# Patient Record
Sex: Female | Born: 1950 | Race: White | Hispanic: No | Marital: Married | Smoking: Never smoker
Health system: Southern US, Community
[De-identification: ages and names within clinical notes are randomized; demographics above are authoritative.]

## PROBLEM LIST (undated history)

## (undated) DIAGNOSIS — M549 Dorsalgia, unspecified: Secondary | ICD-10-CM

## (undated) DIAGNOSIS — Z8489 Family history of other specified conditions: Secondary | ICD-10-CM

## (undated) DIAGNOSIS — M4316 Spondylolisthesis, lumbar region: Secondary | ICD-10-CM

## (undated) DIAGNOSIS — K589 Irritable bowel syndrome without diarrhea: Secondary | ICD-10-CM

## (undated) DIAGNOSIS — I951 Orthostatic hypotension: Secondary | ICD-10-CM

## (undated) DIAGNOSIS — R5382 Chronic fatigue, unspecified: Secondary | ICD-10-CM

## (undated) DIAGNOSIS — F329 Major depressive disorder, single episode, unspecified: Secondary | ICD-10-CM

## (undated) DIAGNOSIS — J189 Pneumonia, unspecified organism: Secondary | ICD-10-CM

## (undated) DIAGNOSIS — F32A Depression, unspecified: Secondary | ICD-10-CM

## (undated) DIAGNOSIS — M797 Fibromyalgia: Secondary | ICD-10-CM

## (undated) DIAGNOSIS — G43909 Migraine, unspecified, not intractable, without status migrainosus: Secondary | ICD-10-CM

## (undated) DIAGNOSIS — M1990 Unspecified osteoarthritis, unspecified site: Secondary | ICD-10-CM

## (undated) DIAGNOSIS — C50919 Malignant neoplasm of unspecified site of unspecified female breast: Secondary | ICD-10-CM

## (undated) DIAGNOSIS — R112 Nausea with vomiting, unspecified: Secondary | ICD-10-CM

## (undated) DIAGNOSIS — M25569 Pain in unspecified knee: Secondary | ICD-10-CM

## (undated) DIAGNOSIS — J302 Other seasonal allergic rhinitis: Secondary | ICD-10-CM

## (undated) DIAGNOSIS — Z9889 Other specified postprocedural states: Secondary | ICD-10-CM

## (undated) DIAGNOSIS — I38 Endocarditis, valve unspecified: Secondary | ICD-10-CM

## (undated) DIAGNOSIS — R0600 Dyspnea, unspecified: Secondary | ICD-10-CM

## (undated) DIAGNOSIS — Z973 Presence of spectacles and contact lenses: Secondary | ICD-10-CM

## (undated) DIAGNOSIS — F419 Anxiety disorder, unspecified: Secondary | ICD-10-CM

## (undated) DIAGNOSIS — R609 Edema, unspecified: Secondary | ICD-10-CM

## (undated) DIAGNOSIS — G9332 Myalgic encephalomyelitis/chronic fatigue syndrome: Secondary | ICD-10-CM

## (undated) HISTORY — DX: Malignant neoplasm of unspecified site of unspecified female breast: C50.919

## (undated) HISTORY — PX: CHOLECYSTECTOMY: SHX55

## (undated) HISTORY — PX: OTHER SURGICAL HISTORY: SHX169

## (undated) HISTORY — PX: VARICOSE VEIN SURGERY: SHX832

## (undated) HISTORY — PX: ABDOMINAL HYSTERECTOMY: SHX81

## (undated) HISTORY — PX: HERNIA REPAIR: SHX51

## (undated) HISTORY — PX: OOPHORECTOMY: SHX86

---

## 2013-09-07 HISTORY — PX: BACK SURGERY: SHX140

## 2016-03-16 ENCOUNTER — Emergency Department (INDEPENDENT_AMBULATORY_CARE_PROVIDER_SITE_OTHER)
Admission: EM | Admit: 2016-03-16 | Discharge: 2016-03-16 | Disposition: A | Discharge status: Home / Self Care | Payer: PRIVATE HEALTH INSURANCE | Source: Home / Self Care | Attending: Family Medicine | Admitting: Family Medicine

## 2016-03-16 ENCOUNTER — Encounter: Payer: Self-pay | Admitting: *Deleted

## 2016-03-16 DIAGNOSIS — N390 Urinary tract infection, site not specified: Secondary | ICD-10-CM

## 2016-03-16 DIAGNOSIS — R300 Dysuria: Secondary | ICD-10-CM

## 2016-03-16 HISTORY — DX: Anxiety disorder, unspecified: F41.9

## 2016-03-16 HISTORY — DX: Myalgic encephalomyelitis/chronic fatigue syndrome: G93.32

## 2016-03-16 HISTORY — DX: Dorsalgia, unspecified: M54.9

## 2016-03-16 HISTORY — DX: Chronic fatigue, unspecified: R53.82

## 2016-03-16 HISTORY — DX: Migraine, unspecified, not intractable, without status migrainosus: G43.909

## 2016-03-16 HISTORY — DX: Major depressive disorder, single episode, unspecified: F32.9

## 2016-03-16 HISTORY — DX: Irritable bowel syndrome, unspecified: K58.9

## 2016-03-16 HISTORY — DX: Pain in unspecified knee: M25.569

## 2016-03-16 HISTORY — DX: Other seasonal allergic rhinitis: J30.2

## 2016-03-16 HISTORY — DX: Depression, unspecified: F32.A

## 2016-03-16 LAB — POCT URINALYSIS DIP (MANUAL ENTRY)
BILIRUBIN UA: NEGATIVE
GLUCOSE UA: NEGATIVE
Ketones, POC UA: NEGATIVE
NITRITE UA: NEGATIVE
Spec Grav, UA: 1.015 (ref 1.005–1.03)
Urobilinogen, UA: 0.2 (ref 0–1)
pH, UA: 6 (ref 5–8)

## 2016-03-16 MED ORDER — CEPHALEXIN 500 MG PO CAPS: 500.0000 mg | ORAL_CAPSULE | Freq: Two times a day (BID) | ORAL | Status: DC

## 2016-03-16 NOTE — ED Notes (Signed)
Pt c/o dysuria and urinary pressure x 2 wks with hematuria x today. Denies fever. No OTC meds.

## 2016-03-16 NOTE — ED Provider Notes (Signed)
CSN: [id number]     Arrival date & time 03/16/16  1349 History   First MD Initiated Contact with Patient 03/16/16 1420     Chief Complaint  Patient presents with  . Dysuria   (Consider location/radiation/quality/duration/timing/severity/associated sxs/prior Treatment) HPI Twanisha Mailey is a 65 y.o. female presenting to UC with c/o 2 weeks of gradually worsening dysuria, urinary frequency, lower back pain, and today started having hematuria. Hx of UTI only one other time about 3 months ago. Pt believed she was on azithromycin as the doses she took tapered down but she cannot recall for sure. She does have mild fatigue but also notes she just moved to area from [place], initially thought her fatigue was from the move.  Denies fever, chills, n/v/d.   Past Medical History  Diagnosis Date  . Migraine   . Back pain   . Knee pain   . Seasonal allergies   . Anxiety   . IBS (irritable bowel syndrome)   . Depression   . CFS (chronic fatigue syndrome)    Past Surgical History  Procedure Laterality Date  . Abdominal hysterectomy    . Oophorectomy    . Hernia repair    . Cholecystectomy     Family History  Problem Relation Age of Onset  . Mitral valve prolapse Mother   . Coronary aneurysm Father   . Diabetes Father    Social History  Substance Use Topics  . Smoking status: Never Smoker   . Smokeless tobacco: None  . Alcohol Use: No   OB History    No data available     Review of Systems  Constitutional: Positive for fatigue. Negative for fever and chills.  Gastrointestinal: Negative for nausea, vomiting, abdominal pain and diarrhea.  Genitourinary: Positive for dysuria, urgency, frequency, hematuria and pelvic pain ( "pressure"). Negative for flank pain, decreased urine volume, vaginal bleeding, vaginal discharge and vaginal pain.  Musculoskeletal: Positive for back pain. Negative for myalgias.    Allergies  Demerol and Augmentin  Home Medications   Prior to Admission  medications   Medication Sig Start Date End Date Taking? Authorizing Provider  ARIPiprazole (ABILIFY) 2 MG tablet Take 2 mg by mouth daily.   Yes Historical Provider, MD  BIOTIN 5000 PO Take by mouth.   Yes Historical Provider, MD  buPROPion (WELLBUTRIN SR) 150 MG 12 hr tablet Take 150 mg by mouth 2 (two) times daily.   Yes Historical Provider, MD  cetirizine (ZYRTEC) 10 MG tablet Take 10 mg by mouth daily.   Yes Historical Provider, MD  clonazePAM (KLONOPIN) 0.5 MG tablet Take 0.5 mg by mouth 2 (two) times daily as needed for anxiety.   Yes Historical Provider, MD  linaclotide (LINZESS) 145 MCG CAPS capsule Take 145 mcg by mouth daily before breakfast.   Yes Historical Provider, MD  metaxalone (SKELAXIN) 800 MG tablet Take 800 mg by mouth 3 (three) times daily.   Yes Historical Provider, MD  naproxen sodium (ANAPROX) 220 MG tablet Take 220 mg by mouth 2 (two) times daily with a meal.   Yes Historical Provider, MD  potassium chloride (MICRO-K) 10 MEQ CR capsule Take 10 mEq by mouth 2 (two) times daily.   Yes Historical Provider, MD  pregabalin (LYRICA) 150 MG capsule Take 150 mg by mouth 2 (two) times daily.   Yes Historical Provider, MD  topiramate (TOPAMAX) 50 MG tablet Take 50 mg by mouth 2 (two) times daily.   Yes Historical Provider, MD  traMADol Veatrice Bourbon) 50  MG tablet Take by mouth every 6 (six) hours as needed.   Yes Historical Provider, MD  triamterene-hydrochlorothiazide (DYAZIDE) 37.5-25 MG capsule Take 1 capsule by mouth daily.   Yes Historical Provider, MD  cephALEXin (KEFLEX) 500 MG capsule Take 1 capsule (500 mg total) by mouth 2 (two) times daily. For 7 days 03/16/16   Noland Fordyce, PA-C   Meds Ordered and Administered this Visit  Medications - No data to display  BP 112/73 mmHg  Pulse 79  Temp(Src) 97.7 F (36.5 C) (Oral)  Resp 18  Ht 5\' 6"  (1.676 m)  Wt 200 lb (90.719 kg)  BMI 32.30 kg/m2  SpO2 99% No data found.   Physical Exam  Constitutional: She is oriented to  person, place, and time. She appears well-developed and well-nourished. No distress.  HENT:  Head: Normocephalic and atraumatic.  Mouth/Throat: Oropharynx is clear and moist.  Eyes: EOM are normal.  Neck: Normal range of motion.  Cardiovascular: Normal rate, regular rhythm and normal heart sounds.   Pulmonary/Chest: Effort normal and breath sounds normal. No respiratory distress. She has no wheezes. She has no rales.  Abdominal: Soft. She exhibits no distension and no mass. There is no tenderness. There is no rebound, no guarding and no CVA tenderness.  Musculoskeletal: Normal range of motion.  Neurological: She is alert and oriented to person, place, and time.  Skin: Skin is warm and dry. She is not diaphoretic.  Psychiatric: She has a normal mood and affect. Her behavior is normal.  Nursing note and vitals reviewed.   ED Course  Procedures (including critical care time)  Labs Review Labs Reviewed  POCT URINALYSIS DIP (MANUAL ENTRY) - Abnormal; Notable for the following:    Clarity, UA cloudy (*)    Blood, UA moderate (*)    Protein Ur, POC trace (*)    Leukocytes, UA moderate (2+) (*)    All other components within normal limits  URINE CULTURE    Imaging Review No results found.    MDM   1. Dysuria   2. UTI (lower urinary tract infection)    Pt c/o 2 weeks of worsening UTI symptoms.  UA c/w UTI  Rx: keflex  Encouraged good hydration. May take OTC Azo. F/u in 4-5 days if not improving, sooner if worsening. Patient verbalized understanding and agreement with treatment plan.     Noland Fordyce, PA-C 03/16/16 1437

## 2016-03-16 NOTE — Discharge Instructions (Signed)
°

## 2016-03-19 ENCOUNTER — Telehealth: Payer: Self-pay | Admitting: *Deleted

## 2016-03-19 LAB — URINE CULTURE: Colony Count: >=100000

## 2016-03-19 NOTE — Telephone Encounter (Signed)
Callback: Pt reports she is much improved. Notified of UCX results, encouraged to complete antibiotic.

## 2016-03-31 DIAGNOSIS — R002 Palpitations: Secondary | ICD-10-CM | POA: Insufficient documentation

## 2016-03-31 DIAGNOSIS — R079 Chest pain, unspecified: Secondary | ICD-10-CM | POA: Insufficient documentation

## 2016-03-31 DIAGNOSIS — R0602 Shortness of breath: Secondary | ICD-10-CM | POA: Insufficient documentation

## 2016-04-01 DIAGNOSIS — M48061 Spinal stenosis, lumbar region without neurogenic claudication: Secondary | ICD-10-CM | POA: Insufficient documentation

## 2016-04-01 DIAGNOSIS — F339 Major depressive disorder, recurrent, unspecified: Secondary | ICD-10-CM | POA: Insufficient documentation

## 2016-04-01 DIAGNOSIS — M1712 Unilateral primary osteoarthritis, left knee: Secondary | ICD-10-CM | POA: Insufficient documentation

## 2016-04-01 DIAGNOSIS — F419 Anxiety disorder, unspecified: Secondary | ICD-10-CM | POA: Insufficient documentation

## 2016-04-01 DIAGNOSIS — R5382 Chronic fatigue, unspecified: Secondary | ICD-10-CM | POA: Insufficient documentation

## 2016-04-01 DIAGNOSIS — K581 Irritable bowel syndrome with constipation: Secondary | ICD-10-CM | POA: Insufficient documentation

## 2016-04-27 DIAGNOSIS — M797 Fibromyalgia: Secondary | ICD-10-CM | POA: Insufficient documentation

## 2016-07-14 ENCOUNTER — Ambulatory Visit (INDEPENDENT_AMBULATORY_CARE_PROVIDER_SITE_OTHER): Payer: PRIVATE HEALTH INSURANCE | Admitting: Podiatry

## 2016-07-14 ENCOUNTER — Encounter: Payer: Self-pay | Admitting: Podiatry

## 2016-07-14 DIAGNOSIS — M216X9 Other acquired deformities of unspecified foot: Secondary | ICD-10-CM

## 2016-07-14 DIAGNOSIS — Q828 Other specified congenital malformations of skin: Secondary | ICD-10-CM | POA: Diagnosis not present

## 2016-07-14 NOTE — Progress Notes (Signed)
   Subjective:    Patient ID: Lisa Morrison, female    DOB: [birth year], 65 y.o.   MRN: [id number]  HPI  65 year old female presents the office they for concerns of painful callus to both the left side worse than the right. She states that she just moved tear from [place] and she has been treated every 6 weeks for calluses. She states they've gotten very thick on the left side and is painful to walk. Denies any redness or drainage or swelling to her feet. No other complaints at this time.  Review of Systems  All other systems reviewed and are negative.      Objective:   Physical Exam General: AAO x3, NAD  Dermatological: On the left foot symmetrical 3 is a annular, deep hyperkeratotic lesion consistent with a porokeratosis. Upon debridement there is no underlying ulceration, drainage or any signs of infection. On the right foot there is diffuse thin hyperkeratotic tissue on the right foot submetatarsal 2/3. No underlying ulceration. Other open lesions or pre-ulcerative lesions.  Vascular: Dorsalis Pedis artery and Posterior Tibial artery pedal pulses are 2/4 bilateral with immedate capillary fill time.  There is no pain with calf compression, swelling, warmth, erythema.   Neruologic: Grossly intact via light touch bilateral. Vibratory intact via tuning fork bilateral. Protective threshold with Semmes Wienstein monofilament intact to all pedal sites bilateral.   Musculoskeletal: Prominent metatarsal heads plantarly with atrophy of the fat pad. No pain, crepitus, or limitation noted with foot and ankle range of motion bilateral. Muscular strength 5/5 in all groups tested bilateral.  Gait: Unassisted, Nonantalgic.      Assessment & Plan:  65 year old female symptomatically porokeratosis left foot due to prominent metatarsal heads. -Treatment options discussed including all alternatives, risks, and complications -Etiology of symptoms were discussed -Keratotic lesions were debrided 2  without, occasions or bleeding. On the left with area was cleaned and a pad was placed followed by salicylic acid and a bandage. Post procedure structures were discussed. Monitor for infection. Dispensed metatarsal offloading pads. She has urea cream at home and recommended to use this as well. -Follow-up as scheduled or sooner if any problems arise. In the meantime, encouraged to call the office with any questions, concerns, change in symptoms.   Celesta Gentile, DPM

## 2016-08-11 ENCOUNTER — Ambulatory Visit: Payer: PRIVATE HEALTH INSURANCE | Admitting: Podiatry

## 2016-09-08 ENCOUNTER — Encounter: Payer: Self-pay | Admitting: Podiatry

## 2016-09-08 ENCOUNTER — Ambulatory Visit (INDEPENDENT_AMBULATORY_CARE_PROVIDER_SITE_OTHER): Payer: PRIVATE HEALTH INSURANCE | Admitting: Podiatry

## 2016-09-08 DIAGNOSIS — Q828 Other specified congenital malformations of skin: Secondary | ICD-10-CM

## 2016-09-08 NOTE — Progress Notes (Signed)
Subjective: 66 year old female presents the office they for reoccurring calluses of the left foot. She had a cancer her last appointment and she had pneumonia. She recently moved. From [place] where she was given the callus trimmed every 4-5 weeks. She states that after calluses are trimmed she feels much better. She has tried changing shoes, padding without any significant relief. Denies any systemic complaints such as fevers, chills, nausea, vomiting. No acute changes since last appointment, and no other complaints at this time.   Objective: AAO x3, NAD DP/PT pulses palpable bilaterally, CRT less than 3 seconds Here is prominent metatarsal heads plantarly with atrophy of the fat pad. Hyperkeratotic tissue is present left foot submetatarsal 3. Upon debridement there is no underlying ulceration, drainage or any signs of infection. There is no other open lesions or pre-ulcerative lesions identified today. Tenderness over the hyperkeratotic lesion but no other areas of tenderness. No pain with calf compression, swelling, warmth, erythema  Assessment: Left foot porokeratosis  Plan: -All treatment options discussed with the patient including all alternatives, risks, complications.  -Lesion sharply debrided without complications or bleeding. Area was cleaned followed by pad and salinocaine. Post procedure instructions were discussed. Monitor for infection. -Follow-up in 4 weeks or sooner if needed. Call any questions or concerns.  Celesta Gentile, DPM

## 2016-09-30 DIAGNOSIS — M5136 Other intervertebral disc degeneration, lumbar region: Secondary | ICD-10-CM | POA: Insufficient documentation

## 2016-09-30 DIAGNOSIS — Z77090 Contact with and (suspected) exposure to asbestos: Secondary | ICD-10-CM | POA: Insufficient documentation

## 2016-09-30 DIAGNOSIS — E782 Mixed hyperlipidemia: Secondary | ICD-10-CM | POA: Insufficient documentation

## 2016-09-30 DIAGNOSIS — M51369 Other intervertebral disc degeneration, lumbar region without mention of lumbar back pain or lower extremity pain: Secondary | ICD-10-CM | POA: Insufficient documentation

## 2016-09-30 DIAGNOSIS — R739 Hyperglycemia, unspecified: Secondary | ICD-10-CM | POA: Insufficient documentation

## 2016-10-06 ENCOUNTER — Ambulatory Visit (INDEPENDENT_AMBULATORY_CARE_PROVIDER_SITE_OTHER): Payer: PRIVATE HEALTH INSURANCE | Admitting: Podiatry

## 2016-10-06 ENCOUNTER — Ambulatory Visit: Payer: PRIVATE HEALTH INSURANCE | Admitting: Podiatry

## 2016-10-06 ENCOUNTER — Encounter: Payer: Self-pay | Admitting: Podiatry

## 2016-10-06 DIAGNOSIS — Q828 Other specified congenital malformations of skin: Secondary | ICD-10-CM | POA: Diagnosis not present

## 2016-10-06 NOTE — Progress Notes (Signed)
Subjective: 66 year old female presents the office they for reoccurring calluses of the left foot.She denies any swelling or redness/drainage. Denies any systemic complaints such as fevers, chills, nausea, vomiting. No acute changes since last appointment, and no other complaints at this time.   Objective: AAO x3, NAD DP/PT pulses palpable bilaterally, CRT less than 3 seconds There is prominence of metatarsal heads plantarly with atrophy of the fat pad. Hyperkeratotic tissue is present left foot submetatarsal 3. Upon debridement there is no underlying ulceration, drainage or any signs of infection. There is no other open lesions or pre-ulcerative lesions identified today. Tenderness over the hyperkeratotic lesion but no other areas of tenderness. No pain with calf compression, swelling, warmth, erythema  Assessment: Left foot porokeratosis  Plan: -All treatment options discussed with the patient including all alternatives, risks, complications.  -Lesion sharply debrided without complications or bleeding. Area was cleaned followed by a pad and salinocaine was applied followed by a bandage. Post-procedure instructions disucssed.  -Daily foot inspection -Follow-up in 4 weeks or sooner if needed. Call any questions or concerns.  Celesta Gentile, DPM

## 2016-11-10 ENCOUNTER — Encounter: Payer: Self-pay | Admitting: Podiatry

## 2016-11-10 ENCOUNTER — Ambulatory Visit (INDEPENDENT_AMBULATORY_CARE_PROVIDER_SITE_OTHER): Payer: PRIVATE HEALTH INSURANCE | Admitting: Podiatry

## 2016-11-10 DIAGNOSIS — M79672 Pain in left foot: Secondary | ICD-10-CM | POA: Diagnosis not present

## 2016-11-10 DIAGNOSIS — Q828 Other specified congenital malformations of skin: Secondary | ICD-10-CM

## 2016-11-16 NOTE — Progress Notes (Signed)
Subjective: 66 year old female presents the office they for reoccurring calluses of the left foot.She denies any swelling or redness/drainage. Denies any systemic complaints such as fevers, chills, nausea, vomiting. No acute changes since last appointment, and no other complaints at this time.   Objective: AAO x3, NAD DP/PT pulses palpable bilaterally, CRT less than 3 seconds There is prominence of metatarsal heads plantarly with atrophy of the fat pad. Hyperkeratotic tissue is present left foot submetatarsal 3. Upon debridement there is no underlying ulceration, drainage or any signs of infection. There is no other open lesions or pre-ulcerative lesions identified today. Tenderness over the hyperkeratotic lesion but no other areas of tenderness. No pain with calf compression, swelling, warmth, erythema  Assessment: Left foot porokeratosis  Plan: -All treatment options discussed with the patient including all alternatives, risks, complications.  -Lesion sharply debrided without complications or bleeding. Area was cleaned followed by a pad and salinocaine was applied followed by a bandage. Post-procedure instructions disucssed.  -Daily foot inspection -Follow-up in 5 weeks or sooner if needed. Call any questions or concerns.  Celesta Gentile, DPM

## 2016-12-15 ENCOUNTER — Encounter: Payer: Self-pay | Admitting: Podiatry

## 2016-12-15 ENCOUNTER — Ambulatory Visit (INDEPENDENT_AMBULATORY_CARE_PROVIDER_SITE_OTHER): Payer: PRIVATE HEALTH INSURANCE | Admitting: Podiatry

## 2016-12-15 DIAGNOSIS — Q828 Other specified congenital malformations of skin: Secondary | ICD-10-CM | POA: Diagnosis not present

## 2016-12-15 NOTE — Progress Notes (Signed)
Subjective: 66 year old female presents the office they for reoccurring calluses of the left foot.she states that it is not hurting as bad but it starting to build up again and she does not want to wait until it gets to be more thick. She denies any swelling or redness/drainage. Denies any systemic complaints such as fevers, chills, nausea, vomiting. No acute changes since last appointment, and no other complaints at this time.   Objective: AAO x3, NAD DP/PT pulses palpable bilaterally, CRT less than 3 seconds There is prominence of metatarsal heads plantarly with atrophy of the fat pad. Hyperkeratotic tissue is present left foot submetatarsal 3. Upon debridement there is no underlying ulceration, drainage or any signs of infection. There is no other open lesions or pre-ulcerative lesions identified today. Tenderness over the hyperkeratotic lesion but no other areas of tenderness. No pain with calf compression, swelling, warmth, erythema  Assessment: Left foot porokeratosis  Plan: -All treatment options discussed with the patient including all alternatives, risks, complications.  -Lesion sharply debrided without complications or bleeding. Offloading pads.  -Daily foot inspection -Follow-up in 6 weeks or sooner if needed. Call any questions or concerns.  Celesta Gentile, DPM

## 2017-02-02 ENCOUNTER — Ambulatory Visit (INDEPENDENT_AMBULATORY_CARE_PROVIDER_SITE_OTHER): Payer: PRIVATE HEALTH INSURANCE | Admitting: Podiatry

## 2017-02-02 ENCOUNTER — Encounter: Payer: Self-pay | Admitting: Podiatry

## 2017-02-02 DIAGNOSIS — M79672 Pain in left foot: Secondary | ICD-10-CM

## 2017-02-02 DIAGNOSIS — Q828 Other specified congenital malformations of skin: Secondary | ICD-10-CM

## 2017-02-02 NOTE — Progress Notes (Signed)
Subjective: 66 year old female presents the office they for reoccurring calluses of the left foot. She said the calluses trimmed of thick again and started to get painful. Denies any redness or drainage or any swelling. She has no new concerns. Denies any systemic complaints such as fevers, chills, nausea, vomiting. No acute changes since last appointment, and no other complaints at this time.   Objective: AAO x3, NAD DP/PT pulses palpable bilaterally, CRT less than 3 seconds There is prominence of metatarsal heads plantarly with atrophy of the fat pad. Hyperkeratotic tissue is present left foot submetatarsal 3. Upon debridement there is no underlying ulceration, drainage or any signs of infection. There is no other open lesions or pre-ulcerative lesions identified today. Tenderness over the hyperkeratotic lesion but no other areas of tenderness. No pain with calf compression, swelling, warmth, erythema  Assessment: Left foot porokeratosis  Plan: -All treatment options discussed with the patient including all alternatives, risks, complications.  -Lesion sharply debrided without complications or bleeding. Offloading pads.  -Daily foot inspection -Follow-up in 5 weeks at her request or sooner if needed. Call any questions or concerns.  Celesta Gentile, DPM

## 2017-03-23 ENCOUNTER — Ambulatory Visit (INDEPENDENT_AMBULATORY_CARE_PROVIDER_SITE_OTHER): Payer: PRIVATE HEALTH INSURANCE | Admitting: Podiatry

## 2017-03-23 ENCOUNTER — Encounter: Payer: Self-pay | Admitting: Podiatry

## 2017-03-23 DIAGNOSIS — Q828 Other specified congenital malformations of skin: Secondary | ICD-10-CM

## 2017-03-23 DIAGNOSIS — M79672 Pain in left foot: Secondary | ICD-10-CM

## 2017-03-23 NOTE — Progress Notes (Signed)
Subjective: 66 year old female presents the office they for reoccurring calluses of the left foot that has again become painful. Denies any redness or drainage or any swelling. She has no new concerns. Denies any systemic complaints such as fevers, chills, nausea, vomiting. No acute changes since last appointment, and no other complaints at this time.   Objective: AAO x3, NAD DP/PT pulses palpable bilaterally, CRT less than 3 seconds There is prominence of metatarsal heads plantarly with atrophy of the fat pad. Hyperkeratotic tissue is present left foot submetatarsal 3. Upon debridement there is no underlying ulceration, drainage or any signs of infection. There is no other open lesions or pre-ulcerative lesions identified today. Tenderness over the hyperkeratotic lesion but no other areas of tenderness. No pain with calf compression, swelling, warmth, erythema  Assessment: Left foot porokeratosis  Plan: -All treatment options discussed with the patient including all alternatives, risks, complications.  -Lesion sharply debrided without complications or bleeding. Offloading pads.  -Daily foot inspection -Follow-up in 5 weeks at her request or sooner if needed. Call any questions or concerns.  Celesta Gentile, DPM

## 2017-05-04 ENCOUNTER — Encounter: Payer: Self-pay | Admitting: Podiatry

## 2017-05-04 ENCOUNTER — Ambulatory Visit (INDEPENDENT_AMBULATORY_CARE_PROVIDER_SITE_OTHER): Payer: PRIVATE HEALTH INSURANCE | Admitting: Podiatry

## 2017-05-04 DIAGNOSIS — Q828 Other specified congenital malformations of skin: Secondary | ICD-10-CM | POA: Diagnosis not present

## 2017-05-04 NOTE — Progress Notes (Signed)
Subjective: 66 year old female presents the office they for reoccurring calluses of the left foot that has again become painful. Denies any redness or drainage or any swelling. She has no new concerns. Denies any systemic complaints such as fevers, chills, nausea, vomiting. No acute changes since last appointment, and no other complaints at this time.   Objective: AAO x3, NAD DP/PT pulses palpable bilaterally, CRT less than 3 seconds There is prominence of metatarsal heads plantarly with atrophy of the fat pad. Hyperkeratotic tissue is present left foot submetatarsal 3. Upon debridement there is no underlying ulceration, drainage or any signs of infection. There is no other open lesions or pre-ulcerative lesions identified today. Tenderness over the hyperkeratotic lesion but no other areas of tenderness. No pain with calf compression, swelling, warmth, erythema  Assessment: Left foot porokeratosis  Plan: -All treatment options discussed with the patient including all alternatives, risks, complications.  -Lesion sharply debrided. Offloading pads.  -Daily foot inspection -Follow-up in 5 weeks at her request or sooner if needed. Call any questions or concerns.  Celesta Gentile, DPM

## 2017-06-15 ENCOUNTER — Ambulatory Visit: Payer: PRIVATE HEALTH INSURANCE | Admitting: Podiatry

## 2017-06-29 ENCOUNTER — Ambulatory Visit (INDEPENDENT_AMBULATORY_CARE_PROVIDER_SITE_OTHER): Payer: PRIVATE HEALTH INSURANCE | Admitting: Podiatry

## 2017-06-29 ENCOUNTER — Encounter: Payer: Self-pay | Admitting: Podiatry

## 2017-06-29 ENCOUNTER — Ambulatory Visit: Payer: PRIVATE HEALTH INSURANCE | Admitting: Podiatry

## 2017-06-29 DIAGNOSIS — Q828 Other specified congenital malformations of skin: Secondary | ICD-10-CM | POA: Diagnosis not present

## 2017-06-29 NOTE — Progress Notes (Signed)
Subjective: 66 year old female presents the office they for reoccurring calluses of the left foot that has again become painful. Denies any redness or drainage or any swelling. She has no new concerns. Denies any systemic complaints such as fevers, chills, nausea, vomiting. No acute changes since last appointment, and no other complaints at this time.   Objective: AAO x3, NAD DP/PT pulses palpable bilaterally, CRT less than 3 seconds There is prominence of metatarsal heads plantarly with atrophy of the fat pad. Hyperkeratotic tissue is present left foot submetatarsal 3. Upon debridement there is no underlying ulceration, drainage or any signs of infection. There is no other open lesions or pre-ulcerative lesions identified today. Tenderness over the hyperkeratotic lesion but no other areas of tenderness. No pain with calf compression, swelling, warmth, erythema  Assessment: Left foot porokeratosis  Plan: -All treatment options discussed with the patient including all alternatives, risks, complications.  -Lesion sharply debrided. X 1 without complications or bleeding. Offloading pads.  -Daily foot inspection -Follow-up in 5 weeks at her request or sooner if needed. Call any questions or concerns.  Celesta Gentile, DPM

## 2017-08-10 ENCOUNTER — Ambulatory Visit (INDEPENDENT_AMBULATORY_CARE_PROVIDER_SITE_OTHER): Payer: MEDICARE | Admitting: Podiatry

## 2017-08-10 DIAGNOSIS — Q828 Other specified congenital malformations of skin: Secondary | ICD-10-CM | POA: Diagnosis not present

## 2017-08-10 NOTE — Progress Notes (Signed)
Subjective: 66 year old female presents the office they for reoccurring calluses of the left foot that has again become painful. Denies any redness or drainage or any swelling. She has no new concerns. Denies any systemic complaints such as fevers, chills, nausea, vomiting. No acute changes since last appointment, and no other complaints at this time.   Objective: AAO x3, NAD DP/PT pulses palpable bilaterally, CRT less than 3 seconds There is prominence of metatarsal heads plantarly with atrophy of the fat pad. Hyperkeratotic tissue is present left foot submetatarsal 3. Upon debridement there is no underlying ulceration, drainage or any signs of infection. There is no other open lesions or pre-ulcerative lesions identified today. Tenderness over the hyperkeratotic lesion but no other areas of tenderness. No pain with calf compression, swelling, warmth, erythema No acute changes otherwsie  Assessment: Left foot porokeratosis  Plan: -All treatment options discussed with the patient including all alternatives, risks, complications.  -Lesion sharply debrided. X 1 without complications or bleeding. Offloading pads.  -Daily foot inspection -Follow-up in 5 weeks at her request or sooner if needed. Call any questions or concerns.  Celesta Gentile, DPM

## 2017-09-28 ENCOUNTER — Ambulatory Visit: Payer: PRIVATE HEALTH INSURANCE | Admitting: Podiatry

## 2017-10-05 ENCOUNTER — Ambulatory Visit: Payer: PRIVATE HEALTH INSURANCE | Admitting: Podiatry

## 2017-10-12 ENCOUNTER — Ambulatory Visit: Payer: PRIVATE HEALTH INSURANCE | Admitting: Podiatry

## 2017-11-02 ENCOUNTER — Encounter: Payer: Self-pay | Admitting: Podiatry

## 2017-11-02 ENCOUNTER — Ambulatory Visit: Payer: Medicare PPO | Admitting: Podiatry

## 2017-11-02 DIAGNOSIS — M79676 Pain in unspecified toe(s): Secondary | ICD-10-CM

## 2017-11-02 DIAGNOSIS — Q828 Other specified congenital malformations of skin: Secondary | ICD-10-CM

## 2017-11-03 NOTE — Progress Notes (Signed)
Subjective: 67 year old female presents the office they for reoccurring calluses of the left foot that has again become painful. Denies any redness or drainage or any swelling. She has no new concerns. Denies any systemic complaints such as fevers, chills, nausea, vomiting. No acute changes since last appointment, and no other complaints at this time.   Objective: AAO x3, NAD DP/PT pulses palpable bilaterally, CRT less than 3 seconds There is prominence of metatarsal heads plantarly with atrophy of the fat pad. Hyperkeratotic tissue is present left foot submetatarsal 3. Upon debridement there is no underlying ulceration, drainage or any signs of infection. There is no other open lesions or pre-ulcerative lesions identified today. Tenderness over the hyperkeratotic lesion but no other areas of tenderness. No pain with calf compression, swelling, warmth, erythema No acute changes otherwsie  Assessment: Left foot porokeratosis  Plan: -All treatment options discussed with the patient including all alternatives, risks, complications.  -Lesion sharply debrided. X 1 without complications or bleeding. Offloading pads.  -Daily foot inspection -Follow-up in 9 weeks at her request or sooner if needed. Call any questions or concerns.  Celesta Gentile, DPM

## 2017-11-16 ENCOUNTER — Telehealth: Payer: Self-pay | Admitting: *Deleted

## 2017-11-16 NOTE — Telephone Encounter (Signed)
Pt states she received a notification from her insurance stating they would not be covering her treatments unless she is diabetic or they were deemed medically necessary. Pt states Dr. Jacqualyn Posey had said there may be a way that he would be able to perform the service under the $40.00 co-pay. I told pt that I would message Dr. Jacqualyn Posey and Elmer Bales and get back with her. Pt states she has already spoken with Jocelyn Lamer, and was told she could not do anything else.

## 2017-11-16 NOTE — Telephone Encounter (Signed)
Pt states she cant leave a detailed message.

## 2017-11-16 NOTE — Telephone Encounter (Signed)
Left message stating I take care of all of the doctors' pt calls and if she would call and leave a message with her question or concern, often I can call back with the answer, to call again.

## 2017-11-16 NOTE — Telephone Encounter (Signed)
Entered in error

## 2017-11-16 NOTE — Telephone Encounter (Signed)
Pt called to speak with Dr. Leigh Aurora nurse.

## 2017-11-17 NOTE — Telephone Encounter (Signed)
Jocelyn Lamer-   Remind me to talk to you about this when I get back to the office.

## 2017-12-27 ENCOUNTER — Telehealth: Payer: Self-pay | Admitting: Podiatry

## 2017-12-27 NOTE — Telephone Encounter (Signed)
I had talked to her about billing just an office visit. Not sure if that is any cheper than just the callus trim. Can we check to see what the cost of the service is just to trim one callus??

## 2017-12-27 NOTE — Telephone Encounter (Signed)
She is being billed the $ 150 and owes 110 after copay.

## 2017-12-27 NOTE — Telephone Encounter (Signed)
Pt called and was asking about a letter of medical necessity for the trimming of the calluses (routine foot care). Per BB we do not do them for this non covered service.  Pt stated to let you know she has cxled her appt due to the cost of the service. She said at one time you told her you would only bill her for the amount of her copay which is $40.00 and she is being billed

## 2018-01-04 ENCOUNTER — Ambulatory Visit: Payer: Medicare PPO | Admitting: Podiatry

## 2018-01-04 ENCOUNTER — Telehealth: Payer: Self-pay | Admitting: *Deleted

## 2018-01-04 NOTE — Telephone Encounter (Signed)
Pt asked if we have letter of medical necessity or what can we do to get the callous trim to a manageable cost.

## 2018-01-05 ENCOUNTER — Encounter: Payer: Self-pay | Admitting: Podiatry

## 2018-01-05 ENCOUNTER — Telehealth: Payer: Self-pay | Admitting: Podiatry

## 2018-01-05 NOTE — Telephone Encounter (Signed)
This message is for [facility]. I'm sorry, I was tied up in a meeting and could not take your call. I'm available the rest of the day. If you could call me back at [phone]. I'm anxiously waiting to hear from you. Thank you Valery. Bye bye.

## 2018-01-05 NOTE — Telephone Encounter (Signed)
-----   Message from Karsten Ro sent at 01/05/2018  8:57 AM EDT ----- Bonne Dolores to call patient. Phone # in [place] just rings funny busy signal. If she calls back we will tell her we can bill out office visit only, not the procedure.

## 2018-01-05 NOTE — Telephone Encounter (Signed)
Left message on mobile to call me for information concerning coverage. Unable to leave a message on home phone line was busy.

## 2018-01-05 NOTE — Telephone Encounter (Signed)
Lisa Morrison, can you help me out with this? I think the best way is just an office visit charge. I did write a letter of medical necessity and it is in her chart. I think it is getting confusion because she has talked to so many people in our office.

## 2018-01-06 NOTE — Telephone Encounter (Signed)
Lisa Morrison states she spoke with pt and they determined it would be most cost effective for pt to pay the $40.00 co-pay at check in.

## 2018-01-21 ENCOUNTER — Ambulatory Visit: Payer: Medicare PPO | Admitting: Podiatry

## 2018-01-21 ENCOUNTER — Encounter: Payer: Self-pay | Admitting: Podiatry

## 2018-01-21 DIAGNOSIS — M79672 Pain in left foot: Secondary | ICD-10-CM | POA: Diagnosis not present

## 2018-01-21 DIAGNOSIS — Q828 Other specified congenital malformations of skin: Secondary | ICD-10-CM | POA: Diagnosis not present

## 2018-01-24 NOTE — Progress Notes (Signed)
Subjective: 67 year old female presents the office they for reoccurring calluses of the left foot that has again become painful again.  She states that the callus that they can suggest a walker causes a lot of pressure and pain and limits her activity.  Denies any redness or drainage or any swelling. She has no new concerns. Denies any systemic complaints such as fevers, chills, nausea, vomiting. No acute changes since last appointment, and no other complaints at this time.   Objective: AAO x3, NAD DP/PT pulses palpable bilaterally, CRT less than 3 seconds There is prominence of metatarsal heads plantarly with atrophy of the fat pad. Hyperkeratotic tissue is present left foot submetatarsal 3. Upon debridement there is no underlying ulceration, drainage or any signs of infection. There is no other open lesions or pre-ulcerative lesions identified today. Tenderness over the hyperkeratotic lesion but no other areas of tenderness. No pain with calf compression, swelling, warmth, erythema No acute changes otherwsie  Assessment: Left foot porokeratosis  Plan: -All treatment options discussed with the patient including all alternatives, risks, complications.  -Lesion sharply debrided. X 1 without complications or bleeding. Offloading pads.  -Daily foot inspection -Follow-up in 5 weeks at her request or sooner if needed.  Call any questions or concerns.  Celesta Gentile, DPM

## 2018-02-21 ENCOUNTER — Ambulatory Visit: Payer: Medicare PPO | Admitting: Podiatry

## 2018-03-29 ENCOUNTER — Ambulatory Visit: Payer: Medicare PPO | Admitting: Podiatry

## 2018-03-29 ENCOUNTER — Encounter: Payer: Self-pay | Admitting: Podiatry

## 2018-03-29 DIAGNOSIS — Q828 Other specified congenital malformations of skin: Secondary | ICD-10-CM

## 2018-03-29 DIAGNOSIS — M79672 Pain in left foot: Secondary | ICD-10-CM

## 2018-03-30 NOTE — Progress Notes (Signed)
Subjective: 67 year old female presents the office they for reoccurring calluses of the left foot that has again become painful again.  She is employed with possible orthotics given her pain.  She has changed her shoes in the house which is been helpful in the calluses but less painful but it still continues.  She denies any redness.  Respond to the callus site.  She denies any recent injury or trauma.  She has no other concerns.    Objective: AAO x3, NAD DP/PT pulses palpable bilaterally, CRT less than 3 seconds There is prominence of metatarsal heads plantarly with atrophy of the fat pad. Hyperkeratotic tissue is present left foot submetatarsal 3. Upon debridement there is no underlying ulceration, drainage or any signs of infection. There is no other open lesions or pre-ulcerative lesions identified today. Tenderness over the hyperkeratotic lesion but no other areas of tenderness. No pain with calf compression, swelling, warmth, erythema No acute changes otherwsie  Assessment: Left foot porokeratosis  Plan: -All treatment options discussed with the patient including all alternatives, risks, complications.  -Lesion sharply debrided. X 1 without complications or bleeding. Offloading pads.  We discussed custom orthotics today as well as shoe modifications percent continue orthotics and she wants to do this I gave her the information for [facility] she can call the office to make a follow-up with him for this.  -Daily foot inspection  Celesta Gentile, DPM

## 2018-05-31 ENCOUNTER — Ambulatory Visit: Payer: Medicare PPO | Admitting: Podiatry

## 2018-05-31 ENCOUNTER — Encounter: Payer: Self-pay | Admitting: Podiatry

## 2018-05-31 DIAGNOSIS — M79672 Pain in left foot: Secondary | ICD-10-CM

## 2018-05-31 DIAGNOSIS — Q828 Other specified congenital malformations of skin: Secondary | ICD-10-CM

## 2018-06-01 NOTE — Progress Notes (Signed)
Subjective: 67 year old female presents the office they for reoccurring calluses of the left foot that has again become painful again. She has changed shoes which helps some. She denies any recent injury or trauma.  She has no other concerns.    Objective: AAO x3, NAD DP/PT pulses palpable bilaterally, CRT less than 3 seconds There is prominence of metatarsal heads plantarly with atrophy of the fat pad. Hyperkeratotic tissue is present left foot submetatarsal 3. Upon debridement there is no underlying ulceration, drainage or any signs of infection. There is no other open lesions or pre-ulcerative lesions identified today. Tenderness over the hyperkeratotic lesion but no other areas of tenderness. No pain with calf compression, swelling, warmth, erythema No acute changes otherwsie  Assessment: Left foot porokeratosis  Plan: -All treatment options discussed with the patient including all alternatives, risks, complications.  -Lesion sharply debrided. X 1 without complications or bleeding. Offloading pads.  Continue shoe modifications -Daily foot inspection  Celesta Gentile, DPM

## 2018-08-02 ENCOUNTER — Ambulatory Visit: Payer: Medicare PPO | Admitting: Podiatry

## 2018-08-02 ENCOUNTER — Encounter: Payer: Self-pay | Admitting: Podiatry

## 2018-08-02 DIAGNOSIS — Q828 Other specified congenital malformations of skin: Secondary | ICD-10-CM

## 2018-08-02 DIAGNOSIS — M79672 Pain in left foot: Secondary | ICD-10-CM

## 2018-08-02 NOTE — Progress Notes (Signed)
Subjective: 67 year old female presents the office they for reoccurring calluses of the left foot that has again become painful again. She has changed shoes which helps some. She denies any recent injury or trauma.  She has no other concerns.    Objective: AAO x3, NAD DP/PT pulses palpable bilaterally, CRT less than 3 seconds There is prominence of metatarsal heads plantarly with atrophy of the fat pad. Hyperkeratotic tissue is present left foot submetatarsal 3. Upon debridement there is no underlying ulceration, drainage or any signs of infection. There is no other open lesions or pre-ulcerative lesions identified today. Tenderness over the hyperkeratotic lesion but no other areas of tenderness. No pain with calf compression, swelling, warmth, erythema No acute changes otherwsie  Assessment: Left foot porokeratosis  Plan: -All treatment options discussed with the patient including all alternatives, risks, complications.  -Lesion sharply debrided. X 1 without complications or bleeding. Offloading pads.  Continue shoe modifications -Daily foot inspection  Celesta Gentile, DPM

## 2018-10-04 ENCOUNTER — Ambulatory Visit: Payer: Self-pay | Admitting: Podiatry

## 2018-10-04 DIAGNOSIS — Q828 Other specified congenital malformations of skin: Secondary | ICD-10-CM

## 2018-10-04 DIAGNOSIS — M79672 Pain in left foot: Secondary | ICD-10-CM

## 2018-10-04 NOTE — Progress Notes (Signed)
Subjective: 68 year old female presents the office they for reoccurring calluses of the left foot that has again become painful again. She has changed shoes which helps some. She denies any recent injury or trauma.  She has no other concerns.    Objective: AAO x3, NAD DP/PT pulses palpable bilaterally, CRT less than 3 seconds There is prominence of metatarsal heads plantarly with atrophy of the fat pad. Hyperkeratotic tissue is present left foot submetatarsal 3. Upon debridement there is no underlying ulceration, drainage or any signs of infection. There is no other open lesions or pre-ulcerative lesions identified today. Tenderness over the hyperkeratotic lesion but no other areas of tenderness. No pain with calf compression, swelling, warmth, erythema No acute changes otherwsie  Assessment: Left foot porokeratosis  Plan: -All treatment options discussed with the patient including all alternatives, risks, complications.  -Lesion sharply debrided. X 1 without complications or bleeding. Offloading pads.  Continue shoe modifications -Daily foot inspection  Celesta Gentile, DPM

## 2018-10-27 DIAGNOSIS — M47816 Spondylosis without myelopathy or radiculopathy, lumbar region: Secondary | ICD-10-CM | POA: Insufficient documentation

## 2018-11-08 ENCOUNTER — Other Ambulatory Visit: Payer: Self-pay | Admitting: Neurological Surgery

## 2018-11-17 ENCOUNTER — Encounter (HOSPITAL_COMMUNITY): Payer: Self-pay

## 2018-11-17 NOTE — Pre-Procedure Instructions (Signed)
Lisa Morrison  11/17/2018      [id number] - [place], [place] - [address] [place] [address] Phone: [phone] Fax: [phone]    Your procedure is scheduled on Mar. 23  Report to [facility] Entrance A at 8:30 A.M.  Call this number if you have problems the morning of surgery:  [phone]   Remember:  Do not eat or drink after midnight.      Take these medicines the morning of surgery with A SIP OF WATER :              Tylenol if needed              Albuterol--bring to hospital              amitiza              Bupropion (wellbutrin)              certirizine (zyrtec)              Clonazepam (klonopin)              7 days prior to surgery STOP taking any Aspirin (unless otherwise instructed by your surgeon), Aleve, Naproxen, Ibuprofen, Motrin, Advil, Goody's, BC's, all herbal medications, fish oil, and all vitamins.    Do not wear jewelry, make-up or nail polish.  Do not wear lotions, powders, or perfumes, or deodorant.  Do not shave 48 hours prior to surgery.  Men may shave face and neck.  Do not bring valuables to the hospital.  [facility] is not responsible for any belongings or valuables.  Contacts, dentures or bridgework may not be worn into surgery.  Leave your suitcase in the car.  After surgery it may be brought to your room.  For patients admitted to the hospital, discharge time will be determined by your treatment team.  Patients discharged the day of surgery will not be allowed to drive home.    Special instructions:  [place]- Preparing For Surgery  Before surgery, you can play an important role. Because skin is not sterile, your skin needs to be as free of germs as possible. You can reduce the number of germs on your skin by washing with CHG (chlorahexidine gluconate) Soap before surgery.  CHG is an antiseptic cleaner which kills germs and bonds with the skin to continue killing germs even after  washing.    Oral Hygiene is also important to reduce your risk of infection.  Remember - BRUSH YOUR TEETH THE MORNING OF SURGERY WITH YOUR REGULAR TOOTHPASTE  Please do not use if you have an allergy to CHG or antibacterial soaps. If your skin becomes reddened/irritated stop using the CHG.  Do not shave (including legs and underarms) for at least 48 hours prior to first CHG shower. It is OK to shave your face.  Please follow these instructions carefully.   1. Shower the NIGHT BEFORE SURGERY and the MORNING OF SURGERY with CHG.   2. If you chose to wash your hair, wash your hair first as usual with your normal shampoo.  3. After you shampoo, rinse your hair and body thoroughly to remove the shampoo.  4. Use CHG as you would any other liquid soap. You can apply CHG directly to the skin and wash gently with a scrungie or a clean washcloth.   5. Apply the CHG Soap to your body ONLY FROM THE NECK DOWN.  Do not use on open wounds or open sores. Avoid contact with your eyes, ears, mouth and genitals (private parts). Wash Face and genitals (private parts)  with your normal soap.  6. Wash thoroughly, paying special attention to the area where your surgery will be performed.  7. Thoroughly rinse your body with warm water from the neck down.  8. DO NOT shower/wash with your normal soap after using and rinsing off the CHG Soap.  9. Pat yourself dry with a CLEAN TOWEL.  10. Wear CLEAN PAJAMAS to bed the night before surgery, wear comfortable clothes the morning of surgery  11. Place CLEAN SHEETS on your bed the night of your first shower and DO NOT SLEEP WITH PETS.    Day of Surgery:  Do not apply any deodorants/lotions.  Please wear clean clothes to the hospital/surgery center.   Remember to brush your teeth WITH YOUR REGULAR TOOTHPASTE.    Please read over the following fact sheets that you were given. Coughing and Deep Breathing, MRSA Information and Surgical Site Infection  Prevention

## 2018-11-18 ENCOUNTER — Ambulatory Visit (HOSPITAL_COMMUNITY)
Admission: RE | Admit: 2018-11-18 | Discharge: 2018-11-18 | Disposition: A | Discharge status: Home / Self Care | Payer: Medicare PPO | Source: Ambulatory Visit | Attending: Neurological Surgery | Admitting: Neurological Surgery

## 2018-11-18 ENCOUNTER — Encounter (HOSPITAL_COMMUNITY): Payer: Self-pay

## 2018-11-18 ENCOUNTER — Encounter (HOSPITAL_COMMUNITY)
Admission: RE | Admit: 2018-11-18 | Discharge: 2018-11-18 | Disposition: A | Discharge status: Home / Self Care | Payer: Medicare PPO | Source: Ambulatory Visit | Attending: Neurological Surgery | Admitting: Neurological Surgery

## 2018-11-18 ENCOUNTER — Other Ambulatory Visit: Payer: Self-pay

## 2018-11-18 DIAGNOSIS — M4310 Spondylolisthesis, site unspecified: Secondary | ICD-10-CM | POA: Insufficient documentation

## 2018-11-18 DIAGNOSIS — Z01818 Encounter for other preprocedural examination: Secondary | ICD-10-CM | POA: Diagnosis not present

## 2018-11-18 HISTORY — DX: Orthostatic hypotension: I95.1

## 2018-11-18 HISTORY — DX: Nausea with vomiting, unspecified: R11.2

## 2018-11-18 HISTORY — DX: Family history of other specified conditions: Z84.89

## 2018-11-18 HISTORY — DX: Edema, unspecified: R60.9

## 2018-11-18 HISTORY — DX: Fibromyalgia: M79.7

## 2018-11-18 HISTORY — DX: Pneumonia, unspecified organism: J18.9

## 2018-11-18 HISTORY — DX: Other specified postprocedural states: Z98.890

## 2018-11-18 HISTORY — DX: Endocarditis, valve unspecified: I38

## 2018-11-18 HISTORY — DX: Dyspnea, unspecified: R06.00

## 2018-11-18 LAB — CBC WITH DIFFERENTIAL/PLATELET
Abs Immature Granulocytes: 0.02 10*3/uL (ref 0.00–0.07)
Basophils Absolute: 0 10*3/uL (ref 0.0–0.1)
Basophils Relative: 1 %
EOS ABS: 0.1 10*3/uL (ref 0.0–0.5)
EOS PCT: 2 %
HCT: 45.2 % (ref 36.0–46.0)
Hemoglobin: 14.3 g/dL (ref 12.0–15.0)
Immature Granulocytes: 0 %
Lymphocytes Relative: 32 %
Lymphs Abs: 2 10*3/uL (ref 0.7–4.0)
MCH: 29.2 pg (ref 26.0–34.0)
MCHC: 31.6 g/dL (ref 30.0–36.0)
MCV: 92.4 fL (ref 80.0–100.0)
Monocytes Absolute: 0.5 10*3/uL (ref 0.1–1.0)
Monocytes Relative: 8 %
Neutro Abs: 3.6 10*3/uL (ref 1.7–7.7)
Neutrophils Relative %: 57 %
Platelets: 256 10*3/uL (ref 150–400)
RBC: 4.89 MIL/uL (ref 3.87–5.11)
RDW: 13 % (ref 11.5–15.5)
WBC: 6.2 10*3/uL (ref 4.0–10.5)
nRBC: 0 % (ref 0.0–0.2)

## 2018-11-18 LAB — BASIC METABOLIC PANEL
Anion gap: 8 (ref 5–15)
BUN: 21 mg/dL (ref 8–23)
CO2: 27 mmol/L (ref 22–32)
CREATININE: 0.89 mg/dL (ref 0.44–1.00)
Calcium: 9.4 mg/dL (ref 8.9–10.3)
Chloride: 105 mmol/L (ref 98–111)
GFR calc Af Amer: >60 mL/min (ref >60)
GFR calc non Af Amer: >60 mL/min (ref >60)
Glucose, Bld: 100 mg/dL — ABNORMAL HIGH (ref 70–99)
Potassium: 3.4 mmol/L — ABNORMAL LOW (ref 3.5–5.1)
Sodium: 140 mmol/L (ref 135–145)

## 2018-11-18 LAB — PROTIME-INR
INR: 0.9 (ref 0.8–1.2)
Prothrombin Time: 12.4 seconds (ref 11.4–15.2)

## 2018-11-18 LAB — TYPE AND SCREEN
ABO/RH(D): O POS
Antibody Screen: NEGATIVE

## 2018-11-18 LAB — SURGICAL PCR SCREEN
MRSA, PCR: NEGATIVE
STAPHYLOCOCCUS AUREUS: NEGATIVE

## 2018-11-18 LAB — ABO/RH: ABO/RH(D): O POS

## 2018-11-18 NOTE — Progress Notes (Addendum)
PCP - Candice Camp Cardiologist - [facility], last visit 2.5 yrs. Ago-stated to follow up on prn  Pulm: [facility], last visit 11/19 ( pt. Los 30lbs. Wt.,still reports sob is the same, but when she had the back ablations the sob was less.) Pt. Hasn't been back to see him.  Chest x-ray - today EKG - today Stress Test -  8/17 ECHO - 8/17 Cardiac Cath - na  Sleep Study - na CPAP -   Fasting Blood Sugar - na Checks Blood Sugar _____ times a day  Blood Thinner Instructions: na Aspirin Instructions:  Anesthesia review: medical hx.  Patient denies shortness of breath, fever, cough and chest pain at PAT appointment   Patient verbalized understanding of instructions that were given to them at the PAT appointment. Patient was also instructed that they will need to review over the PAT instructions again at home before surgery.

## 2018-11-21 NOTE — Anesthesia Preprocedure Evaluation (Addendum)
Anesthesia Evaluation  Patient identified by MRN, date of birth, ID band Patient awake    Reviewed: Allergy & Precautions, NPO status , Patient's Chart, lab work & pertinent test results  History of Anesthesia Complications (+) PONV and history of anesthetic complications  Airway Mallampati: I  TM Distance: >3 FB Neck ROM: Full    Dental no notable dental hx. (+) Teeth Intact, Dental Advisory Given   Pulmonary shortness of breath and with exertion,    Pulmonary exam normal breath sounds clear to auscultation       Cardiovascular negative cardio ROS Normal cardiovascular exam Rhythm:Regular Rate:Normal     Neuro/Psych  Headaches, PSYCHIATRIC DISORDERS Anxiety Depression    GI/Hepatic negative GI ROS, Neg liver ROS,   Endo/Other  negative endocrine ROS  Renal/GU negative Renal ROS  negative genitourinary   Musculoskeletal  (+) Arthritis , Fibromyalgia -  Abdominal   Peds  Hematology negative hematology ROS (+)   Anesthesia Other Findings   Reproductive/Obstetrics                            Anesthesia Physical Anesthesia Plan  ASA: II  Anesthesia Plan: General   Post-op Pain Management:    Induction: Intravenous  PONV Risk Score and Plan: 4 or greater and Ondansetron, Dexamethasone, Midazolam, Scopolamine patch - Pre-op and TIVA  Airway Management Planned: Oral ETT  Additional Equipment:   Intra-op Plan:   Post-operative Plan: Extubation in OR  Informed Consent: I have reviewed the patients History and Physical, chart, labs and discussed the procedure including the risks, benefits and alternatives for the proposed anesthesia with the patient or authorized representative who has indicated his/her understanding and acceptance.     Dental advisory given  Plan Discussed with: CRNA  Anesthesia Plan Comments: (Pt reports a history of low HR during previous anesthesia for  hysterectomy 30+ years ago. She says she did not have any complications, but was told her "HR was low during surgery". She has had subsequent surgeries without issue. She was evaluated by cardiologist Dr. Elby Beck at [facility] in 2017 for palpitations and chronic DOE. Echo, nuclear stress, and event monitor were ordered. Dr. Duke Salvia commented on results stating "Echo showed normal heart and valve function. The stress test did not show any signs of abnormal blood supplyeart. The event monitor showed a predominantly normal heart rhythm. There were brief episodes of a fast rhythm, but these were very brief.Marland KitchenMarland KitchenShe does not need to schedule followup. We'll be happy to see her as needed."  Pt continues to have chronic DOE at baseline, unchanged, uses albuterol PRN. Seen by PCP 01/13/19 for annual medicare exam, no changes, discussed upcoming back surgery.   -TTE 04/20/2016 in care everywhere shows EF 55-60%, no significant stenosis or regurgitation seen.  -Nuclear stress 04/20/2016 in care everywhere shows that the pt did report chest pain but there were no inducible LV regional wall motion abnormalities indicative of ischemia, negative stress ECG for inducible ischemia at target heart rate.    )    Anesthesia Quick Evaluation

## 2019-01-03 ENCOUNTER — Ambulatory Visit: Payer: Medicare PPO | Admitting: Podiatry

## 2019-01-03 ENCOUNTER — Encounter: Payer: Self-pay | Admitting: Podiatry

## 2019-01-03 ENCOUNTER — Other Ambulatory Visit: Payer: Self-pay

## 2019-01-03 VITALS — Temp 96.6°F

## 2019-01-03 DIAGNOSIS — Q828 Other specified congenital malformations of skin: Secondary | ICD-10-CM | POA: Diagnosis not present

## 2019-01-03 DIAGNOSIS — L600 Ingrowing nail: Secondary | ICD-10-CM | POA: Diagnosis not present

## 2019-01-04 NOTE — Progress Notes (Signed)
Subjective: 67 year old female presents the office they for reoccurring calluses of the left foot that has again become painful again.  She also states that she may be getting ingrown toenail to left big toe continue the medial aspect of the tip.  She states is sore with pressure in shoes.  Denies any drainage or pus.  She has changed shoes which helps some. She denies any recent injury or trauma.  She has no other concerns.    Objective: AAO x3, NAD DP/PT pulses palpable bilaterally, CRT less than 3 seconds There is prominence of metatarsal heads plantarly with atrophy of the fat pad. Hyperkeratotic tissue is present left foot submetatarsal 3. Upon debridement there is no underlying ulceration, drainage or any signs of infection. There is no other open lesions or pre-ulcerative lesions identified today. Tenderness over the hyperkeratotic lesion but no other areas of tenderness. Mild incurvation present to the medial aspect left hallux toenail there is tenderness palpation the distal medial aspect.  There is no edema, erythema, drainage or pus there is no clinical signs of infection. No pain with calf compression, swelling, warmth, erythema No acute changes otherwsie  Assessment: Left foot porokeratosis; hallux ingrown toenail  Plan: -All treatment options discussed with the patient including all alternatives, risks, complications.  -Lesion sharply debrided. X 1 without complications or bleeding. Offloading pads.  Continue shoe modifications -I debrided the left hallux toenails remove the symptomatic portion of the ingrowing without any complications or bleeding.  After debridement there is resolution of pain.  Monitor for any reoccurrence. -Daily foot inspection  Celesta Gentile, DPM

## 2019-01-31 ENCOUNTER — Other Ambulatory Visit (HOSPITAL_COMMUNITY): Payer: Medicare PPO

## 2019-02-02 ENCOUNTER — Other Ambulatory Visit (HOSPITAL_COMMUNITY): Payer: Medicare PPO

## 2019-03-15 NOTE — Pre-Procedure Instructions (Signed)
Lisa Morrison  03/15/2019      [id number] - [place], [place] [address] [place] [address] Phone: [phone] Fax: [phone]    Your procedure is scheduled on Mon., March 20, 2019 from 7:30AM-10:28AM  Report to [facility] Entrance "A" at 5:30AM  Call this number if you have problems the morning of surgery:  [phone]   Remember:  Do not eat or drink after midnight on July 12th    Take these medicines the morning of surgery with A SIP OF WATER: AMITIZA, BuPROPion (WELLBUTRIN SR), Cetirizine (ZYRTEC), and ClonazePAM (KLONOPIN)   If needed: Acetaminophen (TYLENOL) and Albuterol Inhaler-bring with you the day of surgery  As of today stop taking all Aspirin (unless instructed by your doctor) and Other Aspirin containing products, Vitamins, Fish oils, and Herbal medications. Also stop all NSAIDS i.e. Advil, Ibuprofen, Motrin, Aleve, Anaprox, Naproxen, BC, Goody Powders, and all Supplements.   Special instructions:  [place]- Preparing For Surgery  Before surgery, you can play an important role. Because skin is not sterile, your skin needs to be as free of germs as possible. You can reduce the number of germs on your skin by washing with CHG (chlorahexidine gluconate) Soap before surgery.  CHG is an antiseptic cleaner which kills germs and bonds with the skin to continue killing germs even after washing.    Please do not use if you have an allergy to CHG or antibacterial soaps. If your skin becomes reddened/irritated stop using the CHG.  Do not shave (including legs and underarms) for at least 48 hours prior to first CHG shower. It is OK to shave your face.  Please follow these instructions carefully.   1. Shower the NIGHT BEFORE SURGERY and the MORNING OF SURGERY with CHG.   2. If you chose to wash your hair, wash your hair first as usual with your normal shampoo.  3. After you shampoo, rinse your hair and body thoroughly  to remove the shampoo.  4. Use CHG as you would any other liquid soap. You can apply CHG directly to the skin and wash gently with a scrungie or a clean washcloth.   5. Apply the CHG Soap to your body ONLY FROM THE NECK DOWN.  Do not use on open wounds or open sores. Avoid contact with your eyes, ears, mouth and genitals (private parts). Wash Face and genitals (private parts)  with your normal soap.  6. Wash thoroughly, paying special attention to the area where your surgery will be performed.  7. Thoroughly rinse your body with warm water from the neck down.  8. DO NOT shower/wash with your normal soap after using and rinsing off the CHG Soap.  9. Pat yourself dry with a CLEAN TOWEL.  10. Wear CLEAN PAJAMAS to bed the night before surgery, wear comfortable clothes the morning of surgery  11. Place CLEAN SHEETS on your bed the night of your first shower and DO NOT SLEEP WITH PETS.  Day of Surgery:   Oral Hygiene is also important to reduce your risk of infection.  Remember - BRUSH YOUR TEETH THE MORNING OF SURGERY WITH YOUR REGULAR TOOTHPASTE   Do not wear jewelry, make-up or nail polish.  Do not wear lotions, powders, or perfumes, or deodorant.  Do not shave 48 hours prior to surgery.    Do not bring valuables to the hospital.  [facility] is not responsible for any belongings or valuables.  Please wear clean clothes to the hospital/surgery center.              Contacts, dentures or bridgework may not be worn into surgery.    For patients admitted to the hospital, discharge time will be determined by your treatment team.  Patients discharged the day of surgery will not be allowed to drive home.   Please read over the following fact sheets that you were given. Pain Booklet, Coughing and Deep Breathing, MRSA Information and Surgical Site Infection Prevention

## 2019-03-16 ENCOUNTER — Encounter (HOSPITAL_COMMUNITY)
Admission: RE | Admit: 2019-03-16 | Discharge: 2019-03-16 | Disposition: A | Discharge status: Home / Self Care | Payer: Medicare PPO | Source: Ambulatory Visit | Attending: Neurological Surgery | Admitting: Neurological Surgery

## 2019-03-16 ENCOUNTER — Other Ambulatory Visit: Payer: Self-pay

## 2019-03-16 ENCOUNTER — Other Ambulatory Visit (HOSPITAL_COMMUNITY)
Admission: RE | Admit: 2019-03-16 | Discharge: 2019-03-16 | Disposition: A | Discharge status: Home / Self Care | Payer: Medicare PPO | Source: Ambulatory Visit | Attending: Neurological Surgery | Admitting: Neurological Surgery

## 2019-03-16 ENCOUNTER — Encounter (HOSPITAL_COMMUNITY): Payer: Self-pay

## 2019-03-16 DIAGNOSIS — Z1159 Encounter for screening for other viral diseases: Secondary | ICD-10-CM | POA: Insufficient documentation

## 2019-03-16 DIAGNOSIS — Z01812 Encounter for preprocedural laboratory examination: Secondary | ICD-10-CM | POA: Diagnosis present

## 2019-03-16 HISTORY — DX: Spondylolisthesis, lumbar region: M43.16

## 2019-03-16 HISTORY — DX: Presence of spectacles and contact lenses: Z97.3

## 2019-03-16 HISTORY — DX: Unspecified osteoarthritis, unspecified site: M19.90

## 2019-03-16 LAB — BASIC METABOLIC PANEL
Anion gap: 9 (ref 5–15)
BUN: 19 mg/dL (ref 8–23)
CO2: 26 mmol/L (ref 22–32)
Calcium: 9.1 mg/dL (ref 8.9–10.3)
Chloride: 105 mmol/L (ref 98–111)
Creatinine, Ser: 0.99 mg/dL (ref 0.44–1.00)
GFR calc Af Amer: >60 mL/min (ref >60)
GFR calc non Af Amer: 59 mL/min — ABNORMAL LOW (ref >60)
Glucose, Bld: 88 mg/dL (ref 70–99)
Potassium: 3.3 mmol/L — ABNORMAL LOW (ref 3.5–5.1)
Sodium: 140 mmol/L (ref 135–145)

## 2019-03-16 LAB — CBC
HCT: 44.1 % (ref 36.0–46.0)
Hemoglobin: 14.5 g/dL (ref 12.0–15.0)
MCH: 31 pg (ref 26.0–34.0)
MCHC: 32.9 g/dL (ref 30.0–36.0)
MCV: 94.4 fL (ref 80.0–100.0)
Platelets: 251 10*3/uL (ref 150–400)
RBC: 4.67 MIL/uL (ref 3.87–5.11)
RDW: 12.7 % (ref 11.5–15.5)
WBC: 6.1 10*3/uL (ref 4.0–10.5)
nRBC: 0 % (ref 0.0–0.2)

## 2019-03-16 LAB — TYPE AND SCREEN
ABO/RH(D): O POS
Antibody Screen: NEGATIVE

## 2019-03-16 LAB — SURGICAL PCR SCREEN
MRSA, PCR: NEGATIVE
Staphylococcus aureus: NEGATIVE

## 2019-03-16 NOTE — Progress Notes (Signed)
Pt denies any acute pulmonary issues. Pt stated that she has seen a pulmonologist " 2 years ago for SOB and they told me to lose weight, I lost weight and I still have SOB. " Pt stated that she has SOB when walking and last used her inhaler this morning. Pt denies being under the care of a cardiologist. Pt denies having a cardiac cath. Pt denies recent labs. Pt stated that her PCP is Dr. Larene Beach at [facility].  Pt reminded to quarantine after scheduled COVID-19 test today.   Pt denies that she and family members experienced the following symptoms:  Cough yes/no: No Fever (>100.46F)  yes/no: No Runny nose yes/no: No Sore throat yes/no: No Difficulty breathing/shortness of breath  yes/no: No  ( Pt has chronic hx of SOB)  Have you or a family member traveled in the last 14 days and where? yes/no: No  Pt reminded that hospital visitation restrictions are in effect and the importance of the restrictions.   Pt verbalized understanding of all pre-op instructions.  Pt chart forwarded to Karoline Caldwell, [employer], Anesthesiology, for review.

## 2019-03-17 LAB — SARS CORONAVIRUS 2 (TAT 6-24 HRS): SARS Coronavirus 2: NEGATIVE

## 2019-03-20 ENCOUNTER — Inpatient Hospital Stay (HOSPITAL_COMMUNITY): Payer: Medicare PPO | Admitting: Anesthesiology

## 2019-03-20 ENCOUNTER — Inpatient Hospital Stay (HOSPITAL_COMMUNITY): Payer: Medicare PPO | Admitting: Physician Assistant

## 2019-03-20 ENCOUNTER — Inpatient Hospital Stay (HOSPITAL_COMMUNITY): Payer: Medicare PPO

## 2019-03-20 ENCOUNTER — Encounter (HOSPITAL_COMMUNITY)
Admission: RE | Disposition: A | Discharge status: Home / Self Care | Payer: Self-pay | Source: Home / Self Care | Attending: Neurological Surgery

## 2019-03-20 ENCOUNTER — Inpatient Hospital Stay (HOSPITAL_COMMUNITY)
Admission: RE | Admit: 2019-03-20 | Discharge: 2019-03-21 | DRG: 455 | Disposition: A | Discharge status: Home / Self Care | Payer: Medicare PPO | Attending: Neurological Surgery | Admitting: Neurological Surgery

## 2019-03-20 ENCOUNTER — Other Ambulatory Visit: Payer: Self-pay

## 2019-03-20 ENCOUNTER — Encounter (HOSPITAL_COMMUNITY): Payer: Self-pay | Admitting: *Deleted

## 2019-03-20 DIAGNOSIS — Z8701 Personal history of pneumonia (recurrent): Secondary | ICD-10-CM | POA: Diagnosis not present

## 2019-03-20 DIAGNOSIS — Z880 Allergy status to penicillin: Secondary | ICD-10-CM

## 2019-03-20 DIAGNOSIS — F419 Anxiety disorder, unspecified: Secondary | ICD-10-CM | POA: Diagnosis present

## 2019-03-20 DIAGNOSIS — Z79899 Other long term (current) drug therapy: Secondary | ICD-10-CM

## 2019-03-20 DIAGNOSIS — Z419 Encounter for procedure for purposes other than remedying health state, unspecified: Secondary | ICD-10-CM

## 2019-03-20 DIAGNOSIS — Z9049 Acquired absence of other specified parts of digestive tract: Secondary | ICD-10-CM | POA: Diagnosis not present

## 2019-03-20 DIAGNOSIS — Z90710 Acquired absence of both cervix and uterus: Secondary | ICD-10-CM | POA: Diagnosis not present

## 2019-03-20 DIAGNOSIS — M48061 Spinal stenosis, lumbar region without neurogenic claudication: Secondary | ICD-10-CM | POA: Diagnosis present

## 2019-03-20 DIAGNOSIS — M797 Fibromyalgia: Secondary | ICD-10-CM | POA: Diagnosis present

## 2019-03-20 DIAGNOSIS — Z87892 Personal history of anaphylaxis: Secondary | ICD-10-CM

## 2019-03-20 DIAGNOSIS — M532X6 Spinal instabilities, lumbar region: Secondary | ICD-10-CM | POA: Diagnosis present

## 2019-03-20 DIAGNOSIS — Z981 Arthrodesis status: Secondary | ICD-10-CM

## 2019-03-20 DIAGNOSIS — J302 Other seasonal allergic rhinitis: Secondary | ICD-10-CM | POA: Diagnosis present

## 2019-03-20 DIAGNOSIS — M1990 Unspecified osteoarthritis, unspecified site: Secondary | ICD-10-CM | POA: Diagnosis present

## 2019-03-20 DIAGNOSIS — M4316 Spondylolisthesis, lumbar region: Secondary | ICD-10-CM | POA: Diagnosis present

## 2019-03-20 DIAGNOSIS — Z885 Allergy status to narcotic agent status: Secondary | ICD-10-CM | POA: Diagnosis not present

## 2019-03-20 DIAGNOSIS — F329 Major depressive disorder, single episode, unspecified: Secondary | ICD-10-CM | POA: Diagnosis present

## 2019-03-20 SURGERY — POSTERIOR LUMBAR FUSION 1 LEVEL
Anesthesia: General | Site: Back

## 2019-03-20 MED ORDER — PHENOL 1.4 % MT LIQD: 1.0000 | OROMUCOSAL | Status: DC | PRN

## 2019-03-20 MED ORDER — POTASSIUM CHLORIDE CRYS ER 20 MEQ PO TBCR
20.0000 meq | EXTENDED_RELEASE_TABLET | Freq: Every day | ORAL | Status: DC
  Administered 2019-03-21: 20 meq via ORAL
  Filled 2019-03-20: qty 1

## 2019-03-20 MED ORDER — ONDANSETRON HCL 4 MG/2ML IJ SOLN: 4.0000 mg | Freq: Four times a day (QID) | INTRAMUSCULAR | Status: DC | PRN

## 2019-03-20 MED ORDER — SODIUM CHLORIDE 0.9% FLUSH: 3.0000 mL | Freq: Two times a day (BID) | INTRAVENOUS | Status: DC

## 2019-03-20 MED ORDER — SODIUM CHLORIDE 0.9% FLUSH: 3.0000 mL | INTRAVENOUS | Status: DC | PRN

## 2019-03-20 MED ORDER — BUPIVACAINE HCL (PF) 0.25 % IJ SOLN
INTRAMUSCULAR | Status: DC | PRN
  Administered 2019-03-20: 6 mL

## 2019-03-20 MED ORDER — THROMBIN 20000 UNITS EX SOLR
CUTANEOUS | Status: DC | PRN
  Administered 2019-03-20: 20 mL via TOPICAL

## 2019-03-20 MED ORDER — TRIAMTERENE-HCTZ 37.5-25 MG PO CAPS
1.0000 | ORAL_CAPSULE | Freq: Every day | ORAL | Status: DC
  Filled 2019-03-20: qty 1

## 2019-03-20 MED ORDER — PREGABALIN 75 MG PO CAPS
150.0000 mg | ORAL_CAPSULE | Freq: Every day | ORAL | Status: DC
  Administered 2019-03-20: 150 mg via ORAL
  Filled 2019-03-20: qty 2

## 2019-03-20 MED ORDER — SENNA 8.6 MG PO TABS
1.0000 | ORAL_TABLET | Freq: Two times a day (BID) | ORAL | Status: DC
  Administered 2019-03-20 – 2019-03-21 (×2): 8.6 mg via ORAL
  Filled 2019-03-20 (×2): qty 1

## 2019-03-20 MED ORDER — DEXAMETHASONE 4 MG PO TABS
4.0000 mg | ORAL_TABLET | Freq: Four times a day (QID) | ORAL | Status: DC
  Administered 2019-03-20 – 2019-03-21 (×4): 4 mg via ORAL
  Filled 2019-03-20 (×4): qty 1

## 2019-03-20 MED ORDER — METHOCARBAMOL 500 MG PO TABS
500.0000 mg | ORAL_TABLET | Freq: Four times a day (QID) | ORAL | Status: DC | PRN
  Administered 2019-03-20 – 2019-03-21 (×2): 500 mg via ORAL
  Filled 2019-03-20: qty 1

## 2019-03-20 MED ORDER — MENTHOL 3 MG MT LOZG: 1.0000 | LOZENGE | OROMUCOSAL | Status: DC | PRN

## 2019-03-20 MED ORDER — CHLORHEXIDINE GLUCONATE CLOTH 2 % EX PADS: 6.0000 | MEDICATED_PAD | Freq: Once | CUTANEOUS | Status: DC

## 2019-03-20 MED ORDER — PROPOFOL 10 MG/ML IV BOLUS
INTRAVENOUS | Status: DC | PRN
  Administered 2019-03-20: 120 mg via INTRAVENOUS

## 2019-03-20 MED ORDER — TOPIRAMATE 25 MG PO TABS
50.0000 mg | ORAL_TABLET | Freq: Every day | ORAL | Status: DC
  Filled 2019-03-20 (×2): qty 2

## 2019-03-20 MED ORDER — HEPARIN SODIUM (PORCINE) 1000 UNIT/ML IJ SOLN
INTRAMUSCULAR | Status: DC | PRN
  Administered 2019-03-20: 5000 [IU]

## 2019-03-20 MED ORDER — LUBIPROSTONE 24 MCG PO CAPS
24.0000 ug | ORAL_CAPSULE | Freq: Every day | ORAL | Status: DC
  Administered 2019-03-21: 24 ug via ORAL
  Filled 2019-03-20: qty 1

## 2019-03-20 MED ORDER — ONDANSETRON HCL 4 MG/2ML IJ SOLN
INTRAMUSCULAR | Status: AC
  Filled 2019-03-20: qty 2

## 2019-03-20 MED ORDER — BUPIVACAINE HCL (PF) 0.25 % IJ SOLN
INTRAMUSCULAR | Status: AC
  Filled 2019-03-20: qty 30

## 2019-03-20 MED ORDER — DEXAMETHASONE SODIUM PHOSPHATE 10 MG/ML IJ SOLN
10.0000 mg | INTRAMUSCULAR | Status: AC
  Administered 2019-03-20: 10 mg via INTRAVENOUS
  Filled 2019-03-20: qty 1

## 2019-03-20 MED ORDER — CELECOXIB 200 MG PO CAPS
200.0000 mg | ORAL_CAPSULE | Freq: Two times a day (BID) | ORAL | Status: DC
  Administered 2019-03-20 – 2019-03-21 (×3): 200 mg via ORAL
  Filled 2019-03-20 (×3): qty 1

## 2019-03-20 MED ORDER — CLONAZEPAM 0.5 MG PO TABS: 0.2500 mg | ORAL_TABLET | Freq: Two times a day (BID) | ORAL | Status: DC

## 2019-03-20 MED ORDER — VANCOMYCIN HCL IN DEXTROSE 1-5 GM/200ML-% IV SOLN
1000.0000 mg | Freq: Once | INTRAVENOUS | Status: AC
  Administered 2019-03-20: 1000 mg via INTRAVENOUS
  Filled 2019-03-20: qty 200

## 2019-03-20 MED ORDER — PROPOFOL 10 MG/ML IV BOLUS
INTRAVENOUS | Status: AC
  Filled 2019-03-20: qty 20

## 2019-03-20 MED ORDER — HEPARIN SODIUM (PORCINE) 1000 UNIT/ML IJ SOLN
INTRAMUSCULAR | Status: AC
  Filled 2019-03-20: qty 1

## 2019-03-20 MED ORDER — LIDOCAINE HCL (CARDIAC) PF 100 MG/5ML IV SOSY
PREFILLED_SYRINGE | INTRAVENOUS | Status: DC | PRN
  Administered 2019-03-20: 80 mg via INTRAVENOUS

## 2019-03-20 MED ORDER — FENTANYL CITRATE (PF) 100 MCG/2ML IJ SOLN
25.0000 ug | INTRAMUSCULAR | Status: DC | PRN
  Administered 2019-03-20: 50 ug via INTRAVENOUS

## 2019-03-20 MED ORDER — SODIUM CHLORIDE 0.9 % IV SOLN: 250.0000 mL | INTRAVENOUS | Status: DC

## 2019-03-20 MED ORDER — MIDAZOLAM HCL 2 MG/2ML IJ SOLN
INTRAMUSCULAR | Status: AC
  Filled 2019-03-20: qty 2

## 2019-03-20 MED ORDER — MORPHINE SULFATE (PF) 2 MG/ML IV SOLN: 2.0000 mg | INTRAVENOUS | Status: DC | PRN

## 2019-03-20 MED ORDER — VANCOMYCIN HCL IN DEXTROSE 1-5 GM/200ML-% IV SOLN
1000.0000 mg | INTRAVENOUS | Status: AC
  Administered 2019-03-20: 07:00:00 1000 mg via INTRAVENOUS
  Filled 2019-03-20: qty 200

## 2019-03-20 MED ORDER — THROMBIN 20000 UNITS EX SOLR
CUTANEOUS | Status: AC
  Filled 2019-03-20: qty 20000

## 2019-03-20 MED ORDER — THROMBIN 5000 UNITS EX SOLR
OROMUCOSAL | Status: DC | PRN
  Administered 2019-03-20: 5 mL via TOPICAL

## 2019-03-20 MED ORDER — FENTANYL CITRATE (PF) 250 MCG/5ML IJ SOLN
INTRAMUSCULAR | Status: AC
  Filled 2019-03-20: qty 5

## 2019-03-20 MED ORDER — METHOCARBAMOL 500 MG PO TABS
ORAL_TABLET | ORAL | Status: AC
  Filled 2019-03-20: qty 1

## 2019-03-20 MED ORDER — EPHEDRINE 5 MG/ML INJ
INTRAVENOUS | Status: AC
  Filled 2019-03-20: qty 10

## 2019-03-20 MED ORDER — DEXAMETHASONE SODIUM PHOSPHATE 4 MG/ML IJ SOLN
4.0000 mg | Freq: Four times a day (QID) | INTRAMUSCULAR | Status: DC
  Administered 2019-03-21: 4 mg via INTRAVENOUS
  Filled 2019-03-20: qty 1

## 2019-03-20 MED ORDER — SUCCINYLCHOLINE CHLORIDE 20 MG/ML IJ SOLN
INTRAMUSCULAR | Status: DC | PRN
  Administered 2019-03-20: 160 mg via INTRAVENOUS

## 2019-03-20 MED ORDER — FENTANYL CITRATE (PF) 100 MCG/2ML IJ SOLN
INTRAMUSCULAR | Status: DC | PRN
  Administered 2019-03-20 (×2): 100 ug via INTRAVENOUS
  Administered 2019-03-20: 50 ug via INTRAVENOUS

## 2019-03-20 MED ORDER — METHOCARBAMOL 1000 MG/10ML IJ SOLN
500.0000 mg | Freq: Four times a day (QID) | INTRAVENOUS | Status: DC | PRN
  Filled 2019-03-20: qty 5

## 2019-03-20 MED ORDER — THROMBIN 5000 UNITS EX SOLR
CUTANEOUS | Status: AC
  Filled 2019-03-20: qty 5000

## 2019-03-20 MED ORDER — ALBUMIN HUMAN 5 % IV SOLN
INTRAVENOUS | Status: DC | PRN
  Administered 2019-03-20: 10:00:00 via INTRAVENOUS

## 2019-03-20 MED ORDER — LACTATED RINGERS IV SOLN
INTRAVENOUS | Status: DC | PRN
  Administered 2019-03-20 (×2): via INTRAVENOUS

## 2019-03-20 MED ORDER — PROPOFOL 500 MG/50ML IV EMUL
INTRAVENOUS | Status: DC | PRN
  Administered 2019-03-20: 120 ug/kg/min via INTRAVENOUS
  Administered 2019-03-20: 60 ug/kg/min via INTRAVENOUS

## 2019-03-20 MED ORDER — PROPOFOL 1000 MG/100ML IV EMUL
INTRAVENOUS | Status: AC
  Filled 2019-03-20: qty 400

## 2019-03-20 MED ORDER — ONDANSETRON HCL 4 MG/2ML IJ SOLN
INTRAMUSCULAR | Status: DC | PRN
  Administered 2019-03-20: 4 mg via INTRAVENOUS

## 2019-03-20 MED ORDER — SODIUM CHLORIDE 0.9 % IV SOLN
INTRAVENOUS | Status: DC | PRN
  Administered 2019-03-20: 08:00:00 40 ug/min via INTRAVENOUS

## 2019-03-20 MED ORDER — ONDANSETRON HCL 4 MG PO TABS: 4.0000 mg | ORAL_TABLET | Freq: Four times a day (QID) | ORAL | Status: DC | PRN

## 2019-03-20 MED ORDER — BUPROPION HCL ER (SR) 150 MG PO TB12
150.0000 mg | ORAL_TABLET | Freq: Two times a day (BID) | ORAL | Status: DC
  Administered 2019-03-21: 150 mg via ORAL
  Filled 2019-03-20 (×2): qty 1

## 2019-03-20 MED ORDER — CLONAZEPAM 0.5 MG PO TABS
0.2500 mg | ORAL_TABLET | Freq: Two times a day (BID) | ORAL | Status: DC
  Administered 2019-03-21: 0.25 mg via ORAL
  Filled 2019-03-20 (×2): qty 1

## 2019-03-20 MED ORDER — SUCCINYLCHOLINE CHLORIDE 200 MG/10ML IV SOSY
PREFILLED_SYRINGE | INTRAVENOUS | Status: AC
  Filled 2019-03-20: qty 10

## 2019-03-20 MED ORDER — NON FORMULARY
Status: DC | PRN
  Administered 2019-03-20: 12 mL

## 2019-03-20 MED ORDER — SODIUM CHLORIDE 0.9 % IV SOLN
INTRAVENOUS | Status: DC | PRN
  Administered 2019-03-20: 500 mL

## 2019-03-20 MED ORDER — ALBUTEROL SULFATE (2.5 MG/3ML) 0.083% IN NEBU: 3.0000 mL | INHALATION_SOLUTION | RESPIRATORY_TRACT | Status: DC | PRN

## 2019-03-20 MED ORDER — PHENYLEPHRINE 40 MCG/ML (10ML) SYRINGE FOR IV PUSH (FOR BLOOD PRESSURE SUPPORT)
PREFILLED_SYRINGE | INTRAVENOUS | Status: AC
  Filled 2019-03-20: qty 10

## 2019-03-20 MED ORDER — 0.9 % SODIUM CHLORIDE (POUR BTL) OPTIME
TOPICAL | Status: DC | PRN
  Administered 2019-03-20: 1000 mL

## 2019-03-20 MED ORDER — FENTANYL CITRATE (PF) 100 MCG/2ML IJ SOLN
INTRAMUSCULAR | Status: AC
  Filled 2019-03-20: qty 2

## 2019-03-20 MED ORDER — LIDOCAINE 2% (20 MG/ML) 5 ML SYRINGE
INTRAMUSCULAR | Status: AC
  Filled 2019-03-20: qty 5

## 2019-03-20 MED ORDER — SCOPOLAMINE 1 MG/3DAYS TD PT72
MEDICATED_PATCH | TRANSDERMAL | Status: DC | PRN
  Administered 2019-03-20: 1 via TRANSDERMAL

## 2019-03-20 MED ORDER — PHENYLEPHRINE HCL (PRESSORS) 10 MG/ML IV SOLN
INTRAVENOUS | Status: DC | PRN
  Administered 2019-03-20: 80 ug via INTRAVENOUS

## 2019-03-20 MED ORDER — OXYCODONE HCL 5 MG PO TABS
ORAL_TABLET | ORAL | Status: AC
  Filled 2019-03-20: qty 1

## 2019-03-20 MED ORDER — EPHEDRINE SULFATE 50 MG/ML IJ SOLN
INTRAMUSCULAR | Status: DC | PRN
  Administered 2019-03-20: 5 mg via INTRAVENOUS

## 2019-03-20 MED ORDER — ACETAMINOPHEN 10 MG/ML IV SOLN
INTRAVENOUS | Status: AC
  Filled 2019-03-20: qty 100

## 2019-03-20 MED ORDER — ACETAMINOPHEN 325 MG PO TABS
650.0000 mg | ORAL_TABLET | ORAL | Status: DC | PRN
  Administered 2019-03-20 – 2019-03-21 (×3): 650 mg via ORAL
  Filled 2019-03-20 (×3): qty 2

## 2019-03-20 MED ORDER — ACETAMINOPHEN 650 MG RE SUPP: 650.0000 mg | RECTAL | Status: DC | PRN

## 2019-03-20 MED ORDER — ACETAMINOPHEN 500 MG PO TABS
1000.0000 mg | ORAL_TABLET | Freq: Once | ORAL | Status: AC
  Administered 2019-03-20: 10:00:00 1000 mg via ORAL
  Filled 2019-03-20: qty 2

## 2019-03-20 MED ORDER — POTASSIUM CHLORIDE IN NACL 20-0.9 MEQ/L-% IV SOLN: INTRAVENOUS | Status: DC

## 2019-03-20 MED ORDER — OXYCODONE HCL 5 MG PO TABS
5.0000 mg | ORAL_TABLET | ORAL | Status: DC | PRN
  Administered 2019-03-20 – 2019-03-21 (×3): 5 mg via ORAL
  Filled 2019-03-20 (×2): qty 1

## 2019-03-20 SURGICAL SUPPLY — 69 items
BAG DECANTER FOR FLEXI CONT (MISCELLANEOUS) ×3 IMPLANT
BASKET BONE COLLECTION (BASKET) ×3 IMPLANT
BENZOIN TINCTURE PRP APPL 2/3 (GAUZE/BANDAGES/DRESSINGS) ×3 IMPLANT
BLADE CLIPPER SURG (BLADE) IMPLANT
BUR MATCHSTICK NEURO 3.0 LAGG (BURR) ×3 IMPLANT
CANISTER SUCT 3000ML PPV (MISCELLANEOUS) ×3 IMPLANT
CARTRIDGE OIL MAESTRO DRILL (MISCELLANEOUS) ×1 IMPLANT
CLIP SPRING STIM LLIF SAFEOP (CLIP) ×3 IMPLANT
CLOSURE WOUND 1/2 X4 (GAUZE/BANDAGES/DRESSINGS) ×1
CONT SPEC 4OZ CLIKSEAL STRL BL (MISCELLANEOUS) ×3 IMPLANT
COVER BACK TABLE 60X90IN (DRAPES) ×3 IMPLANT
COVER WAND RF STERILE (DRAPES) ×3 IMPLANT
DERMABOND ADVANCED (GAUZE/BANDAGES/DRESSINGS) ×2
DERMABOND ADVANCED .7 DNX12 (GAUZE/BANDAGES/DRESSINGS) ×1 IMPLANT
DIFFUSER DRILL AIR PNEUMATIC (MISCELLANEOUS) ×3 IMPLANT
DRAPE C-ARM 42X72 X-RAY (DRAPES) ×12 IMPLANT
DRAPE LAPAROTOMY 100X72X124 (DRAPES) ×3 IMPLANT
DRAPE POUCH INSTRU U-SHP 10X18 (DRAPES) IMPLANT
DRAPE SURG 17X23 STRL (DRAPES) ×3 IMPLANT
DRSG OPSITE POSTOP 4X6 (GAUZE/BANDAGES/DRESSINGS) ×3 IMPLANT
DURAPREP 26ML APPLICATOR (WOUND CARE) ×3 IMPLANT
ELECT KIT SAFEOP EMG/NMJ (ELECTRODE) ×2
ELECT REM PT RETURN 9FT ADLT (ELECTROSURGICAL) ×3
ELECTRODE REM PT RTRN 9FT ADLT (ELECTROSURGICAL) ×1 IMPLANT
EVACUATOR 1/8 PVC DRAIN (DRAIN) ×3 IMPLANT
GAUZE 4X4 16PLY RFD (DISPOSABLE) IMPLANT
GLOVE BIO SURGEON STRL SZ7 (GLOVE) IMPLANT
GLOVE BIO SURGEON STRL SZ8 (GLOVE) ×6 IMPLANT
GLOVE BIOGEL PI IND STRL 7.0 (GLOVE) IMPLANT
GLOVE BIOGEL PI IND STRL 8 (GLOVE) ×4 IMPLANT
GLOVE BIOGEL PI INDICATOR 7.0 (GLOVE)
GLOVE BIOGEL PI INDICATOR 8 (GLOVE) ×8
GLOVE ECLIPSE 7.5 STRL STRAW (GLOVE) ×9 IMPLANT
GOWN STRL REUS W/ TWL LRG LVL3 (GOWN DISPOSABLE) ×1 IMPLANT
GOWN STRL REUS W/ TWL XL LVL3 (GOWN DISPOSABLE) ×1 IMPLANT
GOWN STRL REUS W/TWL 2XL LVL3 (GOWN DISPOSABLE) ×3 IMPLANT
GOWN STRL REUS W/TWL LRG LVL3 (GOWN DISPOSABLE) ×2
GOWN STRL REUS W/TWL XL LVL3 (GOWN DISPOSABLE) ×2
HEMOSTAT POWDER KIT SURGIFOAM (HEMOSTASIS) ×3 IMPLANT
KIT BASIN OR (CUSTOM PROCEDURE TRAY) ×3 IMPLANT
KIT BONE MRW ASP ANGEL CPRP (KITS) ×3 IMPLANT
KIT EMG SRFC ELECT SAFEOP (ELECTRODE) ×1 IMPLANT
KIT TURNOVER KIT B (KITS) ×3 IMPLANT
MILL MEDIUM DISP (BLADE) ×3 IMPLANT
NEEDLE HYPO 25X1 1.5 SAFETY (NEEDLE) ×3 IMPLANT
NS IRRIG 1000ML POUR BTL (IV SOLUTION) ×3 IMPLANT
OIL CARTRIDGE MAESTRO DRILL (MISCELLANEOUS) ×3
PACK LAMINECTOMY NEURO (CUSTOM PROCEDURE TRAY) ×3 IMPLANT
PAD ARMBOARD 7.5X6 YLW CONV (MISCELLANEOUS) ×9 IMPLANT
PUTTY DBM ALLOSYNC PURE 10CC (Putty) ×3 IMPLANT
ROD LORDOTIC TI 5.5X40 KODIAK (Rod) ×6 IMPLANT
SCREW MOD INVICTUS 5.5X40 (Screw) ×12 IMPLANT
SCREW POLYAXIAL TULIP (Screw) ×12 IMPLANT
SET SCREW (Screw) ×8 IMPLANT
SET SCREW SPNE (Screw) ×4 IMPLANT
SPACER IDENTITI PS 9X9X25 10D (Spacer) ×6 IMPLANT
SPONGE LAP 4X18 RFD (DISPOSABLE) IMPLANT
SPONGE SURGIFOAM ABS GEL 100 (HEMOSTASIS) ×3 IMPLANT
STRIP CLOSURE SKIN 1/2X4 (GAUZE/BANDAGES/DRESSINGS) ×2 IMPLANT
SUT VIC AB 0 CT1 18XCR BRD8 (SUTURE) ×2 IMPLANT
SUT VIC AB 0 CT1 8-18 (SUTURE) ×4
SUT VIC AB 2-0 CP2 18 (SUTURE) ×6 IMPLANT
SUT VIC AB 3-0 SH 8-18 (SUTURE) ×6 IMPLANT
SYR 30ML LL (SYRINGE) ×6 IMPLANT
SYR CONTROL 10ML LL (SYRINGE) ×3 IMPLANT
TOWEL GREEN STERILE (TOWEL DISPOSABLE) ×3 IMPLANT
TOWEL GREEN STERILE FF (TOWEL DISPOSABLE) ×3 IMPLANT
TRAY FOLEY MTR SLVR 16FR STAT (SET/KITS/TRAYS/PACK) ×3 IMPLANT
WATER STERILE IRR 1000ML POUR (IV SOLUTION) ×3 IMPLANT

## 2019-03-20 NOTE — Op Note (Signed)
03/20/2019  11:03 AM  PATIENT:  Lisa Morrison  68 y.o. 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 Decompressive lumbar laminectomy L3-4 requiring more work than would be required for a simple exposure of the disk for PLIF in order to adequately decompress the neural elements and address the spinal stenosis 2. Posterior lumbar interbody fusion L3-4 using porous titanium interbody cages packed with morcellized allograft and autograft soaked with a bone marrow aspirate obtained through a separate fascial incision over the right iliac crest 3. Posterior fixation L3-4 using Alphatec cortical pedicle screws.  4. Intertransverse arthrodesis L3-4 using morcellized autograft and allograft.  SURGEON:  Sherley Bounds, MD  ASSISTANTS: Glenford Peers, FNP  ANESTHESIA:  General  EBL: 200 ml  Total I/O In: 1250 [I.V.:1000; IV Piggyback:250] Out: 450 [Urine:250; Blood:200]  BLOOD ADMINISTERED:none  DRAINS: none   INDICATION FOR PROCEDURE: This patient presented with severe back and leg pain with a feeling weakness in her legs. Imaging revealed grade 2 spondylolisthesis L3-4 with spinal stenosis. The patient tried a reasonable attempt at conservative medical measures without relief. I recommended decompression and instrumented fusion to address the stenosis as well as the segmental  instability.  Patient understood the risks, benefits, and alternatives and potential outcomes and wished to proceed.  PROCEDURE DETAILS:  The patient was brought to the operating room. After induction of generalized endotracheal anesthesia the patient was rolled into the prone position on chest rolls and all pressure points were padded. The patient's lumbar region was cleaned and then prepped with DuraPrep and draped in the usual sterile fashion. Anesthesia was injected and then a dorsal  midline incision was made and carried down to the lumbosacral fascia. The fascia was opened and the paraspinous musculature was taken down in a subperiosteal fashion to expose L3-4. A self-retaining retractor was placed. Intraoperative fluoroscopy confirmed my level, and I started with placement of the L3 cortical pedicle screws. The pedicle screw entry zones were identified utilizing surface landmarks and  AP and lateral fluoroscopy. I scored the cortex with the high-speed drill and then used the hand drill to drill an upward and outward direction into the pedicle. I then tapped line to line. I then placed a 5.5 x 40 mm cortical pedicle screw into the pedicles of L3 bilaterally.  I then dissected in a suprafascial plane to expose the iliac crest.  Open the fascia used a Jamshidi needle to extract 60 cc of bone marrow aspirate from the iliac crest.  This was then spun down by [facility] device and 2 to 4 cc of  BMAC was soaked on morselized allograft for later arthrodesis.  I dried the hole with Surgifoam and closed the fascia.  I then turned my attention to the decompression and complete lumbar laminectomies, hemi- facetectomies, and foraminotomies were performed at L3-4. The patient had significant spinal stenosis and this required more work than would be required for a simple exposure of the disc for posterior lumbar interbody fusion which would only require a limited laminotomy. Much more generous decompression and generous foraminotomy was undertaken in order to adequately decompress the neural elements and address the patient's leg pain. The yellow ligament was removed to expose the underlying dura and nerve roots, and generous foraminotomies were performed to adequately decompress the neural elements. Both the exiting and traversing nerve roots were decompressed on both sides until a coronary dilator passed easily  along the nerve roots. Once the decompression was complete, I turned my attention to the posterior  lower lumbar interbody fusion. The epidural venous vasculature was coagulated and cut sharply. Disc space was incised and the initial discectomy was performed with pituitary rongeurs. The disc space was distracted with sequential distractors to a height of 9 mm. We then used a series of scrapers and shavers to prepare the endplates for fusion. The midline was prepared with Epstein curettes. Once the complete discectomy was finished, we packed an appropriate sized interbody cage with local autograft and morcellized allograft, gently retracted the nerve root, and tapped the cage into position at L3-4.  The midline between the cages was packed with morselized autograft and allograft. We then turned our attention to the placement of the lower pedicle screws. The pedicle screw entry zones were identified utilizing surface landmarks and fluoroscopy. I drilled into each pedicle utilizing the hand drill, and tapped each pedicle with the appropriate tap. We palpated with a ball probe to assure no break in the cortex. We then placed 5.5 x 40 mm cortical pedicle screws into the pedicles bilaterally at L4. We then decorticated the transverse processes and laid a mixture of morcellized autograft and allograft out over these to perform intertransverse arthrodesis at L3-4. We then placed lordotic rods into the multiaxial screw heads of the pedicle screws and locked these in position with the locking caps and anti-torque device. We then checked our construct with AP and lateral fluoroscopy. Irrigated with copious amounts of bacitracin-containing saline solution. Inspected the nerve roots once again to assure adequate decompression, lined to the dura with Gelfoam, placed powdered vancomycin into the wound, and closed the muscle and the fascia with 0 Vicryl. Closed the subcutaneous tissues with 2-0 Vicryl and subcuticular tissues with 3-0 Vicryl. The skin was closed with benzoin and Steri-Strips. Dressing was then applied, the  patient was awakened from general anesthesia and transported to the recovery room in stable condition. At the end of the procedure all sponge, needle and instrument counts were correct.   PLAN OF CARE: admit to inpatient  PATIENT DISPOSITION:  PACU - hemodynamically stable.   Delay start of Pharmacological VTE agent (>24hrs) due to surgical blood loss or risk of bleeding:  yes

## 2019-03-20 NOTE — Anesthesia Procedure Notes (Signed)
Procedure Name: Intubation Date/Time: 03/20/2019 7:42 AM Performed by: Pedro Oldenburg T, CRNA Pre-anesthesia Checklist: Emergency Drugs available, Patient identified, Suction available and Patient being monitored Patient Re-evaluated:Patient Re-evaluated prior to induction Oxygen Delivery Method: Circle system utilized Preoxygenation: Pre-oxygenation with 100% oxygen Induction Type: IV induction and Rapid sequence Laryngoscope Size: Mac and 3 Grade View: Grade I Tube type: Oral Tube size: 7.5 mm Number of attempts: 1 Airway Equipment and Method: Patient positioned with wedge pillow and Stylet Placement Confirmation: ETT inserted through vocal cords under direct vision,  positive ETCO2 and breath sounds checked- equal and bilateral Secured at: 22 cm Tube secured with: Tape Dental Injury: Teeth and Oropharynx as per pre-operative assessment

## 2019-03-20 NOTE — Progress Notes (Signed)
Pharmacy Antibiotic Note  Lisa Morrison is a 68 y.o. female admitted on 03/20/2019 for lumbar surgery.  PCN allergy.   Vancomycin 1gm IV given pre-op at 0725. Pharmacy has been consulted for Vancomycin dosing x 1 post-op.  No drain.   Plan:  Repeat Vancomycin 1gm IV at 8pm tonight.  No follow up needed.  Pharmacy to sign off.  Height: 5\' 5"  (165.1 cm) Weight: 180 lb 1.6 oz (81.7 kg) IBW/kg (Calculated) : 57  Temp (24hrs), Avg:97.9 F (36.6 C), Min:97.7 F (36.5 C), Max:98.3 F (36.8 C)  Recent Labs  Lab 03/16/19 1137  WBC 6.1  CREATININE 0.99    Estimated Creatinine Clearance: 59 mL/min (by C-G formula based on SCr of 0.99 mg/dL).    Allergies  Allergen Reactions  . Augmentin [Amoxicillin-Pot Clavulanate] Anaphylaxis and Rash    Did it involve swelling of the face/tongue/throat, SOB, or low BP? Yes Did it involve sudden or severe rash/hives, skin peeling, or any reaction on the inside of your mouth or nose? Yes Did you need to seek medical attention at a hospital or doctor's office? Yes When did it last happen?Over 10 years ago If all above answers are "NO", may proceed with cephalosporin use.  . Demerol [Meperidine] Other (See Comments)    Cardiac arrest    Antimicrobials this admission:   Vancomycin peri-op 7/13  Microbiology results: 7/9 MRSA PCR: negative  Thank you for allowing pharmacy to be a part of this patient's care.  Arty Baumgartner, [place] Pager: [phone] or phone: [phone] 03/20/2019 12:44 PM

## 2019-03-20 NOTE — Transfer of Care (Signed)
Immediate Anesthesia Transfer of Care Note  Patient: Lisa Morrison  Procedure(s) Performed: Posterior Lumbar Interbody Fusion  - Lumbar three-Lumbar four - Posterior Lateral and Interbody fusion (N/A Back)  Patient Location: PACU  Anesthesia Type:General  Level of Consciousness: awake, alert  and oriented  Airway & Oxygen Therapy: Patient Spontanous Breathing and Patient connected to nasal cannula oxygen  Post-op Assessment: Report given to RN, Post -op Vital signs reviewed and stable and Patient moving all extremities  Post vital signs: Reviewed and stable  Last Vitals:  Vitals Value Taken Time  BP 95/51 03/20/19 1102  Temp 36.5 C 03/20/19 1102  Pulse 71 03/20/19 1106  Resp 13 03/20/19 1106  SpO2 99 % 03/20/19 1106  Vitals shown include unvalidated device data.  Last Pain:  Vitals:   03/20/19 0609  TempSrc: Oral  PainSc:       Patients Stated Pain Goal: 5 (13/14/38 8875)  Complications: No apparent anesthesia complications

## 2019-03-20 NOTE — H&P (Signed)
Subjective: Patient is a 68 y.o. female admitted for PLIF. Onset of symptoms was several months ago, gradually worsening since that time.  The pain is rated severe, and is located at the across the lower back and radiates to legs. The pain is described as aching and occurs all day. The symptoms have been progressive. Symptoms are exacerbated by exercise. MRI or CT showed spondylolisthesis L3-4   Past Medical History:  Diagnosis Date  . Anxiety   . Arthritis   . Back pain   . CFS (chronic fatigue syndrome)   . Depression    denies now  . Dyspnea   . Edema    due to taking lyrica  . Family history of adverse reaction to anesthesia    son had n/v  . Fibromyalgia   . IBS (irritable bowel syndrome)   . Knee pain   . Leaky heart valve   . Migraine   . Orthostatic hypotension    from a  previous surgery   . Pneumonia    h/o  . PONV (postoperative nausea and vomiting)    and heart rate goes down " I fainted after surgery when standing in the restroom."  . Seasonal allergies   . Spondylolisthesis of lumbar region   . Wears glasses     Past Surgical History:  Procedure Laterality Date  . ABDOMINAL HYSTERECTOMY    . back ablasion     03/2018 and 05/2018  . BACK SURGERY  2015  . CHOLECYSTECTOMY    . HERNIA REPAIR    . OOPHORECTOMY    . VARICOSE VEIN SURGERY      Prior to Admission medications   Medication Sig Start Date End Date Taking? Authorizing Provider  acetaminophen (TYLENOL) 650 MG CR tablet Take 1,300 mg by mouth daily.   Yes [provider]  albuterol (VENTOLIN HFA) 108 (90 Base) MCG/ACT inhaler Inhale 2 puffs into the lungs every 4 (four) hours as needed for wheezing or shortness of breath.  09/02/18  Yes [provider]  AMITIZA 24 MCG capsule Take 24 mcg by mouth daily.  09/04/18  Yes [provider]  B Complex-C (SUPER B COMPLEX/VITAMIN C) TABS Take 1 tablet by mouth daily.   Yes [provider]  buPROPion (WELLBUTRIN SR) 150 MG 12 hr  tablet Take 150 mg by mouth 2 (two) times daily.   Yes [provider]  cetirizine (ZYRTEC) 10 MG tablet Take 10 mg by mouth daily.   Yes [provider]  clonazePAM (KLONOPIN) 0.5 MG tablet Take 0.25 mg by mouth 2 (two) times daily.    Yes [provider]  Multiple Vitamins-Minerals (CENTRUM SILVER 50+WOMEN) TABS Take 1 tablet by mouth daily.   Yes [provider]  Multiple Vitamins-Minerals (HAIR/SKIN/NAILS) CAPS Take 3 capsules by mouth daily.   Yes [provider]  Omega-3 Fatty Acids (ULTRA OMEGA-3 FISH OIL) 1400 MG CAPS Take 1,400 mg by mouth daily.   Yes [provider]  potassium chloride (MICRO-K) 10 MEQ CR capsule Take 20 mEq by mouth daily.    Yes [provider]  pregabalin (LYRICA) 150 MG capsule Take 150 mg by mouth at bedtime.    Yes [provider]  topiramate (TOPAMAX) 50 MG tablet Take 50 mg by mouth at bedtime.    Yes [provider]  triamterene-hydrochlorothiazide (DYAZIDE) 37.5-25 MG capsule Take 1 capsule by mouth daily.   Yes [provider]   Allergies  Allergen Reactions  . Augmentin [Amoxicillin-Pot Clavulanate] Anaphylaxis  and Rash    Did it involve swelling of the face/tongue/throat, SOB, or low BP? Yes Did it involve sudden or severe rash/hives, skin peeling, or any reaction on the inside of your mouth or nose? Yes Did you need to seek medical attention at a hospital or doctor's office? Yes When did it last happen?Over 10 years ago If all above answers are "NO", may proceed with cephalosporin use.  . Demerol [Meperidine] Other (See Comments)    Cardiac arrest    Social History   Tobacco Use  . Smoking status: Never Smoker  . Smokeless tobacco: Never Used  Substance Use Topics  . Alcohol use: Never    Frequency: Never    Family History  Problem Relation Age of Onset  . Mitral valve prolapse Mother   . Coronary aneurysm Father   . Diabetes Father      Review of  Systems  Positive ROS: neg  All other systems have been reviewed and were otherwise negative with the exception of those mentioned in the HPI and as above.  Objective: Vital signs in last 24 hours: Temp:  [98.3 F (36.8 C)] 98.3 F (36.8 C) (07/13 0609) Pulse Rate:  [55] 55 (07/13 0609) Resp:  [18] 18 (07/13 0609) BP: (110)/(62) 110/62 (07/13 0609) SpO2:  [97 %] 97 % (07/13 0609) Weight:  [81.7 kg] 81.7 kg (07/13 0543)  General Appearance: Alert, cooperative, no distress, appears stated age Head: Normocephalic, without obvious abnormality, atraumatic Eyes: PERRL, conjunctiva/corneas clear, EOM's intact    Neck: Supple, symmetrical, trachea midline Back: Symmetric, no curvature, ROM normal, no CVA tenderness Lungs:  respirations unlabored Heart: Regular rate and rhythm Abdomen: Soft, non-tender Extremities: Extremities normal, atraumatic, no cyanosis or edema Pulses: 2+ and symmetric all extremities Skin: Skin color, texture, turgor normal, no rashes or lesions  NEUROLOGIC:   Mental status: Alert and oriented x4,  no aphasia, good attention span, fund of knowledge, and memory Motor Exam - grossly normal Sensory Exam - grossly normal Reflexes: 1+ Coordination - grossly normal Gait - grossly normal Balance - grossly normal Cranial Nerves: I: smell Not tested  II: visual acuity  OS: nl    OD: nl  II: visual fields Full to confrontation  II: pupils Equal, round, reactive to light  III,VII: ptosis None  III,IV,VI: extraocular muscles  Full ROM  V: mastication Normal  V: facial light touch sensation  Normal  V,VII: corneal reflex  Present  VII: facial muscle function - upper  Normal  VII: facial muscle function - lower Normal  VIII: hearing Not tested  IX: soft palate elevation  Normal  IX,X: gag reflex Present  XI: trapezius strength  5/5  XI: sternocleidomastoid strength 5/5  XI: neck flexion strength  5/5  XII: tongue strength  Normal    Data Review Lab Results   Component Value Date   WBC 6.1 03/16/2019   HGB 14.5 03/16/2019   HCT 44.1 03/16/2019   MCV 94.4 03/16/2019   PLT 251 03/16/2019   Lab Results  Component Value Date   NA 140 03/16/2019   K 3.3 (L) 03/16/2019   CL 105 03/16/2019   CO2 26 03/16/2019   BUN 19 03/16/2019   CREATININE 0.99 03/16/2019   GLUCOSE 88 03/16/2019   Lab Results  Component Value Date   INR 0.9 11/18/2018    Assessment/Plan:  Estimated body mass index is 29.97 kg/m as calculated from the following:   Height as of this encounter: 5\' 5"  (1.651 m).  Weight as of this encounter: 81.7 kg. Patient admitted for PLIF L3-4. Patient has failed a reasonable attempt at conservative therapy.  I explained the condition and procedure to the patient and answered any questions.  Patient wishes to proceed with procedure as planned. Understands risks/ benefits and typical outcomes of procedure.   Eustace Moore 03/20/2019 7:19 AM

## 2019-03-20 NOTE — Anesthesia Postprocedure Evaluation (Signed)
Anesthesia Post Note  Patient: Lisa Morrison  Procedure(s) Performed: Posterior Lumbar Interbody Fusion  - Lumbar three-Lumbar four - Posterior Lateral and Interbody fusion (N/A Back)     Patient location during evaluation: PACU Anesthesia Type: General Level of consciousness: awake and alert Pain management: pain level controlled Vital Signs Assessment: post-procedure vital signs reviewed and stable Respiratory status: spontaneous breathing, nonlabored ventilation, respiratory function stable and patient connected to nasal cannula oxygen Cardiovascular status: blood pressure returned to baseline and stable Postop Assessment: no apparent nausea or vomiting Anesthetic complications: no    Last Vitals:  Vitals:   03/20/19 1147 03/20/19 1223  BP: (!) 100/53 116/65  Pulse: 65 67  Resp: 18   Temp: 36.5 C 36.5 C  SpO2: 96% 100%    Last Pain:  Vitals:   03/20/19 1223  TempSrc: Oral  PainSc:                  Donna Silverman L Thersa Mohiuddin

## 2019-03-21 MED ORDER — TRIAMTERENE-HCTZ 37.5-25 MG PO TABS
1.0000 | ORAL_TABLET | Freq: Every day | ORAL | Status: DC
  Filled 2019-03-21: qty 1

## 2019-03-21 MED ORDER — METHOCARBAMOL 500 MG PO TABS: 500.0000 mg | ORAL_TABLET | Freq: Three times a day (TID) | ORAL | 1 refills | Status: DC | PRN

## 2019-03-21 MED ORDER — OXYCODONE HCL 5 MG PO TABS: 5.0000 mg | ORAL_TABLET | ORAL | 0 refills | Status: DC | PRN

## 2019-03-21 NOTE — Discharge Summary (Signed)
Physician Discharge Summary  Patient ID: Lisa Morrison MRN: [id number] DOB/AGE: November 16, 1950 68 y.o.  Admit date: 03/20/2019 Discharge date: 03/21/2019  Admission Diagnoses: Spondylolisthesis with stenosis L3-4    Discharge Diagnoses: Same   Discharged Condition: good  Hospital Course: The patient was admitted on 03/20/2019 and taken to the operating room where the patient underwent lumbar decompression and fusion L3-4. The patient tolerated the procedure well and was taken to the recovery room and then to the floor in stable condition. The hospital course was routine. There were no complications. The wound remained clean dry and intact. Pt had appropriate back soreness. No complaints of leg pain or new N/T/W. The patient remained afebrile with stable vital signs, and tolerated a regular diet. The patient continued to increase activities, and pain was well controlled with oral pain medications.   Consults: None  Significant Diagnostic Studies:  Results for orders placed or performed during the hospital encounter of 03/16/19  SARS Coronavirus 2 (Performed in [facility] hospital lab)   Specimen: Nasal Swab  Result Value Ref Range   SARS Coronavirus 2 NEGATIVE NEGATIVE    Dg Lumbar Spine 2-3 Views  Result Date: 03/20/2019 CLINICAL DATA:  Posterior lumbar interbody fusion L3-4. EXAM: LUMBAR SPINE - 2-3 VIEW; DG C-ARM 61-120 MIN COMPARISON:  01/29/2018 lumbar spine MRI FINDINGS: Fluoroscopy time 1 minutes 7 seconds. 2 spot fluoroscopic intraoperative lumbar spine radiographs demonstrate postsurgical changes from bilateral posterior spinal fusion at L3-4. IMPRESSION: Intraoperative fluoroscopic guidance for PLIF L3-4. Electronically Signed   By: Ilona Sorrel M.D.   On: 03/20/2019 10:46   Dg C-arm 1-60 Min  Result Date: 03/20/2019 CLINICAL DATA:  Posterior lumbar interbody fusion L3-4. EXAM: LUMBAR SPINE - 2-3 VIEW; DG C-ARM 61-120 MIN COMPARISON:  01/29/2018 lumbar spine MRI FINDINGS:  Fluoroscopy time 1 minutes 7 seconds. 2 spot fluoroscopic intraoperative lumbar spine radiographs demonstrate postsurgical changes from bilateral posterior spinal fusion at L3-4. IMPRESSION: Intraoperative fluoroscopic guidance for PLIF L3-4. Electronically Signed   By: Ilona Sorrel M.D.   On: 03/20/2019 10:46    Antibiotics:  Anti-infectives (From admission, onward)   Start     Dose/Rate Route Frequency Ordered Stop   03/20/19 2000  vancomycin (VANCOCIN) IVPB 1000 mg/200 mL premix     1,000 mg 200 mL/hr over 60 Minutes Intravenous  Once 03/20/19 1244 03/20/19 2002   03/20/19 0731  bacitracin 50,000 Units in sodium chloride 0.9 % 500 mL irrigation  Status:  Discontinued       As needed 03/20/19 0842 03/20/19 1058   03/20/19 0600  vancomycin (VANCOCIN) IVPB 1000 mg/200 mL premix     1,000 mg 200 mL/hr over 60 Minutes Intravenous On call to O.R. 03/20/19 0538 03/20/19 0725      Discharge Exam: Blood pressure (!) 108/56, pulse 60, temperature 97.7 F (36.5 C), temperature source Oral, resp. rate 18, height 5\' 5"  (1.651 m), weight 81.7 kg, SpO2 97 %. Neurologic: Grossly normal Dressing dry  Discharge Medications:   Allergies as of 03/21/2019      Reactions   Augmentin [amoxicillin-pot Clavulanate] Anaphylaxis, Rash   Did it involve swelling of the face/tongue/throat, SOB, or low BP? Yes Did it involve sudden or severe rash/hives, skin peeling, or any reaction on the inside of your mouth or nose? Yes Did you need to seek medical attention at a hospital or doctor's office? Yes When did it last happen?Over 10 years ago If all above answers are "NO", may proceed with cephalosporin use.   Demerol [meperidine]  Other (See Comments)   Cardiac arrest      Medication List    TAKE these medications   acetaminophen 650 MG CR tablet Commonly known as: TYLENOL Take 1,300 mg by mouth daily.   Amitiza 24 MCG capsule Generic drug: lubiprostone Take 24 mcg by mouth daily.   buPROPion 150 MG  12 hr tablet Commonly known as: WELLBUTRIN SR Take 150 mg by mouth 2 (two) times daily.   cetirizine 10 MG tablet Commonly known as: ZYRTEC Take 10 mg by mouth daily.   clonazePAM 0.5 MG tablet Commonly known as: KLONOPIN Take 0.25 mg by mouth 2 (two) times daily.   Hair/Skin/Nails Caps Take 3 capsules by mouth daily.   Centrum Silver 50+Women Tabs Take 1 tablet by mouth daily.   methocarbamol 500 MG tablet Commonly known as: ROBAXIN Take 1 tablet (500 mg total) by mouth every 8 (eight) hours as needed for muscle spasms.   oxyCODONE 5 MG immediate release tablet Commonly known as: Oxy IR/ROXICODONE Take 1 tablet (5 mg total) by mouth every 4 (four) hours as needed for moderate pain ((score 4 to 6)).   potassium chloride 10 MEQ CR capsule Commonly known as: MICRO-K Take 20 mEq by mouth daily.   pregabalin 150 MG capsule Commonly known as: LYRICA Take 150 mg by mouth at bedtime.   Super B Complex/Vitamin C Tabs Take 1 tablet by mouth daily.   topiramate 50 MG tablet Commonly known as: TOPAMAX Take 50 mg by mouth at bedtime.   triamterene-hydrochlorothiazide 37.5-25 MG capsule Commonly known as: DYAZIDE Take 1 capsule by mouth daily.   Ultra Omega-3 Fish Oil 1400 MG Caps Take 1,400 mg by mouth daily.   Ventolin HFA 108 (90 Base) MCG/ACT inhaler Generic drug: albuterol Inhale 2 puffs into the lungs every 4 (four) hours as needed for wheezing or shortness of breath.            Durable Medical Equipment  (From admission, onward)         Start     Ordered   03/20/19 1209  DME Walker rolling  Once    Question:  Patient needs a walker to treat with the following condition  Answer:  S/P lumbar fusion   03/20/19 1208   03/20/19 1209  DME 3 n 1  Once     03/20/19 1208          Disposition: home   Final Dx: PLIF L3-4  Discharge Instructions     Remove dressing in 72 hours   Complete by: As directed    Call MD for:  difficulty breathing, headache or  visual disturbances   Complete by: As directed    Call MD for:  persistant nausea and vomiting   Complete by: As directed    Call MD for:  redness, tenderness, or signs of infection (pain, swelling, redness, odor or green/yellow discharge around incision site)   Complete by: As directed    Call MD for:  severe uncontrolled pain   Complete by: As directed    Call MD for:  temperature >100.4   Complete by: As directed    Diet - low sodium heart healthy   Complete by: As directed    Increase activity slowly   Complete by: As directed       Follow-up Information    Eustace Moore, MD. Schedule an appointment as soon as possible for a visit in 2 week(s).   Specialty: Neurosurgery Contact information: [address]  [address] [place] [address] [phone]            Signed: Eustace Moore 03/21/2019, 8:08 AM

## 2019-03-21 NOTE — Evaluation (Signed)
Physical Therapy Evaluation Patient Details Name: Lisa Morrison MRN: [id number] DOB: 1951-03-21 Today's Date: 03/21/2019   History of Present Illness  Pt is a 69 y/o female who presents s/p L3-L4 PLIF on 03/20/2019.   Clinical Impression  Pt admitted with above diagnosis. Pt currently with functional limitations due to the deficits listed below (see PT Problem List). At the time of PT eval pt was able to perform transfers and ambulation with gross min guard assist to supervision for safety without an AD. Pt moving overall slow but generally steady. She was educated on precautions, brace application/wearing schedule, car transfer, and activity progression. Pt will benefit from skilled PT to increase their independence and safety with mobility to allow discharge to the venue listed below.       Follow Up Recommendations No PT follow up;Supervision for mobility/OOB    Equipment Recommendations  None recommended by PT    Recommendations for Other Services       Precautions / Restrictions Precautions Precautions: Fall;Back Precaution Booklet Issued: Yes (comment) Precaution Comments: Reviewed handout in detail. Pt was cued for maintenance of precautions during functional mobility.  Required Braces or Orthoses: Spinal Brace Spinal Brace: Lumbar corset;Applied in sitting position Restrictions Weight Bearing Restrictions: No      Mobility  Bed Mobility Overal bed mobility: Needs Assistance Bed Mobility: Rolling;Sidelying to Sit Rolling: Modified independent (Device/Increase time) Sidelying to sit: Supervision       General bed mobility comments: VC's for sequencing and proper log roll technique. Pt was able to transition to EOB without assistance.   Transfers Overall transfer level: Needs assistance Equipment used: None Transfers: Sit to/from Stand Sit to Stand: Supervision         General transfer comment: VC's for hand placement on seated surface for safety.    Ambulation/Gait Ambulation/Gait assistance: Min guard Gait Distance (Feet): 200 Feet Assistive device: None Gait Pattern/deviations: Step-through pattern;Decreased stride length;Trunk flexed Gait velocity: Decreased Gait velocity interpretation: <1.8 ft/sec, indicate of risk for recurrent falls General Gait Details: Slow and guarded without AD. No overt LOB however felt I needed to closely guard for safety.   Stairs Stairs: Yes Stairs assistance: Min guard Stair Management: One rail Right;Step to pattern;Forwards Number of Stairs: 4 General stair comments: VC's for sequencing and general safety.   Wheelchair Mobility    Modified Rankin (Stroke Patients Only)       Balance Overall balance assessment: Needs assistance   Sitting balance-Leahy Scale: Good     Standing balance support: No upper extremity supported Standing balance-Leahy Scale: Fair                               Pertinent Vitals/Pain Pain Assessment: Faces Faces Pain Scale: Hurts a little bit Pain Location: back Pain Descriptors / Indicators: Discomfort Pain Intervention(s): Monitored during session    Home Living Family/patient expects to be discharged to:: Private residence Living Arrangements: Spouse/significant other Available Help at Discharge: Family;Available 24 hours/day Type of Home: House Home Access: Stairs to enter Entrance Stairs-Rails: Right Entrance Stairs-Number of Steps: 4 Home Layout: Two level Home Equipment: None      Prior Function Level of Independence: Independent               Hand Dominance   Dominant Hand: Right    Extremity/Trunk Assessment   Upper Extremity Assessment Upper Extremity Assessment: Overall WFL for tasks assessed    Lower Extremity Assessment Lower Extremity Assessment:  Generalized weakness(Consistent with pre-op diagnosis)    Cervical / Trunk Assessment Cervical / Trunk Assessment: Other exceptions(spinal surgery)   Communication   Communication: No difficulties  Cognition Arousal/Alertness: Awake/alert Behavior During Therapy: WFL for tasks assessed/performed Overall Cognitive Status: Within Functional Limits for tasks assessed                                        General Comments General comments (skin integrity, edema, etc.): Pt voiced concerns about getting depressed and gaining weight because that happended after her last surgery. Listened to pt a nd encouraged pt to discuss with her MD if she began to have symptoms of depression. Educated pt on coping strategies.     Exercises     Assessment/Plan    PT Assessment Patient needs continued PT services  PT Problem List Decreased strength;Decreased activity tolerance;Decreased balance;Decreased mobility;Decreased knowledge of use of DME;Decreased safety awareness;Decreased knowledge of precautions;Pain       PT Treatment Interventions DME instruction;Gait training;Stair training;Functional mobility training;Therapeutic activities;Therapeutic exercise;Neuromuscular re-education;Patient/family education    PT Goals (Current goals can be found in the Care Plan section)  Acute Rehab PT Goals Patient Stated Goal: Be able to go for walks with her granddaughter PT Goal Formulation: With patient Potential to Achieve Goals: Good    Frequency Min 5X/week   Barriers to discharge        Co-evaluation               AM-PAC PT "6 Clicks" Mobility  Outcome Measure Help needed turning from your back to your side while in a flat bed without using bedrails?: None Help needed moving from lying on your back to sitting on the side of a flat bed without using bedrails?: None Help needed moving to and from a bed to a chair (including a wheelchair)?: A Little Help needed standing up from a chair using your arms (e.g., wheelchair or bedside chair)?: A Little Help needed to walk in hospital room?: A Little Help needed climbing 3-5  steps with a railing? : A Little 6 Click Score: 20    End of Session Equipment Utilized During Treatment: Gait belt;Back brace Activity Tolerance: Patient tolerated treatment well Patient left: in chair;with call bell/phone within reach Nurse Communication: Mobility status PT Visit Diagnosis: Unsteadiness on feet (R26.81);Other symptoms and signs involving the nervous system (R29.898);Pain Pain - part of body: (back)    Time: 9191-6606 PT Time Calculation (min) (ACUTE ONLY): 36 min   Charges:   PT Evaluation $PT Eval Moderate Complexity: 1 Mod PT Treatments $Gait Training: 8-22 mins        Rolinda Roan, PT, DPT Acute Rehabilitation Services Pager: [phone] Office: [phone]   Lisa Morrison 03/21/2019, 12:36 PM

## 2019-03-21 NOTE — Progress Notes (Signed)
OT Evaluation  Completed all education regarding compensatory techniques and use of AE to assist with ADL. Pt able to return demonstrate. Needs 3in1 for home use. No further OT needs.     03/21/19 1042  OT Visit Information  Last OT Received On 03/21/19  Assistance Needed +1  History of Present Illness Pt is a 68 y/o female who presents s/p L3-L4 PLIF on 03/20/2019.   Precautions  Precautions Fall;Back  Precaution Booklet Issued Yes (comment)  Required Braces or Orthoses Spinal Brace  Spinal Brace Lumbar corset;Applied in sitting position  Restrictions  Weight Bearing Restrictions No  Home Living  Family/patient expects to be discharged to: Private residence  Living Arrangements Spouse/significant other  Available Help at Discharge Family;Available 24 hours/day  Type of Home House  Home Access Stairs to enter  Entrance Stairs-Number of Steps 4  Entrance Stairs-Rails Right  Home Layout Two level  Alternate Level Stairs-Number of Steps 1 from foyer  Engineer, manufacturing systems Yes  How Accessible Accessible via walker  Home Equipment None  Prior Function  Level of Independence Independent  Communication  Communication No difficulties  Pain Assessment  Pain Assessment Faces  Faces Pain Scale 2  Pain Location back  Pain Descriptors / Indicators Discomfort  Pain Intervention(s) Limited activity within patient's tolerance  Cognition  Arousal/Alertness Awake/alert  Behavior During Therapy WFL for tasks assessed/performed  Overall Cognitive Status Within Functional Limits for tasks assessed  Upper Extremity Assessment  Upper Extremity Assessment Overall WFL for tasks assessed  Lower Extremity Assessment  Lower Extremity Assessment Defer to PT evaluation  Cervical / Trunk Assessment  Cervical / Trunk Assessment Other exceptions (back fusion)  ADL  Overall ADL's  Needs assistance/impaired  Functional mobility during  ADLs Supervision/safety  General ADL Comments Able to complete dressing with S after educaing on back precautions. Pt dressed using figure 4 techniques and did not need AE. Educated on technqieus for bathing. Pt will need to use AE for hygiene after toileting and when bathing. Pt verbalized understanding. Educated on proper set up for grooming. Pt asking about laundry and IADL tasks. Educated on use of reacher for washing machine but stressed importance of pt taking it easy until she sees Dr. Ronnald Ramp. Pt verbalized understanding.   Bed Mobility  General bed mobility comments OOB in chair  Transfers  Overall transfer level Needs assistance  Transfers Sit to/from Stand  Sit to Stand Supervision  General transfer comment vc to not bemd over  Balance  Overall balance assessment Needs assistance  Sitting balance-Leahy Scale Good  Standing balance-Leahy Scale Fair  General Comments  General comments (skin integrity, edema, etc.) Pt voiced concerns about getting depressed and gaining weight because that happended after her last surgery. Listened to pt a nd encouraged pt to discuss with her MD if she began to have symptoms of depression. Educated pt on coping strategies.   OT - End of Session  Equipment Utilized During Treatment Back brace  Activity Tolerance Patient tolerated treatment well  Patient left in chair;with call bell/phone within reach  Nurse Communication Mobility status;Other (comment) (DC needs)  OT Assessment  OT Recommendation/Assessment Patient does not need any further OT services  OT Visit Diagnosis Other abnormalities of gait and mobility (R26.89);Pain;Muscle weakness (generalized) (M62.81)  Pain - part of body  (back)  OT Problem List Decreased activity tolerance;Decreased safety awareness;Decreased knowledge of use of DME or AE;Decreased knowledge of precautions;Pain  AM-PAC OT "6 Clicks" Daily Activity  Outcome Measure (Version 2)  Help from another person eating meals? 4   Help from another person taking care of personal grooming? 3  Help from another person toileting, which includes using toliet, bedpan, or urinal? 3  Help from another person bathing (including washing, rinsing, drying)? 3  Help from another person to put on and taking off regular upper body clothing? 3  Help from another person to put on and taking off regular lower body clothing? 3  6 Click Score 19  OT Recommendation  Follow Up Recommendations No OT follow up;Supervision - Intermittent  OT Equipment 3 in 1 bedside commode  Individuals Consulted  Consulted and Agree with Results and Recommendations Patient  Acute Rehab OT Goals  Patient Stated Goal To go to [employer] with her grand daughter  OT Goal Formulation All assessment and education complete, DC therapy  OT Time Calculation  OT Start Time (ACUTE ONLY) [phone]  OT Stop Time (ACUTE ONLY) 1006  OT Time Calculation (min) 44 min  OT General Charges  $OT Visit 1 Visit  OT Evaluation  $OT Eval Low Complexity 1 Low  OT Treatments  $Self Care/Home Management  23-37 mins  Written Expression  Dominant Hand Right  Maurie Boettcher, OT/L   Acute OT Clinical Specialist Acute Rehabilitation Services Pager [phone] Office [phone]

## 2019-03-21 NOTE — Discharge Instructions (Signed)
Wound Care Keep incision covered and dry for one week.  If you shower prior to then, cover incision with plastic wrap.  You may remove outer bandage after one week and shower.  Do not put any creams, lotions, or ointments on incision. Leave steri-strips on neck.  They will fall off by themselves. Activity Walk each and every day, increasing distance each day. No lifting greater than 5 lbs.  Avoid bending, arching, or twisting. No driving for 2 weeks; may ride as a passenger locally. If provided with back brace, wear when out of bed.  It is not necessary to wear in bed. Diet Resume your normal diet.  Return to Work Will be discussed at you follow up appointment. Call Your Doctor If Any of These Occur Redness, drainage, or swelling at the wound.  Temperature greater than 101 degrees. Severe pain not relieved by pain medication. Incision starts to come apart. Follow Up Appt Call today for appointment in 1-2 weeks ([phone]) or for problems.  If you have any hardware placed in your spine, you will need an x-ray before your appointment.   Spinal Fusion, Adult, Care After This sheet gives you information about how to care for yourself after your procedure. Your doctor may also give you more specific instructions. If you have problems or questions, contact your doctor. Follow these instructions at home: Medicines  Take over-the-counter and prescription medicines only as told by your doctor. These include any medicines for pain or blood-thinning medicines (anticoagulants).  If you were prescribed an antibiotic medicine, take it as told by your doctor. Do not stop taking the antibiotic even if you start to feel better.  Do not drive for 24 hours if you were given a medicine to help you relax (sedative) during your procedure.  Do not drive or use heavy machinery while taking prescription pain medicine. If you have a brace:  Wear the brace as told by your doctor. Take it off only as told by  your doctor.  Keep the brace clean. Managing pain, stiffness, and swelling  If directed, put ice on the surgery area: ? If you have a removable brace, take it off as told by your doctor. ? Put ice in a plastic bag. ? Place a towel between your skin and the bag. ? Leave the ice on for 20 minutes, 2-3 times a day. Surgery cut care   Follow instructions from your doctor about how to take care of your cut from surgery (incision). Make sure you: ? Wash your hands with soap and water before you change your bandage (dressing). If you cannot use soap and water, use hand sanitizer. ? Change your bandage as told by your doctor. ? Leave stitches (sutures), skin glue, or skin tape (adhesive) strips in place. They may need to stay in place for 2 weeks or longer. If tape strips get loose and curl up, you may trim the loose edges. Do not remove tape strips completely unless your doctor says it is okay.  Keep your cut from surgery clean and dry. ? Do not take baths, swim, or use a hot tub until your doctor says it is okay. ? Ask your doctor if you can take showers. You may only be allowed to take sponge baths.  Every day, check your cut from surgery and the area around it for: ? More redness, swelling, or pain. ? Fluid or blood. ? Warmth. ? Pus or a bad smell.  If you have a drain tube, follow instructions  from your doctor about caring for it. Do not take out the drain tube or any bandages unless your doctor says it is okay. Physical activity  Rest and protect your back as much as possible.  Follow instructions from your doctor about how to move. Use good posture to help your spine heal.  Do not lift anything that is heavier than 8 lb (3.6 kg), or the limit that you are told, until your doctor says that it is safe.  Do not twist or bend at the waist until your doctor says it is okay.  It is best if you: ? Do not make pushing and pulling motions. ? Do not sit or lie down in the same position  for a long time. ? Do not raise your hands or arms above your head.  Return to your normal activities as told by your doctor. Ask your doctor what activities are safe for you. Rest and protect your back as much as you can.  Do not start to exercise until your doctor says it is okay. Ask your doctor what kinds of exercise you can do to make your back stronger. General instructions  To prevent blood clots and lessen swelling in your legs: ? Wear compression stockings as told. ? Walk one or more times every few hours as told by your doctor.  Do not use any products that contain nicotine or tobacco, such as cigarettes and e-cigarettes. These can delay bone healing. If you need help quitting, ask your doctor.  To prevent or treat constipation while you are taking prescription pain medicine, your doctor may suggest that you: ? Drink enough fluid to keep your pee (urine) pale yellow. ? Take over-the-counter or prescription medicines. ? Eat foods that are high in fiber. These include fresh fruits and vegetables, whole grains, and beans. ? Limit foods that are high in fat and processed sugars, such as fried and sweet foods.  Keep all follow-up visits as told by your doctor. This is important. Contact a doctor if:  Your pain gets worse.  Your medicine does not help your pain.  Your legs or feet get painful or swollen.  Your cut from surgery is more red, swollen, or painful.  Your cut from surgery feels warm to the touch.  You have: ? Fluid or blood coming from your cut from surgery. ? Pus or a bad smell coming from your cut from surgery. ? A fever. ? Weakness or loss of feeling (numbness) in your legs that is new or getting worse. ? Trouble controlling when you pee (urinate) or poop (have a bowel movement).  You feel sick to your stomach (nauseous).  You throw up (vomit). Get help right away if:  Your pain is very bad.  You have chest pain.  You have trouble breathing.  You  start to have a cough. These symptoms may be an emergency. Do not wait to see if the symptoms will go away. Get medical help right away. Call your local emergency services (911 in the [place] Do not drive yourself to the hospital. Summary  After the procedure, it is common to have pain in your back and pain by your surgery cut(s).  Icing and pain medicines may help to control the pain. Follow directions from your doctor.  Rest and protect your back as much as possible. Do not twist or bend at the waist.  Get up and walk one or more times every few hours as told by your doctor. This information  is not intended to replace advice given to you by your health care provider. Make sure you discuss any questions you have with your health care provider. Document Released: 12/18/2010 Document Revised: 12/15/2018 Document Reviewed: 12/08/2016 Elsevier Patient Education  [place].

## 2019-03-21 NOTE — Progress Notes (Signed)
Pt doing well. Pt given D/C instructions with verbal understanding. Rx's were sent to pharmacy by MD. Pt's incision is clean and dry with no sign of infection. Pt's IV was removed prior to D/C. Pt received 3-n-1 per MD order. Pt D/C'd home via wheelchair per MD order. Pt is stable @ D/C and has no other needs at this time. Holli Humbles, RN

## 2019-04-04 ENCOUNTER — Ambulatory Visit: Payer: Medicare PPO | Admitting: Podiatry

## 2019-04-11 ENCOUNTER — Other Ambulatory Visit: Payer: Self-pay

## 2019-04-11 ENCOUNTER — Encounter: Payer: Self-pay | Admitting: Podiatry

## 2019-04-11 ENCOUNTER — Ambulatory Visit: Payer: Medicare PPO | Admitting: Podiatry

## 2019-04-11 VITALS — Temp 97.3°F

## 2019-04-11 DIAGNOSIS — M79675 Pain in left toe(s): Secondary | ICD-10-CM

## 2019-04-11 DIAGNOSIS — B351 Tinea unguium: Secondary | ICD-10-CM

## 2019-04-11 DIAGNOSIS — Q828 Other specified congenital malformations of skin: Secondary | ICD-10-CM

## 2019-04-11 DIAGNOSIS — M79674 Pain in right toe(s): Secondary | ICD-10-CM

## 2019-04-12 NOTE — Progress Notes (Signed)
Subjective: 68 year old female presents the office they for reoccurring calluses of the left foot that has again become painful again.  Overall been doing better since she has not been walking as much as she recently had back surgery.  She is also asking the nails be trimmed today as she cannot do it as she cannot bend over and there is thickened elongated she cannot trim them herself. She denies any recent injury or trauma.  She has no other concerns.    Objective: AAO x3, NAD DP/PT pulses palpable bilaterally, CRT less than 3 seconds There is prominence of metatarsal heads plantarly with atrophy of the fat pad. Hyperkeratotic tissue is present left foot submetatarsal 3. Upon debridement there is no underlying ulceration, drainage or any signs of infection. There is no other open lesions or pre-ulcerative lesions identified today. Tenderness over the hyperkeratotic lesion but no other areas of tenderness. Nails are hypertrophic, dystrophic, brittle, discolored, elongated 67. No surrounding redness or drainage. Tenderness nails 1-5 bilaterally. No open lesions or pre-ulcerative lesions are identified today. No pain with calf compression, swelling, warmth, erythema No acute changes otherwsie  Assessment: Left foot porokeratosis; hallux ingrown toenail  Plan: -All treatment options discussed with the patient including all alternatives, risks, complications.  -Lesion sharply debrided. X 1 without complications or bleeding. Offloading pads.  Continue shoe modifications -Nails debrided V72 without complications or bleeding debridement there is resolution of pain.  Monitor for any reoccurrence. -Daily foot inspection  Celesta Gentile, DPM

## 2019-07-07 ENCOUNTER — Other Ambulatory Visit: Payer: Self-pay

## 2019-07-07 DIAGNOSIS — Z20822 Contact with and (suspected) exposure to covid-19: Secondary | ICD-10-CM

## 2019-07-08 LAB — NOVEL CORONAVIRUS, NAA: SARS-CoV-2, NAA: NOT DETECTED

## 2019-07-10 ENCOUNTER — Ambulatory Visit: Payer: Medicare PPO | Admitting: Podiatry

## 2019-07-18 ENCOUNTER — Encounter: Payer: Self-pay | Admitting: Podiatry

## 2019-07-18 ENCOUNTER — Other Ambulatory Visit: Payer: Self-pay

## 2019-07-18 ENCOUNTER — Ambulatory Visit: Payer: Medicare PPO | Admitting: Podiatry

## 2019-07-18 DIAGNOSIS — B351 Tinea unguium: Secondary | ICD-10-CM | POA: Diagnosis not present

## 2019-07-18 DIAGNOSIS — M79674 Pain in right toe(s): Secondary | ICD-10-CM

## 2019-07-18 DIAGNOSIS — L600 Ingrowing nail: Secondary | ICD-10-CM

## 2019-07-18 DIAGNOSIS — M79675 Pain in left toe(s): Secondary | ICD-10-CM

## 2019-07-18 DIAGNOSIS — Q828 Other specified congenital malformations of skin: Secondary | ICD-10-CM

## 2019-07-24 NOTE — Progress Notes (Signed)
Subjective: 68 year old female presents the office they for reoccurring calluses of the left foot that has again become painful again.  She is the callus has not become metastatic.  She also has me to recheck the left big toenail as it was getting ingrown and she gets occasional discomfort.  Denies any redness or drainage or any swelling to the callus or toenail site.  She has no other concerns.    Objective: AAO x3, NAD DP/PT pulses palpable bilaterally, CRT less than 3 seconds There is prominence of metatarsal heads plantarly with atrophy of the fat pad. Hyperkeratotic tissue is present left foot submetatarsal 3. There is no underlying ulceration, drainage or any signs of infection. There is no other open lesions or pre-ulcerative lesions identified today. Tenderness over the hyperkeratotic lesion but no other areas of tenderness. Mild ingrown left hallux toenail.  No drainage or pus.  No significant discomfort today. No pain with calf compression, swelling, warmth, erythema No acute changes otherwsie  Assessment: Left foot porokeratosis; hallux ingrown toenail  Plan: -All treatment options discussed with the patient including all alternatives, risks, complications.  -Lesion sharply debrided. X 1 without complications or bleeding. Offloading pads.  Continue shoe modifications -Daily foot inspection  Celesta Gentile, DPM

## 2019-09-13 ENCOUNTER — Telehealth: Payer: Self-pay | Admitting: *Deleted

## 2019-09-13 NOTE — Telephone Encounter (Signed)
Pt states she is returning Lisa's call.

## 2019-09-15 NOTE — Telephone Encounter (Signed)
Called and advised patient of the appointment on January 11 th , 2021 at 11:30 am. Lisa Morrison

## 2019-09-18 ENCOUNTER — Ambulatory Visit: Payer: Medicare PPO | Admitting: Podiatry

## 2019-09-18 ENCOUNTER — Other Ambulatory Visit: Payer: Self-pay

## 2019-09-18 DIAGNOSIS — S86891A Other injury of other muscle(s) and tendon(s) at lower leg level, right leg, initial encounter: Secondary | ICD-10-CM

## 2019-09-18 DIAGNOSIS — M216X2 Other acquired deformities of left foot: Secondary | ICD-10-CM | POA: Diagnosis not present

## 2019-09-18 DIAGNOSIS — M216X1 Other acquired deformities of right foot: Secondary | ICD-10-CM | POA: Diagnosis not present

## 2019-09-18 MED ORDER — IBUPROFEN 600 MG PO TABS: 600.0000 mg | ORAL_TABLET | Freq: Three times a day (TID) | ORAL | 0 refills | Status: DC | PRN

## 2019-09-18 NOTE — Patient Instructions (Signed)
Shin Splints Rehab Ask your health care provider which exercises are safe for you. Do exercises exactly as told by your health care provider and adjust them as directed. It is normal to feel mild stretching, pulling, tightness, or discomfort as you do these exercises. Stop right away if you feel sudden pain or your pain gets worse. Do not begin these exercises until told by your health care provider. Stretching and range-of-motion exercise This exercise warms up your muscles and joints and improves the movement and flexibility of your lower leg. This exercise also helps to relieve pain. Calf stretch, standing  1. Stand with the ball of your left / right foot on a step. The ball of your foot is on the walking surface, right under your toes. 2. Keep your other foot firmly on the same step. 3. Hold on to the wall, a railing, or a chair for balance. 4. Slowly lift your other foot, allowing your body weight to press your left / right heel down over the edge of the step. You should feel a stretch in your left / right calf. 5. Hold this position for __________ seconds. 6. Repeat this exercise with a slight bend in your left / right knee. Repeat __________ times with your left / right knee straight and __________ times with your left / right knee bent. Complete this exercise __________ times a day. Strengthening exercises These exercises build strength and endurance in your lower leg. Endurance is the ability to use your muscles for a long time, even after they get tired. Dorsiflexion with band  1. Secure a rubber exercise band or tubing to a fixed object, such as a table leg or a pole. 2. Secure the other end of the band around your left / right foot. 3. Sit on the floor, facing the fixed object with your left / right leg extended. The band should be slightly tense when your foot is relaxed. 4. Slowly use your ankle muscles to pull your foot toward you (dorsiflexion). 5. Hold this position for  __________ seconds. 6. Slowly release the tension in the band and return your foot to the starting position. Repeat __________ times. Complete this exercise __________ times a day. Ankle eversion with band 1. Secure one end of a rubber exercise band or tubing to a fixed object, such as a table leg or a pole, that will stay in place when the band is pulled. 2. Loop the other end of the band around the middle of your left / right foot. 3. Sit on the floor, facing the fixed object. The band should be slightly tense when your foot is relaxed. 4. Make fists with your hands and put them between your knees. This will focus your strengthening at your ankle. 5. Leading with your little toe, slowly push your banded foot outward, away from your other leg (eversion). Make sure the band is positioned to resist the entire motion. 6. Hold this position for __________ seconds. 7. Control the tension in the band as you slowly return your foot to the starting position. Repeat __________ times. Complete this exercise __________ times a day. Ankle inversion with band 1. Secure one end of a rubber exercise band or tubing to a fixed object, such as a table leg or a pole, that will stay in place when the band is pulled. 2. Loop the other end of the band around your left / right foot, just below your toes. 3. Sit on the floor, facing the fixed object. The  band should be slightly tense when your foot is relaxed. 4. Make fists with your hands and put them between your knees. This will focus your strengthening at your ankle. 5. Leading with your big toe, slowly pull your banded foot inward, toward your other leg (inversion). Make sure the band is positioned to resist the entire motion. 6. Hold this position for __________ seconds. 7. Control the tension in the band as you slowly return your foot to the starting position. Repeat __________ times. Complete this exercise __________ times a day. Lateral walking with  band This is an exercise in which you walk sideways (lateral), with tension provided by an exercise band. 1. Stand in a long hallway. 2. Wrap a loop of exercise band around your legs, just above your knees. 3. Bend your knees gently and drop your hips down and back so your weight is over your heels. 4. Step to the side to move down the length of the hallway, keeping your toes pointed forward and keeping tension in the band. 5. Repeat, leading with your other leg. Repeat __________ times. Complete this exercise __________ times a day. Balance exercise This exercise will help improve your control of your foot and ankle when you are standing or walking. Single leg stance 1. Without wearing shoes, stand near a railing or in a doorway. You may hold on to the railing or door frame as needed. 2. Stand on your left / right foot. Keep your big toe down on the floor and try to keep your arch lifted. 3. If this exercise is too easy, you can try doing it with your eyes closed or while standing on a pillow. 4. Hold this position for __________ seconds. Repeat __________ times. Complete this exercise __________ times a day. This information is not intended to replace advice given to you by your health care provider. Make sure you discuss any questions you have with your health care provider. Document Revised: 12/16/2018 Document Reviewed: 07/05/2018 Elsevier Patient Education  2020 [employer]  

## 2019-09-22 NOTE — Progress Notes (Signed)
Subjective: 69 year old female presents the office today for concerns of pain to her right leg and she feels that it is shinsplints.  This started after her husband had surgery and she has been helping him and she is been on her feet more.  She did see her back doctor and he was thinking it was coming from her back but she feels it is shinsplints.  She also states that she tries to wear shoes around the house but the right foot is worn out and may be contributed to her symptoms.  She denies any recent injury no swelling. Denies any systemic complaints such as fevers, chills, nausea, vomiting. No acute changes since last appointment, and no other complaints at this time.   Objective: AAO x3, NAD DP/PT pulses palpable bilaterally, CRT less than 3 seconds There is tenderness on the anterior aspect of the leg on the extensor musculature.  No area of pinpoint tenderness.  No pain at the foot. No open lesions or pre-ulcerative lesions.  No pain with calf compression, swelling, warmth, erythema  Assessment: Tendinitis, shinsplints  Plan: -All treatment options discussed with the patient including all alternatives, risks, complications.  -Discussed traction, rehab.  Anti-inflammatories as needed and also ice the area as well.  We discussed changing shoes and getting new shoes with better support of the shoes that she wears around the house are worn out. -Patient encouraged to call the office with any questions, concerns, change in symptoms.   Trula Slade DPM

## 2019-10-20 ENCOUNTER — Ambulatory Visit: Payer: Medicare PPO

## 2019-11-03 ENCOUNTER — Encounter: Payer: Self-pay | Admitting: Podiatry

## 2019-11-03 ENCOUNTER — Ambulatory Visit (INDEPENDENT_AMBULATORY_CARE_PROVIDER_SITE_OTHER): Payer: Medicare PPO | Admitting: Podiatry

## 2019-11-03 ENCOUNTER — Other Ambulatory Visit: Payer: Self-pay

## 2019-11-03 VITALS — Temp 97.0°F

## 2019-11-03 DIAGNOSIS — Q828 Other specified congenital malformations of skin: Secondary | ICD-10-CM | POA: Diagnosis not present

## 2019-11-03 DIAGNOSIS — B351 Tinea unguium: Secondary | ICD-10-CM | POA: Diagnosis not present

## 2019-11-03 DIAGNOSIS — M79674 Pain in right toe(s): Secondary | ICD-10-CM

## 2019-11-03 DIAGNOSIS — M79675 Pain in left toe(s): Secondary | ICD-10-CM | POA: Diagnosis not present

## 2019-11-03 NOTE — Progress Notes (Signed)
Subjective: 69 year old female presents the office they for reoccurring calluses of the left foot which is causing discomfort as well as for painful, elongated toenails that she cannot trim herself. No redness, drainage, or signs of infection. No swelling to the feet.  Denies any redness or drainage or any swelling to the callus or toenail site.  She has no other concerns.    Objective: AAO x3, NAD DP/PT pulses palpable bilaterally, CRT less than 3 seconds Nails are hypertrophic, dystrophic, brittle, discolored, elongated 10. No surrounding redness or drainage. Tenderness nails 1-5 bilaterally. No open lesions or pre-ulcerative lesions are identified today. Hyperkeratotic tissue is present left foot submetatarsal 3 and left medial hallux. There is no underlying ulceration, drainage or any signs of infection. There is no other open lesions or pre-ulcerative lesions identified today.  Mild ingrown left hallux toenail.  No drainage or pus.  No significant discomfort today. No pain with calf compression, swelling, warmth, erythema No acute changes otherwsie  Assessment: Left foot porokeratosis; symptomatic onychomycosis   Plan: -All treatment options discussed with the patient including all alternatives, risks, complications.  -Nails sharply debrided x 10 without complications or bleeding.  -Lesion sharply debrided. X 1 without complications or bleeding. Offloading pads.  Continue shoe modifications -Daily foot inspection  Celesta Gentile, DPM

## 2019-11-07 DIAGNOSIS — Z6829 Body mass index (BMI) 29.0-29.9, adult: Secondary | ICD-10-CM | POA: Insufficient documentation

## 2019-12-15 ENCOUNTER — Ambulatory Visit: Payer: Medicare PPO | Admitting: Podiatry

## 2020-01-05 ENCOUNTER — Ambulatory Visit (INDEPENDENT_AMBULATORY_CARE_PROVIDER_SITE_OTHER): Payer: Medicare PPO | Admitting: Podiatry

## 2020-01-05 ENCOUNTER — Other Ambulatory Visit: Payer: Self-pay

## 2020-01-05 ENCOUNTER — Encounter: Payer: Self-pay | Admitting: Podiatry

## 2020-01-05 VITALS — Temp 97.3°F

## 2020-01-05 DIAGNOSIS — M79675 Pain in left toe(s): Secondary | ICD-10-CM

## 2020-01-05 DIAGNOSIS — B351 Tinea unguium: Secondary | ICD-10-CM

## 2020-01-05 DIAGNOSIS — Q828 Other specified congenital malformations of skin: Secondary | ICD-10-CM | POA: Diagnosis not present

## 2020-01-05 DIAGNOSIS — M79674 Pain in right toe(s): Secondary | ICD-10-CM | POA: Diagnosis not present

## 2020-01-05 NOTE — Progress Notes (Signed)
Subjective: 69 year old female presents the office they for concerns of elongated, thick toenails that are hard for her to trim. Since changing shoes the callus has not been bad. She does get a callus on the side of her big toe on the left side. Denies any redness or drainage or any swelling to the callus or toenail site.  She has no other concerns.    Objective: AAO x3, NAD DP/PT pulses palpable bilaterally, CRT less than 3 seconds Nails are hypertrophic, dystrophic, brittle, discolored, elongated 10. No surrounding redness or drainage. Tenderness nails 1-5 bilaterally. No open lesions or pre-ulcerative lesions are identified today. Hyperkeratotic tissue is present left foot medial hallux. Very minimal callus formation on the left foot submetatarsal area. There is no underlying ulceration, drainage or any signs of infection. There is no other open lesions or pre-ulcerative lesions identified today.  No pain with calf compression, swelling, warmth, erythema No acute changes otherwsie  Assessment: Left foot porokeratosis; symptomatic onychomycosis   Plan: -All treatment options discussed with the patient including all alternatives, risks, complications.  -Nails sharply debrided x 10 without complications or bleeding.  -Lesion sharply debrided x 1 without complications or bleeding. Offloading pads.  Continue shoe modifications -Daily foot inspection  Celesta Gentile, DPM

## 2020-02-15 DIAGNOSIS — R030 Elevated blood-pressure reading, without diagnosis of hypertension: Secondary | ICD-10-CM | POA: Insufficient documentation

## 2020-04-05 ENCOUNTER — Ambulatory Visit: Payer: Medicare PPO | Admitting: Podiatry

## 2020-04-05 ENCOUNTER — Encounter: Payer: Self-pay | Admitting: Podiatry

## 2020-04-05 ENCOUNTER — Other Ambulatory Visit: Payer: Self-pay

## 2020-04-05 VITALS — Temp 98.0°F

## 2020-04-05 DIAGNOSIS — M79674 Pain in right toe(s): Secondary | ICD-10-CM | POA: Diagnosis not present

## 2020-04-05 DIAGNOSIS — M79675 Pain in left toe(s): Secondary | ICD-10-CM

## 2020-04-05 DIAGNOSIS — B351 Tinea unguium: Secondary | ICD-10-CM | POA: Diagnosis not present

## 2020-04-05 NOTE — Progress Notes (Signed)
Subjective: 69 year old female presents the office they for concerns of elongated, thick toenails that are hard for her to trim. No significant callus and she has changed shoes.  Denies any redness or drainage or any swelling to the callus or toenail site.  She has no other concerns.    Objective: AAO x3, NAD DP/PT pulses palpable bilaterally, CRT less than 3 seconds Nails are hypertrophic, dystrophic, brittle, discolored, elongated 10. No surrounding redness or drainage. Tenderness nails 1-5 bilaterally. No open lesions or pre-ulcerative lesions are identified today. Very minimal hyperkeratotic tissue is present left foot medial hallux and left submetatarsal area.  There is no ongoing ulceration drainage or signs of infection. No pain with calf compression, swelling, warmth, erythema No acute changes otherwsie  Assessment: Symptomatic onychomycosis   Plan: -All treatment options discussed with the patient including all alternatives, risks, complications.  -Nails sharply debrided x 10 without complications or bleeding.  -Continue moisturizer and shoe modifications. -Daily foot inspection  Celesta Gentile, DPM

## 2020-04-09 DIAGNOSIS — T7840XA Allergy, unspecified, initial encounter: Secondary | ICD-10-CM | POA: Insufficient documentation

## 2020-05-17 ENCOUNTER — Telehealth: Payer: Self-pay | Admitting: Family Medicine

## 2020-05-17 NOTE — Telephone Encounter (Signed)
I usually don't continue Klonopin long term, but if she is willing to discuss alternatives, OK to schedule. Oct 1 or beyond please. Ty.

## 2020-05-17 NOTE — Telephone Encounter (Signed)
Caller Kym Scannell Call Back # [phone]  Pt states son sees you as PCP, Patient son is Teofila Bowery.   Please Advise if ok to schedule as a New patient in October 2021 or beyond.

## 2020-05-21 ENCOUNTER — Telehealth: Payer: Self-pay | Admitting: Family Medicine

## 2020-05-21 NOTE — Telephone Encounter (Signed)
I assess amount of usage and if it's more than I like, I adjust medications or recommend counseling until we are stable off of it or use it very infrequently. Ty.

## 2020-05-21 NOTE — Telephone Encounter (Signed)
Caller Shantika Bermea Call Back # [phone]   Patient is scheduled to see Dr Eustace Quail in Oct 2021 and  curious about Dr.Wendling's process of taking her off of clonazapam.

## 2020-06-10 ENCOUNTER — Ambulatory Visit (INDEPENDENT_AMBULATORY_CARE_PROVIDER_SITE_OTHER): Payer: Medicare PPO | Admitting: Family Medicine

## 2020-06-10 ENCOUNTER — Other Ambulatory Visit: Payer: Self-pay

## 2020-06-10 ENCOUNTER — Encounter: Payer: Self-pay | Admitting: Family Medicine

## 2020-06-10 VITALS — BP 118/82 | HR 72 | Temp 98.4°F | Ht 64.0 in | Wt 181.1 lb

## 2020-06-10 DIAGNOSIS — M797 Fibromyalgia: Secondary | ICD-10-CM

## 2020-06-10 DIAGNOSIS — F411 Generalized anxiety disorder: Secondary | ICD-10-CM | POA: Diagnosis not present

## 2020-06-10 DIAGNOSIS — I10 Essential (primary) hypertension: Secondary | ICD-10-CM | POA: Diagnosis not present

## 2020-06-10 MED ORDER — NORTRIPTYLINE HCL 10 MG PO CAPS: 10.0000 mg | ORAL_CAPSULE | Freq: Every day | ORAL | 3 refills | Status: DC

## 2020-06-10 MED ORDER — CLONAZEPAM 0.5 MG PO TABS: 0.2500 mg | ORAL_TABLET | Freq: Two times a day (BID) | ORAL | 2 refills | Status: DC | PRN

## 2020-06-10 NOTE — Patient Instructions (Signed)
Don't use the Klonopin unless you feel you need it.  Aim to do some physical exertion for 150 minutes per week. This is typically divided into 5 days per week, 30 minutes per day. The activity should be enough to get your heart rate up. Anything is better than nothing if you have time constraints.  Please consider counseling. Contact [phone] to schedule an appointment or inquire about cost/insurance coverage.  Coping skills Choose 5 that work for you:  Take a deep breath  Count to 20  Read a book  Do a puzzle  Meditate  Bake  Sing  Knit  Garden  Pray  Go outside  Call a friend  Listen to music  Take a walk  Color  Send a note  Take a bath  Watch a movie  Be alone in a quiet place  Pet an animal  Visit a friend  Journal  Exercise  Stretch   Let us know if you need anything.

## 2020-06-10 NOTE — Progress Notes (Signed)
Chief Complaint  Patient presents with   New Patient (Initial Visit)       New Patient Visit SUBJECTIVE: HPI: Lisa Morrison is an 69 y.o.female who is being seen for establishing care.  The patient was previously seen at [facility].  Hypertension Patient presents for hypertension follow up. She does not routinely monitor home blood pressures. She is compliant with medications- Dyazide 37.5-25 mg/d. Patient has these side effects of medication: none She is usually adhering to a healthy diet overall. Exercise: active around house  SOB w exertion started around 4 yrs ago. This has progressively worsened over time. Went to cardiology where the workup was unremarkable. Sees Dr. Mariana Arn of pulmonology and his workup was unremarkable. She is taking Breo, Singulair, Xyzal. She sees an allergist and is getting allergy shots which her spouse things is helping. This was determined to be related to anxiety. She is on Wellbutrin 150 mg bid and Klonopin.  She has failed Cymbalta, Celexa, and Abilify.  Past Medical History:  Diagnosis Date   Anxiety    Arthritis    Back pain    CFS (chronic fatigue syndrome)    Depression    denies now   Dyspnea    Edema    due to taking lyrica   Family history of adverse reaction to anesthesia    son had n/v   Fibromyalgia    IBS (irritable bowel syndrome)    Knee pain    Leaky heart valve    Migraine    Orthostatic hypotension    from a  previous surgery    Pneumonia    h/o   PONV (postoperative nausea and vomiting)    and heart rate goes down " I fainted after surgery when standing in the restroom."   Seasonal allergies    Spondylolisthesis of lumbar region    Wears glasses    Past Surgical History:  Procedure Laterality Date   ABDOMINAL HYSTERECTOMY     back ablasion     03/2018 and 05/2018   BACK SURGERY  2015   CHOLECYSTECTOMY     HERNIA REPAIR     OOPHORECTOMY     VARICOSE VEIN SURGERY     Family History  Problem  Relation Age of Onset   Mitral valve prolapse Mother    Coronary aneurysm Father    Diabetes Father    Allergies  Allergen Reactions   Augmentin [Amoxicillin-Pot Clavulanate] Anaphylaxis and Rash    Did it involve swelling of the face/tongue/throat, SOB, or low BP? Yes Did it involve sudden or severe rash/hives, skin peeling, or any reaction on the inside of your mouth or nose? Yes Did you need to seek medical attention at a hospital or doctor's office? Yes When did it last happen?Over 10 years ago If all above answers are NO, may proceed with cephalosporin use.   Demerol [Meperidine] Other (See Comments)    Cardiac arrest    Current Outpatient Medications:    acetaminophen (TYLENOL) 650 MG CR tablet, Take 1,300 mg by mouth daily., Disp: , Rfl:    albuterol (VENTOLIN HFA) 108 (90 Base) MCG/ACT inhaler, Inhale 2 puffs into the lungs every 4 (four) hours as needed for wheezing or shortness of breath. , Disp: , Rfl:    AMITIZA 24 MCG capsule, Take 24 mcg by mouth daily. , Disp: , Rfl:    B Complex-C (SUPER B COMPLEX/VITAMIN C) TABS, Take 1 tablet by mouth daily., Disp: , Rfl:    BREO ELLIPTA 200-25 MCG/INH  AEPB, , Disp: , Rfl:    buPROPion (WELLBUTRIN SR) 150 MG 12 hr tablet, Take 150 mg by mouth 2 (two) times daily., Disp: , Rfl:    clonazePAM (KLONOPIN) 0.5 MG tablet, Take 0.5 tablets (0.25 mg total) by mouth 2 (two) times daily as needed for anxiety., Disp: 30 tablet, Rfl: 2   levocetirizine (XYZAL) 5 MG tablet, , Disp: , Rfl:    meloxicam (MOBIC) 7.5 MG tablet, , Disp: , Rfl:    montelukast (SINGULAIR) 10 MG tablet, , Disp: , Rfl:    Multiple Vitamins-Minerals (CENTRUM SILVER 50+WOMEN) TABS, Take 1 tablet by mouth daily., Disp: , Rfl:    Multiple Vitamins-Minerals (HAIR/SKIN/NAILS) CAPS, Take 3 capsules by mouth daily., Disp: , Rfl:    naproxen sodium (ANAPROX) 550 MG tablet, , Disp: , Rfl:    Omega-3 Fatty Acids (ULTRA OMEGA-3 FISH OIL) 1400 MG CAPS, Take 1,400  mg by mouth daily., Disp: , Rfl:    potassium chloride (MICRO-K) 10 MEQ CR capsule, Take 20 mEq by mouth daily. , Disp: , Rfl:    pregabalin (LYRICA) 150 MG capsule, Take 150 mg by mouth at bedtime. , Disp: , Rfl:    topiramate (TOPAMAX) 50 MG tablet, Take 50 mg by mouth at bedtime. , Disp: , Rfl:    triamterene-hydrochlorothiazide (DYAZIDE) 37.5-25 MG capsule, Take 1 capsule by mouth daily., Disp: , Rfl:    nortriptyline (PAMELOR) 10 MG capsule, Take 1 capsule (10 mg total) by mouth at bedtime., Disp: 30 capsule, Rfl: 3  OBJECTIVE: BP 118/82 (BP Location: Left Arm, Patient Position: Sitting, Cuff Size: Normal)    Pulse 72    Temp 98.4 F (36.9 C) (Oral)    Ht 5\' 4"  (1.626 m)    Wt 181 lb 2 oz (82.2 kg)    SpO2 97%    BMI 31.09 kg/m  General:  well developed, well nourished, in no apparent distress Skin:  no significant moles, warts, or growths Lungs:  clear to auscultation, breath sounds equal bilaterally, no respiratory distress Cardio:  regular rate and rhythm, no LE edema or bruits Musculoskeletal:  symmetrical muscle groups noted without atrophy or deformity Neuro:  gait normal Psych: well oriented with normal range of affect and appropriate judgment/insight  ASSESSMENT/PLAN: Essential hypertension  Fibromyalgia - Plan: nortriptyline (PAMELOR) 10 MG capsule  GAD (generalized anxiety disorder) - Plan: nortriptyline (PAMELOR) 10 MG capsule  1.  Continue Dyazide 37.5-25 mg daily.  Counseled on exercise.  Does not need to monitor blood pressures routinely at home. 2.  Continue Lyrica.  Will see how she does by adding nortriptyline to help with anxiety as well. 3.  Add nortriptyline at a low dosage.  Hopefully we can wean her down from Klonopin.  I instructed her to stop taking in the morning when she feels like she is having high levels of anxiety. Patient should return in 6 weeks to recheck 2 and 3. The patient voiced understanding and agreement to the plan.   [place], DO 06/10/20  4:48 PM

## 2020-07-04 ENCOUNTER — Other Ambulatory Visit: Payer: Self-pay | Admitting: Family Medicine

## 2020-07-04 DIAGNOSIS — F411 Generalized anxiety disorder: Secondary | ICD-10-CM

## 2020-07-04 DIAGNOSIS — M797 Fibromyalgia: Secondary | ICD-10-CM

## 2020-07-12 ENCOUNTER — Other Ambulatory Visit: Payer: Self-pay

## 2020-07-12 ENCOUNTER — Ambulatory Visit: Payer: Medicare PPO | Admitting: Podiatry

## 2020-07-12 DIAGNOSIS — B351 Tinea unguium: Secondary | ICD-10-CM | POA: Diagnosis not present

## 2020-07-12 DIAGNOSIS — M79674 Pain in right toe(s): Secondary | ICD-10-CM

## 2020-07-12 DIAGNOSIS — M79675 Pain in left toe(s): Secondary | ICD-10-CM | POA: Diagnosis not present

## 2020-07-12 NOTE — Progress Notes (Signed)
Subjective: 69 year old female presents the office they for concerns of elongated, thick toenails that are hard for her to trim. No significant callus. Oofos have been doing well.  Denies any redness or drainage or any swelling to the callus or toenail site.  She has no other concerns.    Objective: AAO x3, NAD DP/PT pulses palpable bilaterally, CRT less than 3 seconds Nails are hypertrophic, dystrophic, brittle, discolored, elongated 10. No surrounding redness or drainage. Tenderness nails 1-5 bilaterally. No open lesions or pre-ulcerative lesions are identified today. Slight hyperkeratotic tissue is present left foot medial hallux and left submetatarsal area.  There is no ongoing ulceration drainage or signs of infection. No pain with calf compression, swelling, warmth, erythema No acute changes otherwsie  Assessment: Symptomatic onychomycosis   Plan: -All treatment options discussed with the patient including all alternatives, risks, complications.  -Nails sharply debrided x 10 without complications or bleeding.  -Continue moisturizer and shoe modifications. -Daily foot inspection  Celesta Gentile, DPM

## 2020-07-23 ENCOUNTER — Encounter: Payer: Self-pay | Admitting: Family Medicine

## 2020-07-23 ENCOUNTER — Other Ambulatory Visit: Payer: Self-pay

## 2020-07-23 ENCOUNTER — Ambulatory Visit: Payer: Medicare PPO | Admitting: Family Medicine

## 2020-07-23 VITALS — BP 118/68 | HR 94 | Temp 98.2°F | Ht 64.0 in | Wt 183.4 lb

## 2020-07-23 DIAGNOSIS — F419 Anxiety disorder, unspecified: Secondary | ICD-10-CM

## 2020-07-23 DIAGNOSIS — M797 Fibromyalgia: Secondary | ICD-10-CM

## 2020-07-23 MED ORDER — BUSPIRONE HCL 7.5 MG PO TABS: ORAL_TABLET | ORAL | 3 refills | Status: DC

## 2020-07-23 NOTE — Progress Notes (Signed)
Chief Complaint  Patient presents with  . Follow-up    Subjective Lisa Morrison presents for f/u anxiety/depression.  Pt is currently being treated with nortriptyline 10 mg daily and Klonopin 0.25 mg daily as needed.  Reports doing well since treatment. She is tolerating the medicine well and is cut down Klonopin usage to intermittently.  She no longer takes a morning dosage. No thoughts of harming self or others. No self-medication with alcohol, prescription drugs or illicit drugs. Pt is not following with a counselor/psychologist.  Past Medical History:  Diagnosis Date  . Anxiety   . Arthritis   . Back pain   . CFS (chronic fatigue syndrome)   . Depression    denies now  . Dyspnea   . Edema    due to taking lyrica  . Family history of adverse reaction to anesthesia    son had n/v  . Fibromyalgia   . IBS (irritable bowel syndrome)   . Knee pain   . Leaky heart valve   . Migraine   . Orthostatic hypotension    from a  previous surgery   . Pneumonia    h/o  . PONV (postoperative nausea and vomiting)    and heart rate goes down " I fainted after surgery when standing in the restroom."  . Seasonal allergies   . Spondylolisthesis of lumbar region   . Wears glasses    Allergies as of 07/23/2020      Reactions   Augmentin [amoxicillin-pot Clavulanate] Anaphylaxis, Rash   Did it involve swelling of the face/tongue/throat, SOB, or low BP? Yes Did it involve sudden or severe rash/hives, skin peeling, or any reaction on the inside of your mouth or nose? Yes Did you need to seek medical attention at a hospital or doctor's office? Yes When did it last happen?Over 10 years ago If all above answers are "NO", may proceed with cephalosporin use.   Demerol [meperidine] Other (See Comments)   Cardiac arrest      Medication List       Accurate as of July 23, 2020 12:03 PM. If you have any questions, ask your nurse or doctor.        acetaminophen 650 MG CR  tablet Commonly known as: TYLENOL Take 1,300 mg by mouth daily.   Amitiza 24 MCG capsule Generic drug: lubiprostone Take 24 mcg by mouth daily.   Breo Ellipta 200-25 MCG/INH Aepb Generic drug: fluticasone furoate-vilanterol   budesonide 0.5 MG/2ML nebulizer solution Commonly known as: PULMICORT   buPROPion 150 MG 12 hr tablet Commonly known as: WELLBUTRIN SR Take 150 mg by mouth 2 (two) times daily.   busPIRone 7.5 MG tablet Commonly known as: BUSPAR Take 1 tab every 8 hours as needed for anxiety. Started by: Shelda Pal, DO   clonazePAM 0.5 MG tablet Commonly known as: KLONOPIN Take 0.5 tablets (0.25 mg total) by mouth 2 (two) times daily as needed for anxiety.   Fluticasone-Salmeterol 113-14 MCG/ACT Aepb SMARTSIG:1 Inhalation Via Inhaler Twice Daily   Hair/Skin/Nails Caps Take 3 capsules by mouth daily.   Centrum Silver 50+Women Tabs Take 1 tablet by mouth daily.   ivermectin 3 MG Tabs tablet Commonly known as: STROMECTOL   levocetirizine 5 MG tablet Commonly known as: XYZAL   meloxicam 7.5 MG tablet Commonly known as: MOBIC   montelukast 10 MG tablet Commonly known as: SINGULAIR   naproxen sodium 550 MG tablet Commonly known as: ANAPROX   nortriptyline 10 MG capsule Commonly known as: Teacher, music  Take 1 capsule (10 mg total) by mouth at bedtime.   potassium chloride 10 MEQ CR capsule Commonly known as: MICRO-K Take 20 mEq by mouth daily.   pregabalin 150 MG capsule Commonly known as: LYRICA Take 150 mg by mouth at bedtime.   Super B Complex/Vitamin C Tabs Take 1 tablet by mouth daily.   topiramate 50 MG tablet Commonly known as: TOPAMAX Take 50 mg by mouth at bedtime.   triamcinolone 0.1 % Commonly known as: KENALOG   triamterene-hydrochlorothiazide 37.5-25 MG capsule Commonly known as: DYAZIDE Take 1 capsule by mouth daily.   Ultra Omega-3 Fish Oil 1400 MG Caps Take 1,400 mg by mouth daily.   Ventolin HFA 108 (90 Base) MCG/ACT  inhaler Generic drug: albuterol Inhale 2 puffs into the lungs every 4 (four) hours as needed for wheezing or shortness of breath.       Exam BP 118/68 (BP Location: Left Arm, Patient Position: Sitting, Cuff Size: Normal)   Pulse 94   Temp 98.2 F (36.8 C) (Oral)   Ht 5\' 4"  (1.626 m)   Wt 183 lb 6 oz (83.2 kg)   SpO2 99%   BMI 31.48 kg/m  General:  well developed, well nourished, in no apparent distress Lungs:  No respiratory distress Psych: well oriented with normal range of affect and age-appropriate judgement/insight, alert and oriented x4.  Assessment and Plan  Anxiety - Plan: busPIRone (BUSPAR) 7.5 MG tablet  Fibromyalgia  BuSpar 7.5 mg 3 times daily as needed to replace Klonopin.  Continue nortriptyline 10 mg daily.  She is not interested in seeing a counselor at this time.  Counseled on exercise. Her fibromyalgia is well controlled with Lyrica. She does have a history of dyspnea on exertion, she has an injection for her back.  The pain may be contributing to her dyspnea on exertion so she does well with that and still has shortness of breath, she will let us know and we will refer to our pulmonology team for second opinion. F/u in 6 weeks. The patient voiced understanding and agreement to the plan.  Greater than 30 minutes were spent face to face with the patient, counseling on our plan and alternatives to Klonopin, reviewing her chart.    Vesper, DO 07/23/20 12:03 PM

## 2020-07-23 NOTE — Patient Instructions (Addendum)
Let's stay on the Pamelor.  Use the BuSpar instead of the Klonopin.   Keep me in the loop regarding your injection and breathing.   Let us know if you need anything.

## 2020-07-28 ENCOUNTER — Other Ambulatory Visit: Payer: Self-pay | Admitting: Family Medicine

## 2020-07-29 ENCOUNTER — Telehealth: Payer: Self-pay | Admitting: Family Medicine

## 2020-07-29 MED ORDER — CLONAZEPAM 0.5 MG PO TABS: 0.2500 mg | ORAL_TABLET | Freq: Two times a day (BID) | ORAL | 2 refills | Status: DC | PRN

## 2020-07-29 NOTE — Telephone Encounter (Signed)
Patient states she would like a call back in reference to buPROPion (WELLBUTRIN SR) 150 MG 12 hr tablet Patient states medication is making her dizziness and patient would like to re fill the  clonazePAM (KLONOPIN) 0.5 MG tablet [[id number] Patient states she did not get it when it was first sent in to pharmacy

## 2020-07-29 NOTE — Telephone Encounter (Signed)
Advise on request I denied refill on clonazepam last RF--06/10/2020  #[phone] with 2 refills

## 2020-09-02 ENCOUNTER — Telehealth: Payer: Self-pay | Admitting: Family Medicine

## 2020-09-02 NOTE — Telephone Encounter (Signed)
Needs an appt- Lisa Morrison out until next week.

## 2020-09-02 NOTE — Telephone Encounter (Signed)
Patient states she would to speak with Lisa Morrison Dr Hollie Beach CMA when she returns to the office. Patient is experiencing  flu symptoms.

## 2020-09-03 ENCOUNTER — Ambulatory Visit: Payer: Medicare PPO | Admitting: Family Medicine

## 2020-09-05 ENCOUNTER — Telehealth: Payer: Self-pay | Admitting: Family Medicine

## 2020-09-05 NOTE — Telephone Encounter (Signed)
Patient states she has Conjunctivitis and she is leaving out of town today patient  would like a prescription before the day is up

## 2020-09-05 NOTE — Telephone Encounter (Signed)
Called patient and advised that she would have to be evaluated, could not give apt in office since most providers are booked. Suggested her going to a urgent care to get evaluated.

## 2020-09-07 HISTORY — PX: OTHER SURGICAL HISTORY: SHX169

## 2020-09-09 ENCOUNTER — Telehealth (INDEPENDENT_AMBULATORY_CARE_PROVIDER_SITE_OTHER): Payer: Medicare PPO | Admitting: Family Medicine

## 2020-09-09 ENCOUNTER — Encounter: Payer: Self-pay | Admitting: Family Medicine

## 2020-09-09 ENCOUNTER — Other Ambulatory Visit: Payer: Self-pay

## 2020-09-09 DIAGNOSIS — J029 Acute pharyngitis, unspecified: Secondary | ICD-10-CM

## 2020-09-09 MED ORDER — PREDNISONE 20 MG PO TABS: 40.0000 mg | ORAL_TABLET | Freq: Every day | ORAL | 0 refills | Status: AC

## 2020-09-09 NOTE — Progress Notes (Signed)
Chief Complaint  Patient presents with  . Sore Throat    Pt states having sore throat for x1 week. Pt states having trouble swallowing. Pt states having dry cough. Pt states negative COVID test. Pt states having fatigue and no fever.     Cena Benton here for URI complaints. Due to COVID-19 pandemic, we are interacting via web portal for an electronic face-to-face visit. I verified patient's ID using 2 identifiers. Patient agreed to proceed with visit via this method. Patient is at home, I am at office. Patient and I are present for visit.    Duration: several weeks, just before Xmas  Associated symptoms: rhinorrhea, ear fullness, sore throat and dry cough Denies: sinus congestion, sinus pain, itchy watery eyes, ear pain, wheezing, shortness of breath, myalgia and fevers Treatment to date: Sudafed, Mucinex, Day/Nyquil Sick contacts: Yes- daughter who tested neg for strep  Past Medical History:  Diagnosis Date  . Anxiety   . Arthritis   . Back pain   . CFS (chronic fatigue syndrome)   . Depression    denies now  . Dyspnea   . Edema    due to taking lyrica  . Family history of adverse reaction to anesthesia    son had n/v  . Fibromyalgia   . IBS (irritable bowel syndrome)   . Knee pain   . Leaky heart valve   . Migraine   . Orthostatic hypotension    from a  previous surgery   . Pneumonia    h/o  . PONV (postoperative nausea and vomiting)    and heart rate goes down " I fainted after surgery when standing in the restroom."  . Seasonal allergies   . Spondylolisthesis of lumbar region   . Wears glasses    Exam No conversational dyspnea Age appropriate judgment and insight Nml affect and mood  Sore throat - Plan: predniSONE (DELTASONE) 20 MG tablet  5 d pred burst, 40 mg/d. Zpak given pcn allergy in 2 d if no better, pt will message. I see her next week so will be able to get better exam if no improvement. Considering allergic cause a this point given duration.  Consider  throat lozenges, salt water gargles and an air humidifier for symptomatic care.  F/u prn. If starting to experience fevers, shaking, or shortness of breath, seek immediate care. Pt voiced understanding and agreement to the plan.  Jilda Roche Eldridge, DO 09/09/20 2:51 PM

## 2020-09-11 ENCOUNTER — Other Ambulatory Visit: Payer: Self-pay | Admitting: Family Medicine

## 2020-09-11 MED ORDER — AZITHROMYCIN 250 MG PO TABS: ORAL_TABLET | ORAL | 0 refills | Status: DC

## 2020-09-17 ENCOUNTER — Other Ambulatory Visit: Payer: Self-pay

## 2020-09-18 ENCOUNTER — Encounter: Payer: Self-pay | Admitting: Family Medicine

## 2020-09-18 ENCOUNTER — Telehealth: Payer: Self-pay | Admitting: Family Medicine

## 2020-09-18 ENCOUNTER — Other Ambulatory Visit: Payer: Self-pay | Admitting: Family Medicine

## 2020-09-18 ENCOUNTER — Other Ambulatory Visit: Payer: Self-pay

## 2020-09-18 ENCOUNTER — Ambulatory Visit: Payer: Medicare PPO | Admitting: Family Medicine

## 2020-09-18 ENCOUNTER — Ambulatory Visit (HOSPITAL_BASED_OUTPATIENT_CLINIC_OR_DEPARTMENT_OTHER)
Admission: RE | Admit: 2020-09-18 | Discharge: 2020-09-18 | Disposition: A | Discharge status: Home / Self Care | Payer: Medicare PPO | Source: Ambulatory Visit | Attending: Family Medicine | Admitting: Family Medicine

## 2020-09-18 ENCOUNTER — Other Ambulatory Visit (INDEPENDENT_AMBULATORY_CARE_PROVIDER_SITE_OTHER): Payer: Medicare PPO

## 2020-09-18 VITALS — BP 120/80 | HR 98 | Temp 97.9°F | Ht 64.0 in | Wt 174.0 lb

## 2020-09-18 DIAGNOSIS — R0600 Dyspnea, unspecified: Secondary | ICD-10-CM | POA: Diagnosis not present

## 2020-09-18 DIAGNOSIS — R059 Cough, unspecified: Secondary | ICD-10-CM | POA: Insufficient documentation

## 2020-09-18 DIAGNOSIS — R631 Polydipsia: Secondary | ICD-10-CM

## 2020-09-18 DIAGNOSIS — E876 Hypokalemia: Secondary | ICD-10-CM

## 2020-09-18 DIAGNOSIS — F411 Generalized anxiety disorder: Secondary | ICD-10-CM

## 2020-09-18 DIAGNOSIS — M797 Fibromyalgia: Secondary | ICD-10-CM

## 2020-09-18 LAB — COMPREHENSIVE METABOLIC PANEL
ALT: 20 U/L (ref 0–35)
AST: 15 U/L (ref 0–37)
Albumin: 4.2 g/dL (ref 3.5–5.2)
Alkaline Phosphatase: 96 U/L (ref 39–117)
BUN: 19 mg/dL (ref 6–23)
CO2: 31 mEq/L (ref 19–32)
Calcium: 9.5 mg/dL (ref 8.4–10.5)
Chloride: 99 mEq/L (ref 96–112)
Creatinine, Ser: 0.75 mg/dL (ref 0.40–1.20)
GFR: 81.94 mL/min (ref >60.00)
Glucose, Bld: 93 mg/dL (ref 70–99)
Potassium: 3.2 mEq/L — ABNORMAL LOW (ref 3.5–5.1)
Sodium: 138 mEq/L (ref 135–145)
Total Bilirubin: 0.6 mg/dL (ref 0.2–1.2)
Total Protein: 7 g/dL (ref 6.0–8.3)

## 2020-09-18 LAB — HEMOGLOBIN A1C: Hgb A1c MFr Bld: 5.8 % (ref 4.6–6.5)

## 2020-09-18 LAB — MAGNESIUM: Magnesium: 2.2 mg/dL (ref 1.5–2.5)

## 2020-09-18 MED ORDER — FLOVENT HFA 110 MCG/ACT IN AERO: 2.0000 | INHALATION_SPRAY | Freq: Two times a day (BID) | RESPIRATORY_TRACT | 1 refills | Status: DC

## 2020-09-18 MED ORDER — POTASSIUM CHLORIDE ER 10 MEQ PO CPCR: 20.0000 meq | ORAL_CAPSULE | Freq: Every day | ORAL | 1 refills | Status: DC

## 2020-09-18 MED ORDER — BENZONATATE 100 MG PO CAPS
100.0000 mg | ORAL_CAPSULE | Freq: Three times a day (TID) | ORAL | 0 refills | Status: DC | PRN
Start: 2020-09-18 — End: 2020-10-16

## 2020-09-18 MED ORDER — NORTRIPTYLINE HCL 10 MG PO CAPS: 10.0000 mg | ORAL_CAPSULE | Freq: Every day | ORAL | 2 refills | Status: DC

## 2020-09-18 NOTE — Telephone Encounter (Signed)
Sent in potassium and patient informed

## 2020-09-18 NOTE — Patient Instructions (Addendum)
We will be in touch regarding your X-ray and labs.  Keep the diet clean and stay active.  Rinse your mouth out after using the Flovent.   Let us know if you need anything.

## 2020-09-18 NOTE — Telephone Encounter (Signed)
The patient went a few days without taking her potassium as was out. She would like a refill and did think they may have been why her potassium off?? Ok to refill

## 2020-09-18 NOTE — Telephone Encounter (Signed)
Makes sense and OK to refill. Ty.

## 2020-09-18 NOTE — Progress Notes (Signed)
Chief Complaint  Patient presents with  . Follow-up    Subjective Lisa Morrison presents for f/u anxiety/depression.  Pt is currently being treated with Wellbutrin 150 mg SR bid, Pamelor 10 mg qhs, Klonopin 0.25 mg prn.  Reports doing well since treatment. Could not tolerate the BuSpar, but only used Klonopin 2x over past 6 weeks and they were holiday related.  No thoughts of harming self or others. No self-medication with alcohol, prescription drugs or illicit drugs. Pt is not following with a counselor/psychologist.  Patient has a history of asthma for which she follows with allergy team for.  She has been having a dry cough and dyspnea on exertion over the past few weeks.  She did have improvement after taking Z-Pak with a prednisone burst.  She does not have any fevers or other upper respiratory symptoms at this time.  Past Medical History:  Diagnosis Date  . Anxiety   . Arthritis   . Back pain   . CFS (chronic fatigue syndrome)   . Depression    denies now  . Dyspnea   . Edema    due to taking lyrica  . Family history of adverse reaction to anesthesia    son had n/v  . Fibromyalgia   . IBS (irritable bowel syndrome)   . Knee pain   . Leaky heart valve   . Migraine   . Orthostatic hypotension    from a  previous surgery   . Pneumonia    h/o  . PONV (postoperative nausea and vomiting)    and heart rate goes down " I fainted after surgery when standing in the restroom."  . Seasonal allergies   . Spondylolisthesis of lumbar region   . Wears glasses    Allergies as of 09/18/2020      Reactions   Augmentin [amoxicillin-pot Clavulanate] Anaphylaxis, Rash   Did it involve swelling of the face/tongue/throat, SOB, or low BP? Yes Did it involve sudden or severe rash/hives, skin peeling, or any reaction on the inside of your mouth or nose? Yes Did you need to seek medical attention at a hospital or doctor's office? Yes When did it last happen?Over 10 years ago If all above  answers are "NO", may proceed with cephalosporin use.   Demerol [meperidine] Other (See Comments)   Cardiac arrest      Medication List       Accurate as of September 18, 2020  8:58 AM. If you have any questions, ask your nurse or doctor.        STOP taking these medications   azithromycin 250 MG tablet Commonly known as: ZITHROMAX Stopped by: Shelda Pal, DO   Breo Ellipta 200-25 MCG/INH Aepb Generic drug: fluticasone furoate-vilanterol Stopped by: Crosby Oyster Sylvester Minton, DO   budesonide 0.5 MG/2ML nebulizer solution Commonly known as: PULMICORT Stopped by: Shelda Pal, DO   busPIRone 7.5 MG tablet Commonly known as: BUSPAR Stopped by: Shelda Pal, DO   ivermectin 3 MG Tabs tablet Commonly known as: STROMECTOL Stopped by: Shelda Pal, DO   naproxen sodium 550 MG tablet Commonly known as: ANAPROX Stopped by: Shelda Pal, DO   triamcinolone 0.1 % Commonly known as: KENALOG Stopped by: Shelda Pal, DO     TAKE these medications   acetaminophen 650 MG CR tablet Commonly known as: TYLENOL Take 1,300 mg by mouth daily.   albuterol 108 (90 Base) MCG/ACT inhaler Commonly known as: VENTOLIN HFA Inhale 2 puffs into the lungs  every 4 (four) hours as needed for wheezing or shortness of breath.   Amitiza 24 MCG capsule Generic drug: lubiprostone Take 24 mcg by mouth daily.   benzonatate 100 MG capsule Commonly known as: TESSALON Take 1 capsule (100 mg total) by mouth 3 (three) times daily as needed. Started by: Shelda Pal, DO   buPROPion 150 MG 12 hr tablet Commonly known as: WELLBUTRIN SR Take 150 mg by mouth 2 (two) times daily.   Centrum Silver 50+Women Tabs Take 1 tablet by mouth daily. What changed: Another medication with the same name was removed. Continue taking this medication, and follow the directions you see here. Changed by: Shelda Pal, DO   clonazePAM 0.5 MG  tablet Commonly known as: KLONOPIN Take 0.5 tablets (0.25 mg total) by mouth 2 (two) times daily as needed for anxiety.   Flovent HFA 110 MCG/ACT inhaler Generic drug: fluticasone Inhale 2 puffs into the lungs in the morning and at bedtime. Started by: Shelda Pal, DO   Fluticasone-Salmeterol [phone] MCG/ACT Aepb SMARTSIG:1 Inhalation Via Inhaler Twice Daily   levocetirizine 5 MG tablet Commonly known as: XYZAL   meloxicam 7.5 MG tablet Commonly known as: MOBIC   montelukast 10 MG tablet Commonly known as: SINGULAIR   nortriptyline 10 MG capsule Commonly known as: Pamelor Take 1 capsule (10 mg total) by mouth at bedtime.   potassium chloride 10 MEQ CR capsule Commonly known as: MICRO-K Take 20 mEq by mouth daily.   pregabalin 150 MG capsule Commonly known as: LYRICA Take 150 mg by mouth at bedtime.   Super B Complex/Vitamin C Tabs Take 1 tablet by mouth daily.   topiramate 50 MG tablet Commonly known as: TOPAMAX Take 50 mg by mouth at bedtime.   triamterene-hydrochlorothiazide 37.5-25 MG capsule Commonly known as: DYAZIDE Take 1 capsule by mouth daily.   Ultra Omega-3 Fish Oil 1400 MG Caps Take 1,400 mg by mouth daily.       Exam BP 120/80 (BP Location: Left Arm, Patient Position: Sitting, Cuff Size: Normal)   Pulse 98   Temp 97.9 F (36.6 C) (Oral)   Ht 5\' 4"  (1.626 m)   Wt 174 lb (78.9 kg)   SpO2 95%   BMI 29.87 kg/m  General:  well developed, well nourished, in no apparent distress Lungs:  No respiratory distress Psych: well oriented with normal range of affect and age-appropriate judgement/insight, alert and oriented x4.  Assessment and Plan  GAD (generalized anxiety disorder) - Plan: nortriptyline (PAMELOR) 10 MG capsule  Cough - Plan: fluticasone (FLOVENT HFA) 110 MCG/ACT inhaler, benzonatate (TESSALON) 100 MG capsule, DG Chest 2 View  Dyspnea, unspecified type - Plan: fluticasone (FLOVENT HFA) 110 MCG/ACT inhaler, DG Chest 2  View  Fibromyalgia - Plan: nortriptyline (PAMELOR) 10 MG capsule  Increased thirst - Plan: Hemoglobin A1c, Comprehensive metabolic panel  1. Cont Nortriptyline 10 mg qhs, Klonopin prn, Wellbutrin 150 mg SR bid.  Goal is sparse use of Klonopin <10x/mo as she has been doing. 2/3. ICS, Tessalon Perles, CXR. 4. Cont TCA. 5. Ck labs. R/o electrolyte issue vs DM.  She did ask my opinion about supplements for herself.  We will stop the hair/nail supplement and see how she does.  I would stop other supplements as well but want to do 1 thing at a time because if she feels different, we want to know what the single factors. She also asked my opinion on ivermectin for COVID treatment.  I told her I do not recommend  this should she become ill with COVID.  It would be indicated in certain parasitic infections. F/u in 6 mo for CPE. The patient voiced understanding and agreement to the plan.  Greater than 40 minutes were spent with the patient discussing the above plan in addition to reviewing their chart information on the same day of the visit.   [place], DO 09/18/20 8:58 AM

## 2020-09-25 ENCOUNTER — Other Ambulatory Visit (INDEPENDENT_AMBULATORY_CARE_PROVIDER_SITE_OTHER): Payer: Medicare PPO

## 2020-09-25 ENCOUNTER — Other Ambulatory Visit: Payer: Self-pay

## 2020-09-25 DIAGNOSIS — E876 Hypokalemia: Secondary | ICD-10-CM | POA: Diagnosis not present

## 2020-09-25 LAB — BASIC METABOLIC PANEL
BUN: 19 mg/dL (ref 6–23)
CO2: 27 mEq/L (ref 19–32)
Calcium: 9.4 mg/dL (ref 8.4–10.5)
Chloride: 104 mEq/L (ref 96–112)
Creatinine, Ser: 0.88 mg/dL (ref 0.40–1.20)
GFR: 67.63 mL/min (ref >60.00)
Glucose, Bld: 89 mg/dL (ref 70–99)
Potassium: 3.7 mEq/L (ref 3.5–5.1)
Sodium: 138 mEq/L (ref 135–145)

## 2020-09-27 ENCOUNTER — Telehealth: Payer: Self-pay | Admitting: Family Medicine

## 2020-09-27 DIAGNOSIS — Z1231 Encounter for screening mammogram for malignant neoplasm of breast: Secondary | ICD-10-CM

## 2020-09-27 DIAGNOSIS — Z1239 Encounter for other screening for malignant neoplasm of breast: Secondary | ICD-10-CM

## 2020-09-27 NOTE — Telephone Encounter (Signed)
ordered

## 2020-09-27 NOTE — Telephone Encounter (Signed)
That's fine, might need to be changed to [facility] imaging. Ty.

## 2020-09-27 NOTE — Telephone Encounter (Signed)
Would like a referral to get a 3D mammogram downstairs at the [facility]. She uses to go to [facility] but wants to change all her care to [facility].

## 2020-10-01 ENCOUNTER — Other Ambulatory Visit: Payer: Self-pay | Admitting: Family Medicine

## 2020-10-01 ENCOUNTER — Encounter: Payer: Self-pay | Admitting: Family Medicine

## 2020-10-01 ENCOUNTER — Telehealth (INDEPENDENT_AMBULATORY_CARE_PROVIDER_SITE_OTHER): Payer: Medicare PPO | Admitting: Family Medicine

## 2020-10-01 ENCOUNTER — Other Ambulatory Visit: Payer: Self-pay

## 2020-10-01 DIAGNOSIS — J069 Acute upper respiratory infection, unspecified: Secondary | ICD-10-CM | POA: Diagnosis not present

## 2020-10-01 MED ORDER — PREDNISONE 20 MG PO TABS: 40.0000 mg | ORAL_TABLET | Freq: Every day | ORAL | 0 refills | Status: AC

## 2020-10-01 MED ORDER — PROMETHAZINE-CODEINE 6.25-10 MG/5ML PO SYRP: 5.0000 mL | ORAL_SOLUTION | Freq: Three times a day (TID) | ORAL | 0 refills | Status: DC | PRN

## 2020-10-01 NOTE — Progress Notes (Signed)
Chief Complaint  Patient presents with  . Cough    Congestion Fatigue     Hedy Camara here for URI complaints. Due to COVID-19 pandemic, we are interacting via telephone. I verified patient's ID using 2 identifiers. Patient agreed to proceed with visit via this method. Patient is at home, I am at office. Patient and I are present for visit.   Duration: 3 days  Associated symptoms: sinus congestion, sinus pain, rhinorrhea, wheezing and cough, dental pain Denies: itchy watery eyes, ear pain, ear drainage, sore throat, shortness of breath, myalgia, fevers, and N/V/D Treatment to date: Advil Sick contacts: Yes - spouse tested + for covid in addition to DIL Awaiting covid test results.  Did receive J&J vaccine, had J&J booster 2 mo after.   Past Medical History:  Diagnosis Date  . Anxiety   . Arthritis   . Back pain   . CFS (chronic fatigue syndrome)   . Depression    denies now  . Dyspnea   . Edema    due to taking lyrica  . Family history of adverse reaction to anesthesia    son had n/v  . Fibromyalgia   . IBS (irritable bowel syndrome)   . Knee pain   . Leaky heart valve   . Migraine   . Orthostatic hypotension    from a  previous surgery   . Pneumonia    h/o  . PONV (postoperative nausea and vomiting)    and heart rate goes down " I fainted after surgery when standing in the restroom."  . Seasonal allergies   . Spondylolisthesis of lumbar region   . Wears glasses    Objective No conversational dyspnea Age appropriate judgment and insight Nml affect and mood  Viral URI with cough  Likely has covid. 5 d pred burst 40 mg/d. Cough syrup. Warned about drowsiness and possible mental changes, has been on before.  Continue to push fluids, practice good hand hygiene, cover mouth when coughing. F/u prn. If starting to experience irreplaceable fluid loss, shaking, or shortness of breath, seek immediate care. Total time: 14 min Pt voiced understanding and agreement to the  plan.  [place], DO 10/01/20 10:42 AM

## 2020-10-01 NOTE — Telephone Encounter (Signed)
Requesting: Lyrica 150mg   Contract: UDS: Last Visit: 09/18/20  Next Visit: 10/01/20 Last Refill: I don't see where you have ever prescribed this?   Please Advise

## 2020-10-02 NOTE — Addendum Note (Signed)
Addended by: Ames Coupe on: 10/02/2020 12:00 PM   Modules accepted: Level of Service

## 2020-10-16 ENCOUNTER — Other Ambulatory Visit: Payer: Self-pay

## 2020-10-16 DIAGNOSIS — R059 Cough, unspecified: Secondary | ICD-10-CM

## 2020-10-16 MED ORDER — BENZONATATE 100 MG PO CAPS: 100.0000 mg | ORAL_CAPSULE | Freq: Three times a day (TID) | ORAL | 0 refills | Status: DC | PRN

## 2020-10-18 ENCOUNTER — Ambulatory Visit: Payer: Medicare PPO | Admitting: Podiatry

## 2020-10-18 ENCOUNTER — Other Ambulatory Visit: Payer: Self-pay

## 2020-10-18 DIAGNOSIS — M79674 Pain in right toe(s): Secondary | ICD-10-CM | POA: Diagnosis not present

## 2020-10-18 DIAGNOSIS — M79675 Pain in left toe(s): Secondary | ICD-10-CM

## 2020-10-18 DIAGNOSIS — B351 Tinea unguium: Secondary | ICD-10-CM | POA: Diagnosis not present

## 2020-10-18 DIAGNOSIS — Q828 Other specified congenital malformations of skin: Secondary | ICD-10-CM

## 2020-10-18 NOTE — Progress Notes (Signed)
Subjective: 70 year old female presents the office they for concerns of elongated, thick toenails that are hard for her to trim and for a callus on the left foot. No open lesions, swelling or other issues she reports. Denies any redness or drainage or any swelling to the callus or toenail site.  She has no other concerns.    Tested + for COVID last mont- feeling better  Objective: AAO x3, NAD DP/PT pulses palpable bilaterally, CRT less than 3 seconds Nails are hypertrophic, dystrophic, brittle, discolored, elongated 10. No surrounding redness or drainage. Tenderness nails 1-5 bilaterally. No open lesions or pre-ulcerative lesions are identified today. Mild hyperkeratotic tissue is present left foot medial hallux and left submetatarsal area.  There is no ongoing ulceration drainage or signs of infection. No pain with calf compression, swelling, warmth, erythema No acute changes otherwsie  Assessment: Symptomatic onychomycosis   Plan: -All treatment options discussed with the patient including all alternatives, risks, complications.  -Nails sharply debrided x 10 without complications or bleeding.  -Hyperkeratotic lesions sharply debrided x 2 without any complications or bleeding. -Continue moisturizer and supportive shoes -Daily foot inspection  Celesta Gentile, DPM

## 2020-11-06 DIAGNOSIS — J301 Allergic rhinitis due to pollen: Secondary | ICD-10-CM | POA: Diagnosis not present

## 2020-11-06 DIAGNOSIS — J3081 Allergic rhinitis due to animal (cat) (dog) hair and dander: Secondary | ICD-10-CM | POA: Diagnosis not present

## 2020-11-06 DIAGNOSIS — J3089 Other allergic rhinitis: Secondary | ICD-10-CM | POA: Diagnosis not present

## 2020-11-10 ENCOUNTER — Other Ambulatory Visit: Payer: Self-pay | Admitting: Family Medicine

## 2020-11-10 DIAGNOSIS — R0600 Dyspnea, unspecified: Secondary | ICD-10-CM

## 2020-11-10 DIAGNOSIS — R059 Cough, unspecified: Secondary | ICD-10-CM

## 2020-11-13 DIAGNOSIS — J3089 Other allergic rhinitis: Secondary | ICD-10-CM | POA: Diagnosis not present

## 2020-11-13 DIAGNOSIS — J3081 Allergic rhinitis due to animal (cat) (dog) hair and dander: Secondary | ICD-10-CM | POA: Diagnosis not present

## 2020-11-13 DIAGNOSIS — J301 Allergic rhinitis due to pollen: Secondary | ICD-10-CM | POA: Diagnosis not present

## 2020-11-14 DIAGNOSIS — J3089 Other allergic rhinitis: Secondary | ICD-10-CM | POA: Diagnosis not present

## 2020-11-14 DIAGNOSIS — J301 Allergic rhinitis due to pollen: Secondary | ICD-10-CM | POA: Diagnosis not present

## 2020-11-14 DIAGNOSIS — J4540 Moderate persistent asthma, uncomplicated: Secondary | ICD-10-CM | POA: Diagnosis not present

## 2020-11-14 DIAGNOSIS — J3081 Allergic rhinitis due to animal (cat) (dog) hair and dander: Secondary | ICD-10-CM | POA: Diagnosis not present

## 2020-11-14 DIAGNOSIS — R0609 Other forms of dyspnea: Secondary | ICD-10-CM | POA: Diagnosis not present

## 2020-11-18 ENCOUNTER — Telehealth: Payer: Self-pay

## 2020-11-18 DIAGNOSIS — C44729 Squamous cell carcinoma of skin of left lower limb, including hip: Secondary | ICD-10-CM | POA: Diagnosis not present

## 2020-11-18 DIAGNOSIS — L905 Scar conditions and fibrosis of skin: Secondary | ICD-10-CM | POA: Diagnosis not present

## 2020-11-18 NOTE — Telephone Encounter (Signed)
Rec'd from [place] forwarded 3 pages to [place]

## 2020-11-19 DIAGNOSIS — J4540 Moderate persistent asthma, uncomplicated: Secondary | ICD-10-CM | POA: Diagnosis not present

## 2020-11-19 DIAGNOSIS — R0609 Other forms of dyspnea: Secondary | ICD-10-CM | POA: Diagnosis not present

## 2020-11-19 DIAGNOSIS — J3089 Other allergic rhinitis: Secondary | ICD-10-CM | POA: Diagnosis not present

## 2020-11-19 DIAGNOSIS — J3081 Allergic rhinitis due to animal (cat) (dog) hair and dander: Secondary | ICD-10-CM | POA: Diagnosis not present

## 2020-11-19 DIAGNOSIS — J301 Allergic rhinitis due to pollen: Secondary | ICD-10-CM | POA: Diagnosis not present

## 2020-11-20 DIAGNOSIS — J3089 Other allergic rhinitis: Secondary | ICD-10-CM | POA: Diagnosis not present

## 2020-11-20 DIAGNOSIS — J301 Allergic rhinitis due to pollen: Secondary | ICD-10-CM | POA: Diagnosis not present

## 2020-11-20 DIAGNOSIS — J3081 Allergic rhinitis due to animal (cat) (dog) hair and dander: Secondary | ICD-10-CM | POA: Diagnosis not present

## 2020-11-25 DIAGNOSIS — H25813 Combined forms of age-related cataract, bilateral: Secondary | ICD-10-CM | POA: Diagnosis not present

## 2020-11-27 DIAGNOSIS — J3089 Other allergic rhinitis: Secondary | ICD-10-CM | POA: Diagnosis not present

## 2020-11-27 DIAGNOSIS — J301 Allergic rhinitis due to pollen: Secondary | ICD-10-CM | POA: Diagnosis not present

## 2020-11-27 DIAGNOSIS — J3081 Allergic rhinitis due to animal (cat) (dog) hair and dander: Secondary | ICD-10-CM | POA: Diagnosis not present

## 2020-12-04 DIAGNOSIS — J3081 Allergic rhinitis due to animal (cat) (dog) hair and dander: Secondary | ICD-10-CM | POA: Diagnosis not present

## 2020-12-04 DIAGNOSIS — J301 Allergic rhinitis due to pollen: Secondary | ICD-10-CM | POA: Diagnosis not present

## 2020-12-04 DIAGNOSIS — J3089 Other allergic rhinitis: Secondary | ICD-10-CM | POA: Diagnosis not present

## 2020-12-11 DIAGNOSIS — J3089 Other allergic rhinitis: Secondary | ICD-10-CM | POA: Diagnosis not present

## 2020-12-11 DIAGNOSIS — J301 Allergic rhinitis due to pollen: Secondary | ICD-10-CM | POA: Diagnosis not present

## 2020-12-11 DIAGNOSIS — J3081 Allergic rhinitis due to animal (cat) (dog) hair and dander: Secondary | ICD-10-CM | POA: Diagnosis not present

## 2020-12-18 DIAGNOSIS — J3089 Other allergic rhinitis: Secondary | ICD-10-CM | POA: Diagnosis not present

## 2020-12-18 DIAGNOSIS — J3081 Allergic rhinitis due to animal (cat) (dog) hair and dander: Secondary | ICD-10-CM | POA: Diagnosis not present

## 2020-12-18 DIAGNOSIS — J301 Allergic rhinitis due to pollen: Secondary | ICD-10-CM | POA: Diagnosis not present

## 2020-12-19 DIAGNOSIS — Z6829 Body mass index (BMI) 29.0-29.9, adult: Secondary | ICD-10-CM | POA: Diagnosis not present

## 2020-12-19 DIAGNOSIS — J4540 Moderate persistent asthma, uncomplicated: Secondary | ICD-10-CM | POA: Diagnosis not present

## 2020-12-19 DIAGNOSIS — R0609 Other forms of dyspnea: Secondary | ICD-10-CM | POA: Diagnosis not present

## 2020-12-19 DIAGNOSIS — J3081 Allergic rhinitis due to animal (cat) (dog) hair and dander: Secondary | ICD-10-CM | POA: Diagnosis not present

## 2020-12-19 DIAGNOSIS — J301 Allergic rhinitis due to pollen: Secondary | ICD-10-CM | POA: Diagnosis not present

## 2020-12-19 DIAGNOSIS — M5136 Other intervertebral disc degeneration, lumbar region: Secondary | ICD-10-CM | POA: Diagnosis not present

## 2020-12-19 DIAGNOSIS — Z981 Arthrodesis status: Secondary | ICD-10-CM | POA: Diagnosis not present

## 2020-12-19 DIAGNOSIS — J3089 Other allergic rhinitis: Secondary | ICD-10-CM | POA: Diagnosis not present

## 2020-12-24 DIAGNOSIS — J3081 Allergic rhinitis due to animal (cat) (dog) hair and dander: Secondary | ICD-10-CM | POA: Diagnosis not present

## 2020-12-24 DIAGNOSIS — J301 Allergic rhinitis due to pollen: Secondary | ICD-10-CM | POA: Diagnosis not present

## 2020-12-24 DIAGNOSIS — J3089 Other allergic rhinitis: Secondary | ICD-10-CM | POA: Diagnosis not present

## 2020-12-25 DIAGNOSIS — J301 Allergic rhinitis due to pollen: Secondary | ICD-10-CM | POA: Diagnosis not present

## 2020-12-25 DIAGNOSIS — J3081 Allergic rhinitis due to animal (cat) (dog) hair and dander: Secondary | ICD-10-CM | POA: Diagnosis not present

## 2020-12-25 DIAGNOSIS — J3089 Other allergic rhinitis: Secondary | ICD-10-CM | POA: Diagnosis not present

## 2020-12-30 ENCOUNTER — Other Ambulatory Visit: Payer: Self-pay | Admitting: Family Medicine

## 2020-12-30 DIAGNOSIS — M47816 Spondylosis without myelopathy or radiculopathy, lumbar region: Secondary | ICD-10-CM | POA: Diagnosis not present

## 2021-01-01 DIAGNOSIS — J3081 Allergic rhinitis due to animal (cat) (dog) hair and dander: Secondary | ICD-10-CM | POA: Diagnosis not present

## 2021-01-01 DIAGNOSIS — J301 Allergic rhinitis due to pollen: Secondary | ICD-10-CM | POA: Diagnosis not present

## 2021-01-01 DIAGNOSIS — J3089 Other allergic rhinitis: Secondary | ICD-10-CM | POA: Diagnosis not present

## 2021-01-15 DIAGNOSIS — J3081 Allergic rhinitis due to animal (cat) (dog) hair and dander: Secondary | ICD-10-CM | POA: Diagnosis not present

## 2021-01-15 DIAGNOSIS — J3089 Other allergic rhinitis: Secondary | ICD-10-CM | POA: Diagnosis not present

## 2021-01-15 DIAGNOSIS — J301 Allergic rhinitis due to pollen: Secondary | ICD-10-CM | POA: Diagnosis not present

## 2021-01-17 ENCOUNTER — Ambulatory Visit: Payer: Medicare PPO | Admitting: Podiatry

## 2021-01-17 ENCOUNTER — Other Ambulatory Visit: Payer: Self-pay

## 2021-01-17 DIAGNOSIS — M79675 Pain in left toe(s): Secondary | ICD-10-CM

## 2021-01-17 DIAGNOSIS — M79674 Pain in right toe(s): Secondary | ICD-10-CM

## 2021-01-17 DIAGNOSIS — B351 Tinea unguium: Secondary | ICD-10-CM

## 2021-01-17 NOTE — Progress Notes (Signed)
Subjective: 70 year old female presents the office they for concerns of elongated, thick toenails that are hard for her to trim and for a callus on the left foot. No open lesions, swelling or other issues she reports. Denies any redness or drainage or any swelling to the callus or toenail site.  She has no other concerns.     Objective: AAO x3, NAD DP/PT pulses palpable bilaterally, CRT less than 3 seconds Nails are hypertrophic, dystrophic, brittle, discolored, elongated 10. No surrounding redness or drainage. Tenderness nails 1-5 bilaterally. No open lesions or pre-ulcerative lesions are identified today. Minimal hyperkeratotic tissue is present left foot medial hallux and left submetatarsal area.  There is no ongoing ulceration drainage or signs of infection. Hammertoes present bilaterally No pain with calf compression, swelling, warmth, erythema No acute changes otherwsie  Assessment: Symptomatic onychomycosis, hyperkeratotic lesion  Plan: -All treatment options discussed with the patient including all alternatives, risks, complications.  -Nails sharply debrided x 10 without complications or bleeding.  -Hyperkeratotic lesions sharply debrided x 2 without any complications or bleeding- calluses were minimal.  -Discussed toe exercises for the hammertoes and supportive shoes.  -Continue moisturizer and supportive shoes -Daily foot inspection  Celesta Gentile, DPM

## 2021-01-22 ENCOUNTER — Telehealth: Payer: Self-pay | Admitting: Family Medicine

## 2021-01-22 DIAGNOSIS — Z1231 Encounter for screening mammogram for malignant neoplasm of breast: Secondary | ICD-10-CM

## 2021-01-22 NOTE — Telephone Encounter (Signed)
Order placed

## 2021-01-22 NOTE — Telephone Encounter (Signed)
Patient need an order for 3d mammogram

## 2021-01-29 DIAGNOSIS — J301 Allergic rhinitis due to pollen: Secondary | ICD-10-CM | POA: Diagnosis not present

## 2021-01-29 DIAGNOSIS — J3081 Allergic rhinitis due to animal (cat) (dog) hair and dander: Secondary | ICD-10-CM | POA: Diagnosis not present

## 2021-01-29 DIAGNOSIS — J3089 Other allergic rhinitis: Secondary | ICD-10-CM | POA: Diagnosis not present

## 2021-01-30 DIAGNOSIS — Z981 Arthrodesis status: Secondary | ICD-10-CM | POA: Diagnosis not present

## 2021-01-30 DIAGNOSIS — M5136 Other intervertebral disc degeneration, lumbar region: Secondary | ICD-10-CM | POA: Diagnosis not present

## 2021-02-04 DIAGNOSIS — J4540 Moderate persistent asthma, uncomplicated: Secondary | ICD-10-CM | POA: Diagnosis not present

## 2021-02-10 ENCOUNTER — Encounter (HOSPITAL_BASED_OUTPATIENT_CLINIC_OR_DEPARTMENT_OTHER): Payer: Self-pay

## 2021-02-10 ENCOUNTER — Other Ambulatory Visit: Payer: Self-pay

## 2021-02-10 ENCOUNTER — Ambulatory Visit (HOSPITAL_BASED_OUTPATIENT_CLINIC_OR_DEPARTMENT_OTHER)
Admission: RE | Admit: 2021-02-10 | Discharge: 2021-02-10 | Disposition: A | Discharge status: Home / Self Care | Payer: Medicare PPO | Source: Ambulatory Visit | Attending: Family Medicine | Admitting: Family Medicine

## 2021-02-10 DIAGNOSIS — Z1231 Encounter for screening mammogram for malignant neoplasm of breast: Secondary | ICD-10-CM | POA: Diagnosis not present

## 2021-02-12 DIAGNOSIS — J3089 Other allergic rhinitis: Secondary | ICD-10-CM | POA: Diagnosis not present

## 2021-02-12 DIAGNOSIS — J301 Allergic rhinitis due to pollen: Secondary | ICD-10-CM | POA: Diagnosis not present

## 2021-02-12 DIAGNOSIS — J3081 Allergic rhinitis due to animal (cat) (dog) hair and dander: Secondary | ICD-10-CM | POA: Diagnosis not present

## 2021-03-04 DIAGNOSIS — J4540 Moderate persistent asthma, uncomplicated: Secondary | ICD-10-CM | POA: Diagnosis not present

## 2021-03-05 ENCOUNTER — Other Ambulatory Visit: Payer: Self-pay | Admitting: Family Medicine

## 2021-03-05 DIAGNOSIS — M797 Fibromyalgia: Secondary | ICD-10-CM

## 2021-03-05 DIAGNOSIS — F411 Generalized anxiety disorder: Secondary | ICD-10-CM

## 2021-03-05 MED ORDER — NORTRIPTYLINE HCL 10 MG PO CAPS: 10.0000 mg | ORAL_CAPSULE | Freq: Every day | ORAL | 2 refills | Status: DC

## 2021-03-18 DIAGNOSIS — L821 Other seborrheic keratosis: Secondary | ICD-10-CM | POA: Diagnosis not present

## 2021-03-18 DIAGNOSIS — L814 Other melanin hyperpigmentation: Secondary | ICD-10-CM | POA: Diagnosis not present

## 2021-03-18 DIAGNOSIS — L579 Skin changes due to chronic exposure to nonionizing radiation, unspecified: Secondary | ICD-10-CM | POA: Diagnosis not present

## 2021-03-18 DIAGNOSIS — R419 Unspecified symptoms and signs involving cognitive functions and awareness: Secondary | ICD-10-CM | POA: Diagnosis not present

## 2021-03-18 DIAGNOSIS — Z85828 Personal history of other malignant neoplasm of skin: Secondary | ICD-10-CM | POA: Diagnosis not present

## 2021-03-19 DIAGNOSIS — R419 Unspecified symptoms and signs involving cognitive functions and awareness: Secondary | ICD-10-CM | POA: Diagnosis not present

## 2021-03-20 DIAGNOSIS — R419 Unspecified symptoms and signs involving cognitive functions and awareness: Secondary | ICD-10-CM | POA: Diagnosis not present

## 2021-03-20 DIAGNOSIS — M47816 Spondylosis without myelopathy or radiculopathy, lumbar region: Secondary | ICD-10-CM | POA: Diagnosis not present

## 2021-03-21 DIAGNOSIS — R419 Unspecified symptoms and signs involving cognitive functions and awareness: Secondary | ICD-10-CM | POA: Diagnosis not present

## 2021-03-25 ENCOUNTER — Encounter: Payer: Self-pay | Admitting: Family Medicine

## 2021-03-25 ENCOUNTER — Ambulatory Visit (INDEPENDENT_AMBULATORY_CARE_PROVIDER_SITE_OTHER): Payer: Medicare PPO | Admitting: Family Medicine

## 2021-03-25 ENCOUNTER — Other Ambulatory Visit: Payer: Self-pay

## 2021-03-25 VITALS — BP 122/82 | HR 94 | Temp 98.2°F | Ht 64.0 in | Wt 175.0 lb

## 2021-03-25 DIAGNOSIS — E782 Mixed hyperlipidemia: Secondary | ICD-10-CM | POA: Diagnosis not present

## 2021-03-25 DIAGNOSIS — Z8639 Personal history of other endocrine, nutritional and metabolic disease: Secondary | ICD-10-CM

## 2021-03-25 DIAGNOSIS — R7303 Prediabetes: Secondary | ICD-10-CM

## 2021-03-25 DIAGNOSIS — Z1159 Encounter for screening for other viral diseases: Secondary | ICD-10-CM

## 2021-03-25 DIAGNOSIS — Z0000 Encounter for general adult medical examination without abnormal findings: Secondary | ICD-10-CM

## 2021-03-25 DIAGNOSIS — E2839 Other primary ovarian failure: Secondary | ICD-10-CM | POA: Diagnosis not present

## 2021-03-25 DIAGNOSIS — F3342 Major depressive disorder, recurrent, in full remission: Secondary | ICD-10-CM | POA: Diagnosis not present

## 2021-03-25 LAB — COMPREHENSIVE METABOLIC PANEL
ALT: 16 U/L (ref 0–35)
AST: 21 U/L (ref 0–37)
Albumin: 4.5 g/dL (ref 3.5–5.2)
Alkaline Phosphatase: 90 U/L (ref 39–117)
BUN: 20 mg/dL (ref 6–23)
CO2: 29 mEq/L (ref 19–32)
Calcium: 10 mg/dL (ref 8.4–10.5)
Chloride: 102 mEq/L (ref 96–112)
Creatinine, Ser: 0.88 mg/dL (ref 0.40–1.20)
GFR: 67.4 mL/min (ref >60.00)
Glucose, Bld: 94 mg/dL (ref 70–99)
Potassium: 3.7 mEq/L (ref 3.5–5.1)
Sodium: 139 mEq/L (ref 135–145)
Total Bilirubin: 0.6 mg/dL (ref 0.2–1.2)
Total Protein: 7.1 g/dL (ref 6.0–8.3)

## 2021-03-25 LAB — CBC
HCT: 41.7 % (ref 36.0–46.0)
Hemoglobin: 14.2 g/dL (ref 12.0–15.0)
MCHC: 34.2 g/dL (ref 30.0–36.0)
MCV: 91.1 fl (ref 78.0–100.0)
Platelets: 270 10*3/uL (ref 150.0–400.0)
RBC: 4.57 Mil/uL (ref 3.87–5.11)
RDW: 13.4 % (ref 11.5–15.5)
WBC: 7 10*3/uL (ref 4.0–10.5)

## 2021-03-25 LAB — LIPID PANEL
Cholesterol: 229 mg/dL — ABNORMAL HIGH (ref 0–200)
HDL: 77.2 mg/dL (ref >39.00)
LDL Cholesterol: 137 mg/dL — ABNORMAL HIGH (ref 0–99)
NonHDL: 151.38
Total CHOL/HDL Ratio: 3
Triglycerides: 73 mg/dL (ref 0.0–149.0)
VLDL: 14.6 mg/dL (ref 0.0–40.0)

## 2021-03-25 LAB — HEMOGLOBIN A1C: Hgb A1c MFr Bld: 5.5 % (ref 4.6–6.5)

## 2021-03-25 LAB — T4, FREE: Free T4: 0.91 ng/dL (ref 0.60–1.60)

## 2021-03-25 LAB — TSH: TSH: 2.81 u[IU]/mL (ref 0.35–5.50)

## 2021-03-25 MED ORDER — CLONAZEPAM 0.5 MG PO TABS: 0.2500 mg | ORAL_TABLET | Freq: Two times a day (BID) | ORAL | 2 refills | Status: DC | PRN

## 2021-03-25 NOTE — Progress Notes (Signed)
Chief Complaint  Patient presents with   Annual Exam     Well Woman Lisa Morrison is here for a complete physical.   Her last physical was >1 year ago.  Current diet: in general, a "healthy" diet. Current exercise: yard work. Weight is stable and she denies daytime fatigue. Seatbelt? Yes  Health Maintenance Colonoscopy- Yes- 12/01/17 Shingrix- Yes DEXA- Due Mammogram- Yes Tetanus- No Pneumonia- Yes Hep C screen- No  Past Medical History:  Diagnosis Date   Anxiety    Arthritis    Back pain    CFS (chronic fatigue syndrome)    Depression    denies now   Dyspnea    Edema    due to taking lyrica   Family history of adverse reaction to anesthesia    son had n/v   Fibromyalgia    IBS (irritable bowel syndrome)    Leaky heart valve    Migraine    Orthostatic hypotension    from a  previous surgery    PONV (postoperative nausea and vomiting)    and heart rate goes down " I fainted after surgery when standing in the restroom."   Seasonal allergies    Spondylolisthesis of lumbar region    Wears glasses      Past Surgical History:  Procedure Laterality Date   ABDOMINAL HYSTERECTOMY     back ablasion     03/2018 and 05/2018   BACK SURGERY  2015   CHOLECYSTECTOMY     HERNIA REPAIR     OOPHORECTOMY     VARICOSE VEIN SURGERY      Medications  Current Outpatient Medications on File Prior to Visit  Medication Sig Dispense Refill   acetaminophen (TYLENOL) 650 MG CR tablet Take 1,300 mg by mouth daily.     albuterol (VENTOLIN HFA) 108 (90 Base) MCG/ACT inhaler Inhale 2 puffs into the lungs every 4 (four) hours as needed for wheezing or shortness of breath.      AMITIZA 24 MCG capsule Take 24 mcg by mouth daily.      B Complex-C (SUPER B COMPLEX/VITAMIN C) TABS Take 1 tablet by mouth daily.     buPROPion (WELLBUTRIN SR) 150 MG 12 hr tablet Take 150 mg by mouth 2 (two) times daily.     clonazePAM (KLONOPIN) 0.5 MG tablet Take 0.5 tablets (0.25 mg total) by mouth 2 (two)  times daily as needed for anxiety. 30 tablet 2   FLOVENT HFA 110 MCG/ACT inhaler INHALE 2 PUFFS INTO THE LUNGS IN THE MORNING AND AT BEDTIME. 12 each 1   Fluticasone-Salmeterol 113-14 MCG/ACT AEPB SMARTSIG:1 Inhalation Via Inhaler Twice Daily     levocetirizine (XYZAL) 5 MG tablet      meloxicam (MOBIC) 7.5 MG tablet      montelukast (SINGULAIR) 10 MG tablet      Multiple Vitamins-Minerals (CENTRUM SILVER 50+WOMEN) TABS Take 1 tablet by mouth daily.     nortriptyline (PAMELOR) 10 MG capsule Take 1-2 capsules (10-20 mg total) by mouth at bedtime. 180 capsule 2   Omega-3 Fatty Acids (ULTRA OMEGA-3 FISH OIL) 1400 MG CAPS Take 1,400 mg by mouth daily.     potassium chloride (MICRO-K) 10 MEQ CR capsule TAKE 2 CAPSULES BY MOUTH DAILY. 180 capsule 1   pregabalin (LYRICA) 150 MG capsule TAKE 1 CAPSULE BY MOUTH EVERY NIGHT 90 capsule 2   topiramate (TOPAMAX) 50 MG tablet Take 50 mg by mouth at bedtime.      triamterene-hydrochlorothiazide (DYAZIDE) 37.5-25 MG capsule Take  1 capsule by mouth daily.      Allergies Allergies  Allergen Reactions   Augmentin [Amoxicillin-Pot Clavulanate] Anaphylaxis and Rash    Did it involve swelling of the face/tongue/throat, SOB, or low BP? Yes Did it involve sudden or severe rash/hives, skin peeling, or any reaction on the inside of your mouth or nose? Yes Did you need to seek medical attention at a hospital or doctor's office? Yes When did it last happen? Over 10 years ago If all above answers are "NO", may proceed with cephalosporin use.   Demerol [Meperidine] Other (See Comments)    Cardiac arrest    Review of Systems: Constitutional:  no fevers Eye:  no recent significant change in vision Ears:  No changes in hearing Nose/Mouth/Throat:  no complaints of nasal congestion, no sore throat Cardiovascular: no chest pain Respiratory:  No shortness of breath Gastrointestinal:  No change in bowel habits GU:  Female: negative for dysuria Integumentary:  no  abnormal skin lesions reported Neurologic:  no headaches Endocrine:  denies unexplained weight changes  Exam BP 122/82   Pulse 94   Temp 98.2 F (36.8 C) (Oral)   Ht 5\' 4"  (1.626 m)   Wt 175 lb (79.4 kg)   SpO2 97%   BMI 30.04 kg/m  General:  well developed, well nourished, in no apparent distress Skin:  no significant moles, warts, or growths Head:  no masses, lesions, or tenderness Eyes:  pupils equal and round, sclera anicteric without injection Ears:  canals without lesions, TMs shiny without retraction, no obvious effusion, no erythema Nose:  nares patent, septum midline, mucosa normal, and no drainage or sinus tenderness Throat/Pharynx:  lips and gingiva without lesion; tongue and uvula midline; non-inflamed pharynx; no exudates or postnasal drainage Neck: neck supple without adenopathy, thyromegaly, or masses Lungs:  clear to auscultation, breath sounds equal bilaterally, no respiratory distress Cardio:  regular rate and rhythm, no bruits or LE edema Abdomen:  abdomen soft, nontender; bowel sounds normal; no masses or organomegaly Genital: Deferred Neuro:  gait normal; deep tendon reflexes normal and symmetric Psych: well oriented with normal range of affect and appropriate judgment/insight  Assessment and Plan  Well adult exam  Mixed hyperlipidemia - Plan: Lipid panel, Comprehensive metabolic panel  Prediabetes - Plan: CBC, Hemoglobin A1c  Recurrent major depressive disorder, in full remission ([place]), Chronic  Encounter for hepatitis C screening test for low risk patient - Plan: Hepatitis C antibody  Estrogen deficiency - Plan: DG Bone Density  History of goiter - Plan: TSH, T4, free   Well 70 y.o. female. Counseled on diet and exercise. Other orders as above. DEXA.  Covid booster rec'd.  Follow up in 6 mo or prn. The patient voiced understanding and agreement to the plan.  [place], DO 03/25/21 9:04 AM

## 2021-03-25 NOTE — Patient Instructions (Signed)
Give us 2-3 business days to get the results of your labs back.   Keep the diet clean and stay active.  Let us know if you need anything. 

## 2021-03-26 ENCOUNTER — Other Ambulatory Visit: Payer: Self-pay | Admitting: Family Medicine

## 2021-03-26 LAB — HEPATITIS C ANTIBODY
Hepatitis C Ab: NONREACTIVE
SIGNAL TO CUT-OFF: 0.01 (ref <1.00)

## 2021-03-31 ENCOUNTER — Other Ambulatory Visit: Payer: Self-pay | Admitting: Family Medicine

## 2021-03-31 DIAGNOSIS — J3089 Other allergic rhinitis: Secondary | ICD-10-CM | POA: Diagnosis not present

## 2021-03-31 DIAGNOSIS — J3081 Allergic rhinitis due to animal (cat) (dog) hair and dander: Secondary | ICD-10-CM | POA: Diagnosis not present

## 2021-03-31 DIAGNOSIS — J301 Allergic rhinitis due to pollen: Secondary | ICD-10-CM | POA: Diagnosis not present

## 2021-04-01 DIAGNOSIS — J4540 Moderate persistent asthma, uncomplicated: Secondary | ICD-10-CM | POA: Diagnosis not present

## 2021-04-14 DIAGNOSIS — J3081 Allergic rhinitis due to animal (cat) (dog) hair and dander: Secondary | ICD-10-CM | POA: Diagnosis not present

## 2021-04-14 DIAGNOSIS — J3089 Other allergic rhinitis: Secondary | ICD-10-CM | POA: Diagnosis not present

## 2021-04-14 DIAGNOSIS — J301 Allergic rhinitis due to pollen: Secondary | ICD-10-CM | POA: Diagnosis not present

## 2021-04-15 DIAGNOSIS — Z981 Arthrodesis status: Secondary | ICD-10-CM | POA: Diagnosis not present

## 2021-04-15 DIAGNOSIS — M47816 Spondylosis without myelopathy or radiculopathy, lumbar region: Secondary | ICD-10-CM | POA: Diagnosis not present

## 2021-04-25 ENCOUNTER — Other Ambulatory Visit: Payer: Self-pay

## 2021-04-25 ENCOUNTER — Encounter: Payer: Self-pay | Admitting: Podiatry

## 2021-04-25 ENCOUNTER — Ambulatory Visit: Payer: Medicare PPO | Admitting: Podiatry

## 2021-04-25 DIAGNOSIS — M79675 Pain in left toe(s): Secondary | ICD-10-CM

## 2021-04-25 DIAGNOSIS — M79674 Pain in right toe(s): Secondary | ICD-10-CM | POA: Diagnosis not present

## 2021-04-25 DIAGNOSIS — B351 Tinea unguium: Secondary | ICD-10-CM | POA: Diagnosis not present

## 2021-04-25 DIAGNOSIS — Q828 Other specified congenital malformations of skin: Secondary | ICD-10-CM

## 2021-04-25 NOTE — Progress Notes (Signed)
Subjective: 70 year old female presents the office they for concerns of elongated, thick toenails that are hard for her to trim and for a callus on the left foot.  She says the calluses started to get thicker again as she has not been wearing shoes as much.  No open lesions. Denies any redness or drainage or any swelling to the callus or toenail site.  She has no other concerns.     Objective: AAO x3, NAD DP/PT pulses palpable bilaterally, CRT less than 3 seconds Nails are hypertrophic, dystrophic, brittle, discolored, elongated 10. No surrounding redness or drainage. Tenderness nails 1-5 bilaterally. No open lesions or pre-ulcerative lesions are identified today. Hyperkeratotic tissue is present left foot medial hallux and left submetatarsal area.  There is no ongoing ulceration drainage or signs of infection. Hammertoes present bilaterally No pain with calf compression, swelling, warmth, erythema No acute changes otherwsie  Assessment: Symptomatic onychomycosis, hyperkeratotic lesion  Plan: -All treatment options discussed with the patient including all alternatives, risks, complications.  -Nails sharply debrided x 10 without complications or bleeding.  -Hyperkeratotic lesions sharply debrided x 2 without any complications or bleeding- calluses were minimal.  -Discussed toe exercises for the hammertoes and supportive shoes.  -Continue moisturizer and supportive shoes -Daily foot inspection  Celesta Gentile, DPM

## 2021-04-28 DIAGNOSIS — J301 Allergic rhinitis due to pollen: Secondary | ICD-10-CM | POA: Diagnosis not present

## 2021-04-28 DIAGNOSIS — J3089 Other allergic rhinitis: Secondary | ICD-10-CM | POA: Diagnosis not present

## 2021-04-28 DIAGNOSIS — J4540 Moderate persistent asthma, uncomplicated: Secondary | ICD-10-CM | POA: Diagnosis not present

## 2021-04-29 DIAGNOSIS — J4540 Moderate persistent asthma, uncomplicated: Secondary | ICD-10-CM | POA: Diagnosis not present

## 2021-05-01 ENCOUNTER — Ambulatory Visit (HOSPITAL_BASED_OUTPATIENT_CLINIC_OR_DEPARTMENT_OTHER)
Admission: RE | Admit: 2021-05-01 | Discharge: 2021-05-01 | Disposition: A | Discharge status: Home / Self Care | Payer: Medicare PPO | Source: Ambulatory Visit | Attending: Family Medicine | Admitting: Family Medicine

## 2021-05-01 ENCOUNTER — Other Ambulatory Visit: Payer: Self-pay

## 2021-05-01 DIAGNOSIS — E2839 Other primary ovarian failure: Secondary | ICD-10-CM | POA: Insufficient documentation

## 2021-05-01 DIAGNOSIS — Z136 Encounter for screening for cardiovascular disorders: Secondary | ICD-10-CM | POA: Insufficient documentation

## 2021-05-01 DIAGNOSIS — Z8262 Family history of osteoporosis: Secondary | ICD-10-CM | POA: Insufficient documentation

## 2021-05-01 DIAGNOSIS — M85832 Other specified disorders of bone density and structure, left forearm: Secondary | ICD-10-CM | POA: Insufficient documentation

## 2021-05-01 DIAGNOSIS — Z13820 Encounter for screening for osteoporosis: Secondary | ICD-10-CM | POA: Diagnosis not present

## 2021-05-01 DIAGNOSIS — Z780 Asymptomatic menopausal state: Secondary | ICD-10-CM | POA: Diagnosis not present

## 2021-05-14 DIAGNOSIS — J301 Allergic rhinitis due to pollen: Secondary | ICD-10-CM | POA: Diagnosis not present

## 2021-05-14 DIAGNOSIS — J3089 Other allergic rhinitis: Secondary | ICD-10-CM | POA: Diagnosis not present

## 2021-05-14 DIAGNOSIS — J3081 Allergic rhinitis due to animal (cat) (dog) hair and dander: Secondary | ICD-10-CM | POA: Diagnosis not present

## 2021-05-27 DIAGNOSIS — J4540 Moderate persistent asthma, uncomplicated: Secondary | ICD-10-CM | POA: Diagnosis not present

## 2021-05-28 DIAGNOSIS — J3089 Other allergic rhinitis: Secondary | ICD-10-CM | POA: Diagnosis not present

## 2021-05-28 DIAGNOSIS — J3081 Allergic rhinitis due to animal (cat) (dog) hair and dander: Secondary | ICD-10-CM | POA: Diagnosis not present

## 2021-05-28 DIAGNOSIS — J301 Allergic rhinitis due to pollen: Secondary | ICD-10-CM | POA: Diagnosis not present

## 2021-05-29 ENCOUNTER — Ambulatory Visit (INDEPENDENT_AMBULATORY_CARE_PROVIDER_SITE_OTHER): Payer: Medicare PPO

## 2021-05-29 VITALS — Ht 64.0 in | Wt 175.0 lb

## 2021-05-29 DIAGNOSIS — Z0000 Encounter for general adult medical examination without abnormal findings: Secondary | ICD-10-CM | POA: Diagnosis not present

## 2021-05-29 NOTE — Patient Instructions (Signed)
Ms. Lisa Morrison , Thank you for taking time to complete your Medicare Wellness Visit. I appreciate your ongoing commitment to your health goals. Please review the following plan we discussed and let me know if I can assist you in the future.   Screening recommendations/referrals: Colonoscopy: Completed 12/01/2017-Due 12/02/2027 Mammogram: Completed 02/10/2021-Due 02/10/2022 Bone Density: Completed 05/01/2021-Due 05/02/2023 Recommended yearly ophthalmology/optometry visit for glaucoma screening and checkup Recommended yearly dental visit for hygiene and checkup  Vaccinations: Influenza vaccine: Due-May obtain vacicne at our office or your local pharmacy Pneumococcal vaccine: Up to date Tdap vaccine: Discuss with pharmacy Shingles vaccine: Completed vaccines   Covid-19:Booster available at the pharmacy.  Advanced directives: Please bring a copy for your chart  Conditions/risks identified: See problem list  Next appointment: Follow up in one year for your annual wellness visit 06/01/2022 @ 10:20   Preventive Care 69 Years and Older, Female Preventive care refers to lifestyle choices and visits with your health care provider that can promote health and wellness. What does preventive care include? A yearly physical exam. This is also called an annual well check. Dental exams once or twice a year. Routine eye exams. Ask your health care provider how often you should have your eyes checked. Personal lifestyle choices, including: Daily care of your teeth and gums. Regular physical activity. Eating a healthy diet. Avoiding tobacco and drug use. Limiting alcohol use. Practicing safe sex. Taking low-dose aspirin every day. Taking vitamin and mineral supplements as recommended by your health care provider. What happens during an annual well check? The services and screenings done by your health care provider during your annual well check will depend on your age, overall health, lifestyle risk factors,  and family history of disease. Counseling  Your health care provider may ask you questions about your: Alcohol use. Tobacco use. Drug use. Emotional well-being. Home and relationship well-being. Sexual activity. Eating habits. History of falls. Memory and ability to understand (cognition). Work and work Statistician. Reproductive health. Screening  You may have the following tests or measurements: Height, weight, and BMI. Blood pressure. Lipid and cholesterol levels. These may be checked every 5 years, or more frequently if you are over 11 years old. Skin check. Lung cancer screening. You may have this screening every year starting at age 18 if you have a 30-pack-year history of smoking and currently smoke or have quit within the past 15 years. Fecal occult blood test (FOBT) of the stool. You may have this test every year starting at age 64. Flexible sigmoidoscopy or colonoscopy. You may have a sigmoidoscopy every 5 years or a colonoscopy every 10 years starting at age 44. Hepatitis C blood test. Hepatitis B blood test. Sexually transmitted disease (STD) testing. Diabetes screening. This is done by checking your blood sugar (glucose) after you have not eaten for a while (fasting). You may have this done every 1-3 years. Bone density scan. This is done to screen for osteoporosis. You may have this done starting at age 16. Mammogram. This may be done every 1-2 years. Talk to your health care provider about how often you should have regular mammograms. Talk with your health care provider about your test results, treatment options, and if necessary, the need for more tests. Vaccines  Your health care provider may recommend certain vaccines, such as: Influenza vaccine. This is recommended every year. Tetanus, diphtheria, and acellular pertussis (Tdap, Td) vaccine. You may need a Td booster every 10 years. Zoster vaccine. You may need this after age 73. Pneumococcal 13-valent  conjugate  (PCV13) vaccine. One dose is recommended after age 44. Pneumococcal polysaccharide (PPSV23) vaccine. One dose is recommended after age 81. Talk to your health care provider about which screenings and vaccines you need and how often you need them. This information is not intended to replace advice given to you by your health care provider. Make sure you discuss any questions you have with your health care provider. Document Released: 09/20/2015 Document Revised: 05/13/2016 Document Reviewed: 06/25/2015 Elsevier Interactive Patient Education  2017 [place] Prevention in the Home Falls can cause injuries. They can happen to people of all ages. There are many things you can do to make your home safe and to help prevent falls. What can I do on the outside of my home? Regularly fix the edges of walkways and driveways and fix any cracks. Remove anything that might make you trip as you walk through a door, such as a raised step or threshold. Trim any bushes or trees on the path to your home. Use bright outdoor lighting. Clear any walking paths of anything that might make someone trip, such as rocks or tools. Regularly check to see if handrails are loose or broken. Make sure that both sides of any steps have handrails. Any raised decks and porches should have guardrails on the edges. Have any leaves, snow, or ice cleared regularly. Use sand or salt on walking paths during winter. Clean up any spills in your garage right away. This includes oil or grease spills. What can I do in the bathroom? Use night lights. Install grab bars by the toilet and in the tub and shower. Do not use towel bars as grab bars. Use non-skid mats or decals in the tub or shower. If you need to sit down in the shower, use a plastic, non-slip stool. Keep the floor dry. Clean up any water that spills on the floor as soon as it happens. Remove soap buildup in the tub or shower regularly. Attach bath mats securely with  double-sided non-slip rug tape. Do not have throw rugs and other things on the floor that can make you trip. What can I do in the bedroom? Use night lights. Make sure that you have a light by your bed that is easy to reach. Do not use any sheets or blankets that are too big for your bed. They should not hang down onto the floor. Have a firm chair that has side arms. You can use this for support while you get dressed. Do not have throw rugs and other things on the floor that can make you trip. What can I do in the kitchen? Clean up any spills right away. Avoid walking on wet floors. Keep items that you use a lot in easy-to-reach places. If you need to reach something above you, use a strong step stool that has a grab bar. Keep electrical cords out of the way. Do not use floor polish or wax that makes floors slippery. If you must use wax, use non-skid floor wax. Do not have throw rugs and other things on the floor that can make you trip. What can I do with my stairs? Do not leave any items on the stairs. Make sure that there are handrails on both sides of the stairs and use them. Fix handrails that are broken or loose. Make sure that handrails are as long as the stairways. Check any carpeting to make sure that it is firmly attached to the stairs. Fix any carpet that  is loose or worn. Avoid having throw rugs at the top or bottom of the stairs. If you do have throw rugs, attach them to the floor with carpet tape. Make sure that you have a light switch at the top of the stairs and the bottom of the stairs. If you do not have them, ask someone to add them for you. What else can I do to help prevent falls? Wear shoes that: Do not have high heels. Have rubber bottoms. Are comfortable and fit you well. Are closed at the toe. Do not wear sandals. If you use a stepladder: Make sure that it is fully opened. Do not climb a closed stepladder. Make sure that both sides of the stepladder are locked  into place. Ask someone to hold it for you, if possible. Clearly mark and make sure that you can see: Any grab bars or handrails. First and last steps. Where the edge of each step is. Use tools that help you move around (mobility aids) if they are needed. These include: Canes. Walkers. Scooters. Crutches. Turn on the lights when you go into a dark area. Replace any light bulbs as soon as they burn out. Set up your furniture so you have a clear path. Avoid moving your furniture around. If any of your floors are uneven, fix them. If there are any pets around you, be aware of where they are. Review your medicines with your doctor. Some medicines can make you feel dizzy. This can increase your chance of falling. Ask your doctor what other things that you can do to help prevent falls. This information is not intended to replace advice given to you by your health care provider. Make sure you discuss any questions you have with your health care provider. Document Released: 06/20/2009 Document Revised: 01/30/2016 Document Reviewed: 09/28/2014 Elsevier Interactive Patient Education  2017 [employer].

## 2021-05-29 NOTE — Progress Notes (Signed)
Subjective:   Lisa Morrison is a 70 y.o. female who presents for an Initial Medicare Annual Wellness Visit.  I connected with Nolyn today by telephone and verified that I am speaking with the correct person using two identifiers. Location patient: home Location provider: work Persons participating in the virtual visit: patient, Marine scientist.    I discussed the limitations, risks, security and privacy concerns of performing an evaluation and management service by telephone and the availability of in person appointments. I also discussed with the patient that there may be a patient responsible charge related to this service. The patient expressed understanding and verbally consented to this telephonic visit.    Interactive audio and video telecommunications were attempted between this provider and patient, however failed, due to patient having technical difficulties OR patient did not have access to video capability.  We continued and completed visit with audio only.  Some vital signs may be absent or patient reported.   Time Spent with patient on telephone encounter: 30 minutes   Review of Systems     Cardiac Risk Factors include: advanced age (>76men, >94 women);dyslipidemia;sedentary lifestyle     Objective:    Today's Vitals   05/29/21 0938  Weight: 175 lb (79.4 kg)  Height: 5\' 4"  (1.626 m)   Body mass index is 30.04 kg/m.  Advanced Directives 05/29/2021 03/20/2019 03/16/2019 11/18/2018  Does Patient Have a Medical Advance Directive? Yes Yes Yes Yes  Type of Paramedic of [place];Living will [place];Living will [place];Living will [place];Living will  Does patient want to make changes to medical advance directive? - - No - Patient declined No - Patient declined  Copy of [place] in Chart? No - copy requested No - copy requested No - copy requested No - copy requested    Current  Medications (verified) Outpatient Encounter Medications as of 05/29/2021  Medication Sig   acetaminophen (TYLENOL) 650 MG CR tablet Take 1,300 mg by mouth daily.   AMITIZA 24 MCG capsule Take 24 mcg by mouth daily.    buPROPion (WELLBUTRIN SR) 150 MG 12 hr tablet Take 1 tablet by mouth 2 (two) times daily.   Calcium Carbonate-Vit D-Min (CALTRATE 600+D PLUS MINERALS) 600-800 MG-UNIT TABS Take by mouth.   FLOVENT HFA 110 MCG/ACT inhaler INHALE 2 PUFFS INTO THE LUNGS IN THE MORNING AND AT BEDTIME.   levocetirizine (XYZAL) 5 MG tablet    meloxicam (MOBIC) 7.5 MG tablet    montelukast (SINGULAIR) 10 MG tablet Take by mouth.   Multiple Vitamins-Minerals (CENTRUM SILVER 50+WOMEN) TABS Take 1 tablet by mouth daily.   nortriptyline (PAMELOR) 10 MG capsule Take 1-2 capsules (10-20 mg total) by mouth at bedtime.   potassium chloride (MICRO-K) 10 MEQ CR capsule TAKE 2 CAPSULES BY MOUTH DAILY.   pregabalin (LYRICA) 150 MG capsule TAKE 1 CAPSULE BY MOUTH EVERY DAY AT NIGHT   topiramate (TOPAMAX) 50 MG tablet Take 50 mg by mouth at bedtime.    triamterene-hydrochlorothiazide (DYAZIDE) 37.5-25 MG capsule Take 1 capsule by mouth daily.   EPIPEN 2-PAK 0.3 MG/0.3ML SOAJ injection SMARTSIG:1 Pre-Filled Pen Syringe IM As Directed (Patient not taking: Reported on 05/29/2021)   [DISCONTINUED] albuterol (VENTOLIN HFA) 108 (90 Base) MCG/ACT inhaler Inhale 2 puffs into the lungs every 4 (four) hours as needed for wheezing or shortness of breath.    [DISCONTINUED] B Complex-C (SUPER B COMPLEX/VITAMIN C) TABS Take 1 tablet by mouth daily.   [DISCONTINUED]  buPROPion (WELLBUTRIN SR) 150 MG 12 hr tablet Take 150 mg by mouth 2 (two) times daily.   [DISCONTINUED] clonazePAM (KLONOPIN) 0.5 MG tablet Take 0.5 tablets (0.25 mg total) by mouth 2 (two) times daily as needed for anxiety.   [DISCONTINUED] Fluticasone-Salmeterol 113-14 MCG/ACT AEPB SMARTSIG:1 Inhalation Via Inhaler Twice Daily   [DISCONTINUED] montelukast (SINGULAIR)  10 MG tablet    [DISCONTINUED] Omega-3 Fatty Acids (ULTRA OMEGA-3 FISH OIL) 1400 MG CAPS Take 1,400 mg by mouth daily.   No facility-administered encounter medications on file as of 05/29/2021.    Allergies (verified) Augmentin [amoxicillin-pot clavulanate] and Demerol [meperidine]   History: Past Medical History:  Diagnosis Date   Anxiety    Arthritis    Back pain    CFS (chronic fatigue syndrome)    Depression    denies now   Dyspnea    Edema    due to taking lyrica   Family history of adverse reaction to anesthesia    son had n/v   Fibromyalgia    IBS (irritable bowel syndrome)    Leaky heart valve    Migraine    Orthostatic hypotension    from a  previous surgery    PONV (postoperative nausea and vomiting)    and heart rate goes down " I fainted after surgery when standing in the restroom."   Seasonal allergies    Spondylolisthesis of lumbar region    Wears glasses    Past Surgical History:  Procedure Laterality Date   ABDOMINAL HYSTERECTOMY     back ablasion     03/2018 and 05/2018   BACK SURGERY  2015   CHOLECYSTECTOMY     HERNIA REPAIR     OOPHORECTOMY     VARICOSE VEIN SURGERY     Family History  Problem Relation Age of Onset   Mitral valve prolapse Mother    Coronary aneurysm Father    Diabetes Father    Social History   Socioeconomic History   Marital status: Married    Spouse name: Not on file   Number of children: Not on file   Years of education: Not on file   Highest education level: Not on file  Occupational History   Not on file  Tobacco Use   Smoking status: Never   Smokeless tobacco: Never  Vaping Use   Vaping Use: Never used  Substance and Sexual Activity   Alcohol use: Never   Drug use: Never   Sexual activity: Not on file  Other Topics Concern   Not on file  Social History Narrative   Not on file   Social Determinants of Health   Financial Resource Strain: Low Risk    Difficulty of Paying Living Expenses: Not hard at all   Food Insecurity: No Food Insecurity   Worried About Charity fundraiser in the Last Year: Never true   Ran Out of Food in the Last Year: Never true  Transportation Needs: No Transportation Needs   Lack of Transportation (Medical): No   Lack of Transportation (Non-Medical): No  Physical Activity: Inactive   Days of Exercise per Week: 0 days   Minutes of Exercise per Session: 0 min  Stress: No Stress Concern Present   Feeling of Stress : Not at all  Social Connections: Moderately Integrated   Frequency of Communication with Friends and Family: More than three times a week   Frequency of Social Gatherings with Friends and Family: More than three times a week   Attends Religious  Services: Never   Marine scientist or Organizations: Yes   Attends Archivist Meetings: 1 to 4 times per year   Marital Status: Married    Tobacco Counseling Counseling given: Not Answered   Clinical Intake:  Pre-visit preparation completed: Yes  Pain : No/denies pain     BMI - recorded: 30.04 Nutritional Status: BMI > 30  Obese Nutritional Risks: None Diabetes: No  How often do you need to have someone help you when you read instructions, pamphlets, or other written materials from your doctor or pharmacy?: 1 - Never  Diabetic?No  Interpreter Needed?: No  Information entered by :: Caroleen Hamman LPN   Activities of Daily Living In your present state of health, do you have any difficulty performing the following activities: 05/29/2021 03/25/2021  Hearing? N N  Vision? N N  Difficulty concentrating or making decisions? N N  Walking or climbing stairs? N N  Dressing or bathing? N N  Doing errands, shopping? N N  Preparing Food and eating ? N -  Using the Toilet? N -  In the past six months, have you accidently leaked urine? Y -  Comment occasionally -  Do you have problems with loss of bowel control? N -  Managing your Medications? N -  Managing your Finances? N -   Housekeeping or managing your Housekeeping? N -  Some recent data might be hidden    Patient Care Team: Shelda Pal, DO as PCP - General (Family Medicine)  Indicate any recent Medical Services you may have received from other than [facility] providers in the past year (date may be approximate).     Assessment:   This is a routine wellness examination for Raneem.  Hearing/Vision screen Hearing Screening - Comments:: No issues Vision Screening - Comments:: Wears glasses Last eye exam-Dr Walker-10/2020  Dietary issues and exercise activities discussed: Current Exercise Habits: The patient does not participate in regular exercise at present, Exercise limited by: orthopedic condition(s) (back pain)   Goals Addressed             This Visit's Progress    Patient Stated       Increase activity as tolerated       Depression Screen PHQ 2/9 Scores 05/29/2021  PHQ - 2 Score 0    Fall Risk Fall Risk  05/29/2021  Falls in the past year? 0  Number falls in past yr: 0  Injury with Fall? 0  Follow up Falls prevention discussed    FALL RISK PREVENTION PERTAINING TO THE HOME:  Any stairs in or around the home? No  Home free of loose throw rugs in walkways, pet beds, electrical cords, etc? Yes  Adequate lighting in your home to reduce risk of falls? Yes   ASSISTIVE DEVICES UTILIZED TO PREVENT FALLS:  Life alert? No  Use of a cane, walker or w/c? No  Grab bars in the bathroom? No  Shower chair or bench in shower? No  Elevated toilet seat or a handicapped toilet? No   TIMED UP AND GO:  Was the test performed? No . Phone visit   Cognitive Function:Normal cognitive status assessed by  this Nurse Health Advisor. No abnormalities found.          Immunizations Immunization History  Administered Date(s) Administered   Influenza, High Dose Seasonal PF 07/06/2018, 06/21/2019, 05/08/2020   Influenza,inj,Quad PF,6+ Mos 06/30/2017   Influenza-Unspecified 06/24/2016,  05/08/2021   Janssen (J&J) SARS-COV-2 Vaccination 05/02/2020, 07/03/2020   PNEUMOCOCCAL  CONJUGATE-20 11/23/2020   Pneumococcal Conjugate-13 06/30/2017   Zoster Recombinat (Shingrix) 08/16/2018, 11/23/2020   Zoster, Live 09/08/2011    TDAP status: Due, Education has been provided regarding the importance of this vaccine. Advised may receive this vaccine at local pharmacy or Health Dept. Aware to provide a copy of the vaccination record if obtained from local pharmacy or Health Dept. Verbalized acceptance and understanding.  Flu Vaccine status: Due, Education has been provided regarding the importance of this vaccine. Advised may receive this vaccine at local pharmacy or Health Dept. Aware to provide a copy of the vaccination record if obtained from local pharmacy or Health Dept. Verbalized acceptance and understanding.  Pneumococcal vaccine status: Up to date  Covid-19 vaccine status: Information provided on how to obtain vaccines.  Booster due  Qualifies for Shingles Vaccine? No   Zostavax completed Yes   Shingrix Completed?: Yes  Screening Tests Health Maintenance  Topic Date Due   COVID-19 Vaccine (3 - Booster for Janssen series) 11/03/2020   MAMMOGRAM  02/11/2023   COLONOSCOPY (Pts 45-32yrs Insurance coverage will need to be confirmed)  12/02/2027   INFLUENZA VACCINE  Completed   DEXA SCAN  Completed   Hepatitis C Screening  Completed   Zoster Vaccines- Shingrix  Completed   HPV VACCINES  Aged Out   TETANUS/TDAP  Discontinued    Health Maintenance  Health Maintenance Due  Topic Date Due   COVID-19 Vaccine (3 - Booster for Janssen series) 11/03/2020    Colorectal cancer screening: Type of screening: Colonoscopy. Completed 12/01/2017. Repeat every 10 years  Mammogram status: Completed bilateral 02/10/2021. Repeat every year  Bone Density status: Completed 05/01/2021. Results reflect: Bone density results: OSTEOPENIA. Repeat every 2 years.  Lung Cancer Screening: (Low Dose  CT Chest recommended if Age 90-80 years, 30 pack-year currently smoking OR have quit w/in 15years.) does not qualify.    Additional Screening:  Hepatitis C Screening: Completed 03/25/2021  Vision Screening: Recommended annual ophthalmology exams for early detection of glaucoma and other disorders of the eye. Is the patient up to date with their annual eye exam?  Yes  Who is the provider or what is the name of the office in which the patient attends annual eye exams? Dr. Gilford Rile   Dental Screening: Recommended annual dental exams for proper oral hygiene  Community Resource Referral / Chronic Care Management: CRR required this visit?  No   CCM required this visit?  No      Plan:     I have personally reviewed and noted the following in the patient's chart:   Medical and social history Use of alcohol, tobacco or illicit drugs  Current medications and supplements including opioid prescriptions. Patient is not currently taking opioid prescriptions. Functional ability and status Nutritional status Physical activity Advanced directives List of other physicians Hospitalizations, surgeries, and ER visits in previous 12 months Vitals Screenings to include cognitive, depression, and falls Referrals and appointments  In addition, I have reviewed and discussed with patient certain preventive protocols, quality metrics, and best practice recommendations. A written personalized care plan for preventive services as well as general preventive health recommendations were provided to patient.   Due to this being a telephonic visit, the after visit summary with patients personalized plan was offered to patient via mail or my-chart. Patient would like to access on my-chart.  Marta Antu, LPN   1/61/0960  Nurse Health Advisor  Nurse Notes: None

## 2021-06-02 DIAGNOSIS — Z79899 Other long term (current) drug therapy: Secondary | ICD-10-CM | POA: Diagnosis not present

## 2021-06-02 DIAGNOSIS — M797 Fibromyalgia: Secondary | ICD-10-CM | POA: Diagnosis not present

## 2021-06-02 DIAGNOSIS — K5909 Other constipation: Secondary | ICD-10-CM | POA: Diagnosis not present

## 2021-06-02 DIAGNOSIS — Z791 Long term (current) use of non-steroidal anti-inflammatories (NSAID): Secondary | ICD-10-CM | POA: Diagnosis not present

## 2021-06-02 DIAGNOSIS — M545 Low back pain, unspecified: Secondary | ICD-10-CM | POA: Diagnosis not present

## 2021-06-02 DIAGNOSIS — K5904 Chronic idiopathic constipation: Secondary | ICD-10-CM | POA: Diagnosis not present

## 2021-06-02 DIAGNOSIS — Z1211 Encounter for screening for malignant neoplasm of colon: Secondary | ICD-10-CM | POA: Diagnosis not present

## 2021-06-11 DIAGNOSIS — M961 Postlaminectomy syndrome, not elsewhere classified: Secondary | ICD-10-CM | POA: Diagnosis not present

## 2021-06-11 DIAGNOSIS — Z981 Arthrodesis status: Secondary | ICD-10-CM | POA: Diagnosis not present

## 2021-06-11 DIAGNOSIS — M5380 Other specified dorsopathies, site unspecified: Secondary | ICD-10-CM | POA: Diagnosis not present

## 2021-06-11 DIAGNOSIS — G894 Chronic pain syndrome: Secondary | ICD-10-CM | POA: Diagnosis not present

## 2021-06-13 DIAGNOSIS — J3081 Allergic rhinitis due to animal (cat) (dog) hair and dander: Secondary | ICD-10-CM | POA: Diagnosis not present

## 2021-06-13 DIAGNOSIS — J301 Allergic rhinitis due to pollen: Secondary | ICD-10-CM | POA: Diagnosis not present

## 2021-06-13 DIAGNOSIS — J3089 Other allergic rhinitis: Secondary | ICD-10-CM | POA: Diagnosis not present

## 2021-06-16 DIAGNOSIS — M961 Postlaminectomy syndrome, not elsewhere classified: Secondary | ICD-10-CM | POA: Diagnosis not present

## 2021-06-24 ENCOUNTER — Other Ambulatory Visit: Payer: Self-pay | Admitting: Family Medicine

## 2021-06-24 DIAGNOSIS — M5416 Radiculopathy, lumbar region: Secondary | ICD-10-CM | POA: Diagnosis not present

## 2021-06-24 DIAGNOSIS — J4540 Moderate persistent asthma, uncomplicated: Secondary | ICD-10-CM | POA: Diagnosis not present

## 2021-06-24 DIAGNOSIS — M961 Postlaminectomy syndrome, not elsewhere classified: Secondary | ICD-10-CM | POA: Diagnosis not present

## 2021-06-25 DIAGNOSIS — J3089 Other allergic rhinitis: Secondary | ICD-10-CM | POA: Diagnosis not present

## 2021-06-25 DIAGNOSIS — J3081 Allergic rhinitis due to animal (cat) (dog) hair and dander: Secondary | ICD-10-CM | POA: Diagnosis not present

## 2021-06-25 DIAGNOSIS — J301 Allergic rhinitis due to pollen: Secondary | ICD-10-CM | POA: Diagnosis not present

## 2021-07-07 IMAGING — MG MM DIGITAL SCREENING BILAT W/ TOMO AND CAD
8 series · 8 of 24 positions shown · non-contrast
Comparison: Previous exam(s).

ACR Breast Density Category a: The breast tissue is almost entirely
fatty.

CLINICAL DATA: Screening.

EXAM:
DIGITAL SCREENING BILATERAL MAMMOGRAM WITH TOMOSYNTHESIS AND CAD
TECHNIQUE: Bilateral screening digital craniocaudal and mediolateral oblique
mammograms were obtained. Bilateral screening digital breast
tomosynthesis was performed. The images were evaluated with
computer-aided detection.

[R CC synth-2D]
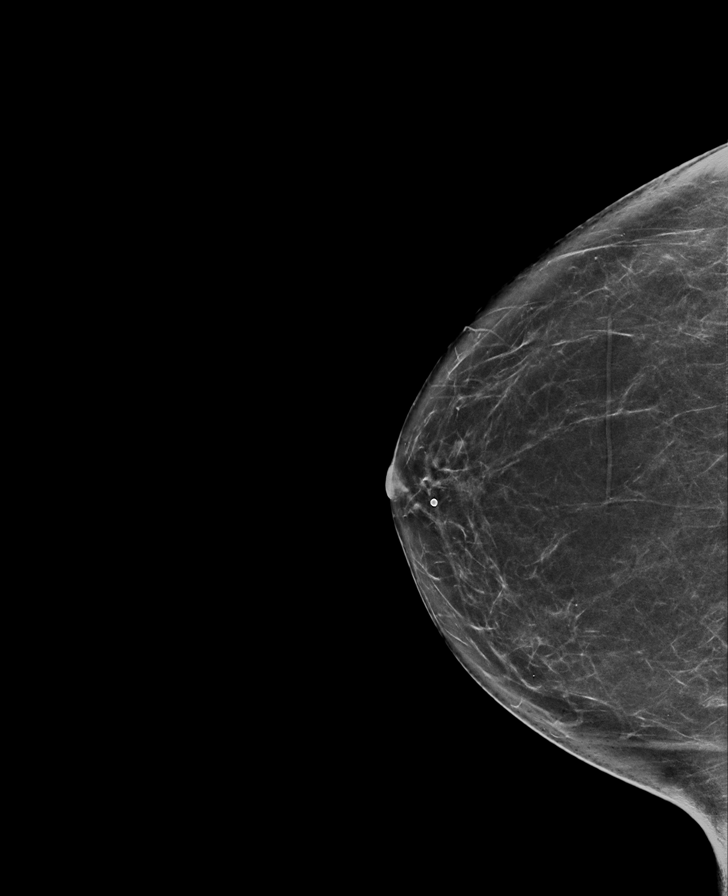

[L MLO synth-2D]
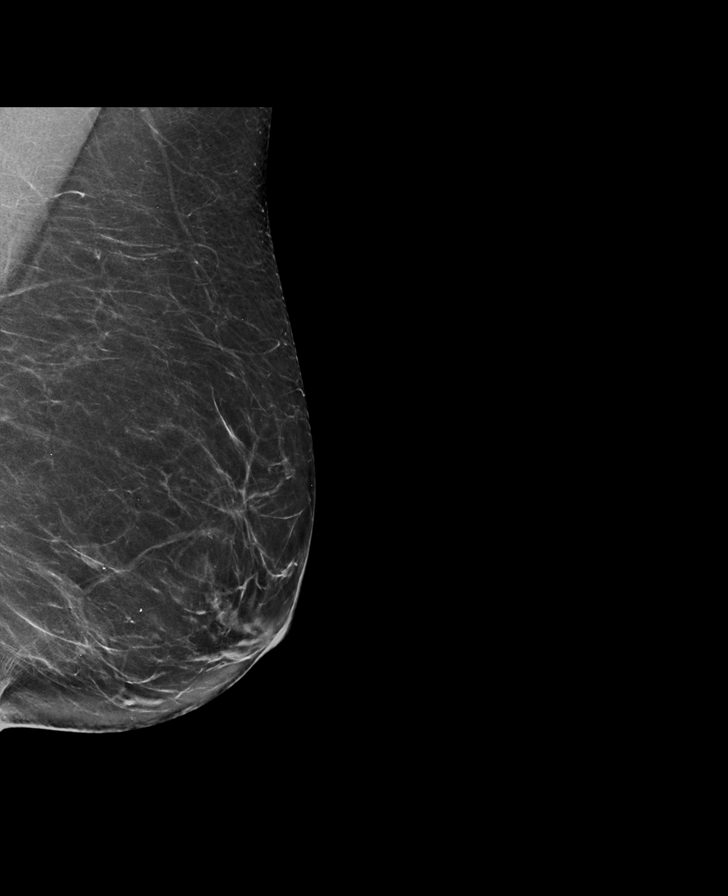

[L CC synth-2D]
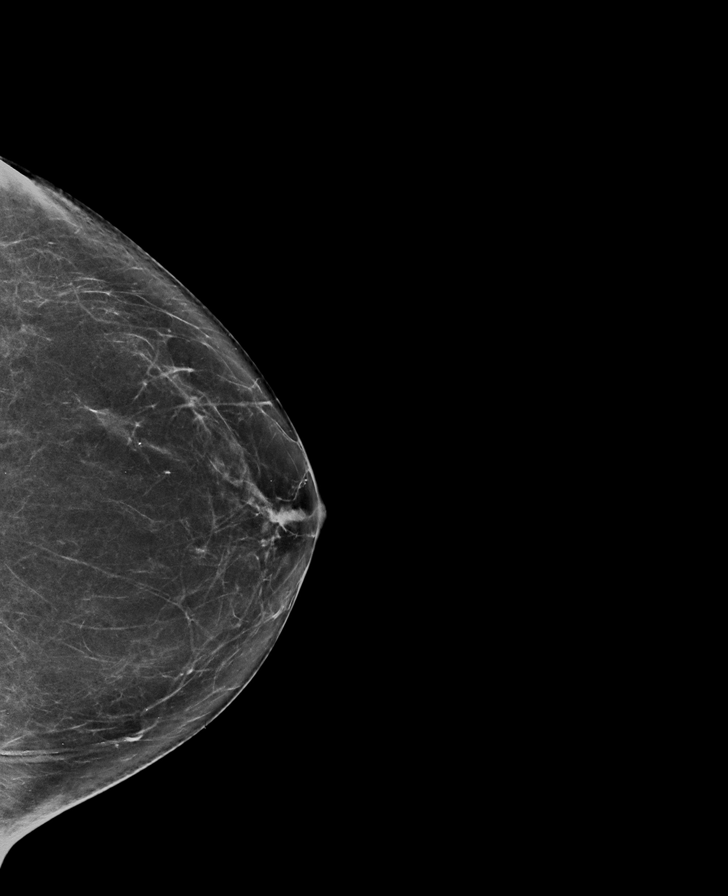

[R MLO synth-2D]
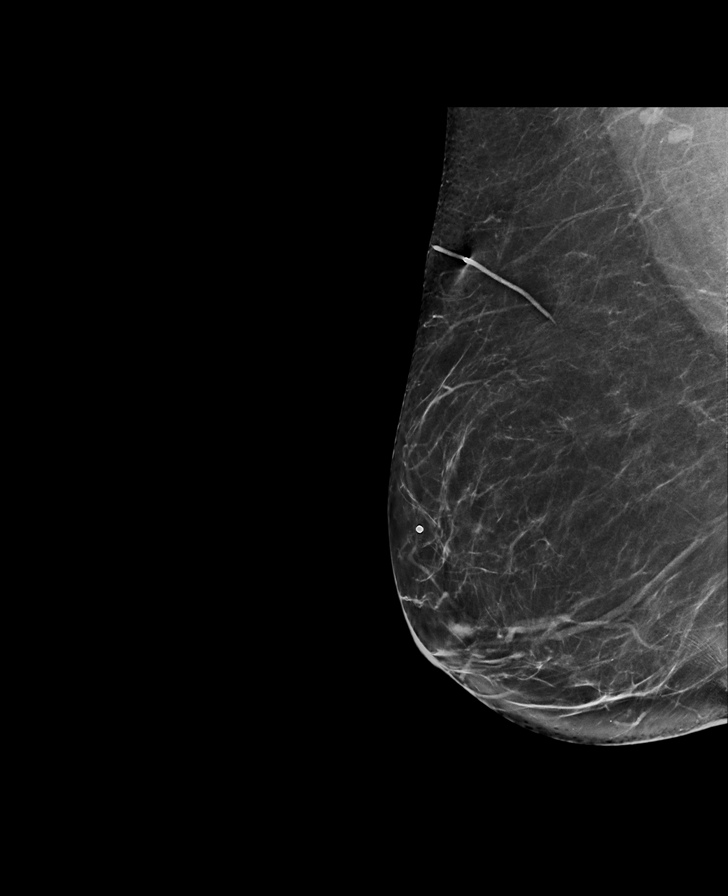

[L CC tomo · tomo slice 35/69.0]
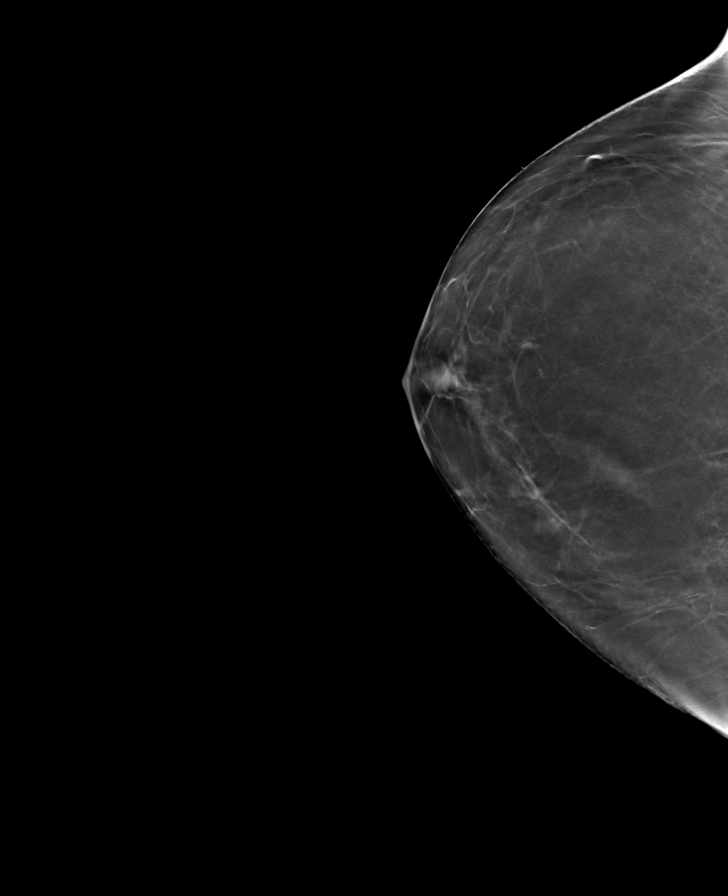

[R MLO tomo · tomo slice 38/75.0]
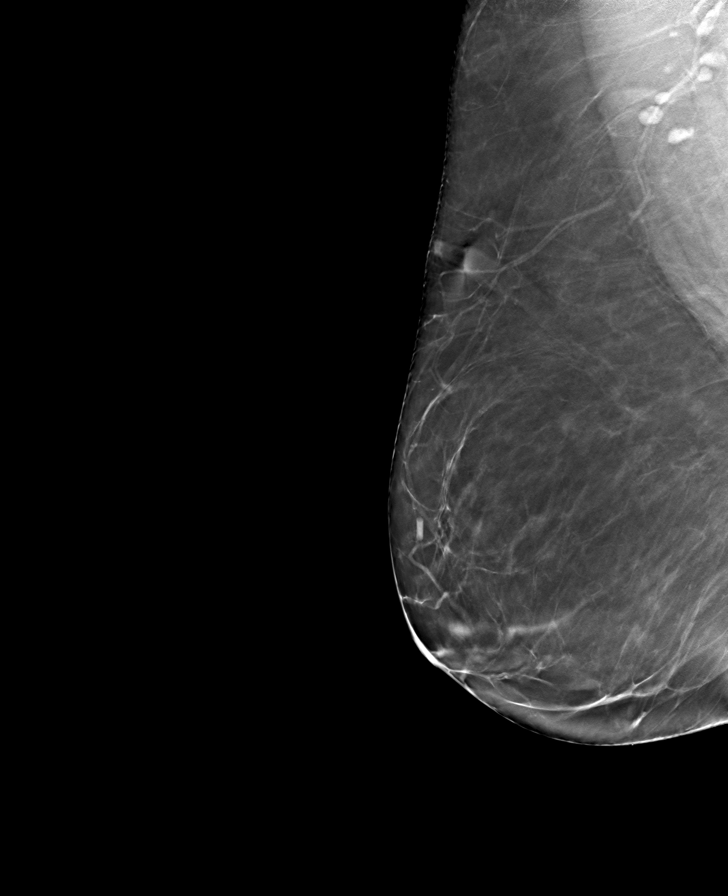

[L MLO tomo · tomo slice 39/76.0]
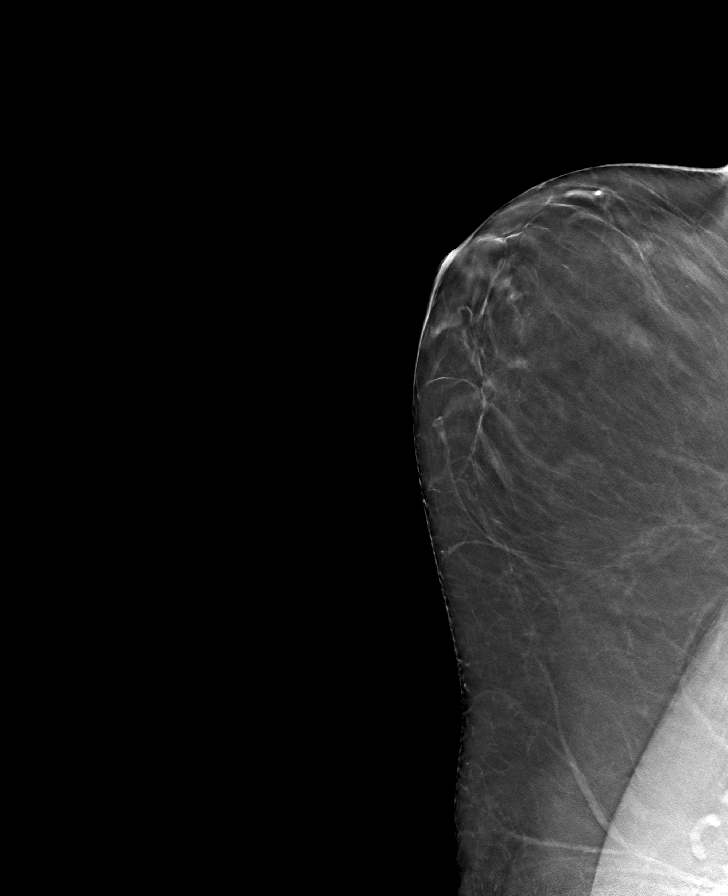

[R CC tomo · tomo slice 35/68.0]
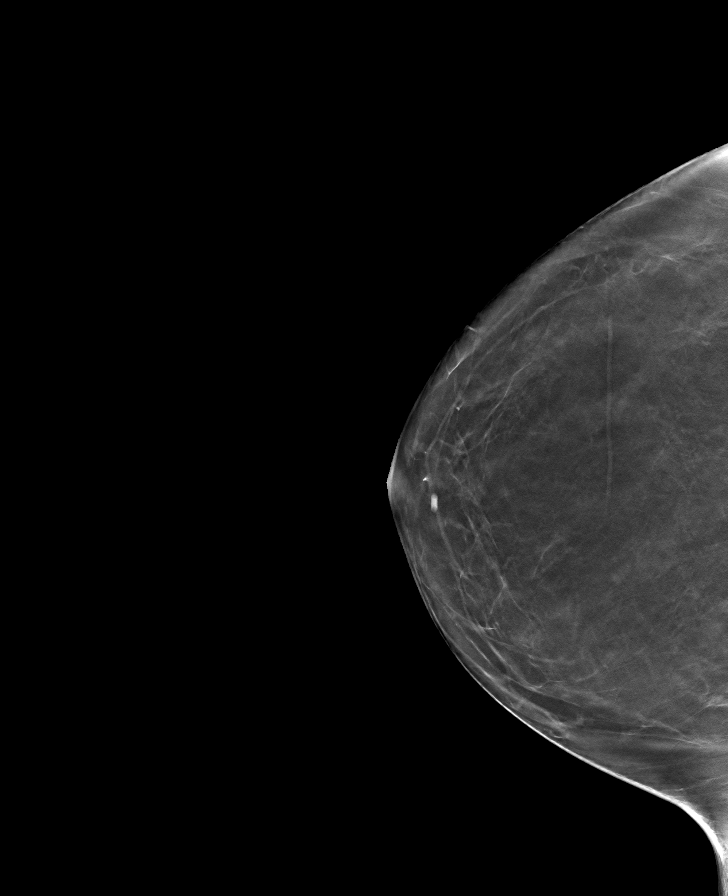

[8 of 24 positions shown; findings below may reference images not displayed]

FINDINGS: There are no findings suspicious for malignancy. The images were
evaluated with computer-aided detection.
IMPRESSION: No mammographic evidence of malignancy. A result letter of this
screening mammogram will be mailed directly to the patient.

RECOMMENDATION:
Screening mammogram in one year. (Code:[id number]-[id number])

BI-RADS CATEGORY  1: Negative.

## 2021-07-09 DIAGNOSIS — J3081 Allergic rhinitis due to animal (cat) (dog) hair and dander: Secondary | ICD-10-CM | POA: Diagnosis not present

## 2021-07-09 DIAGNOSIS — M5023 Other cervical disc displacement, cervicothoracic region: Secondary | ICD-10-CM | POA: Diagnosis not present

## 2021-07-09 DIAGNOSIS — J3089 Other allergic rhinitis: Secondary | ICD-10-CM | POA: Diagnosis not present

## 2021-07-09 DIAGNOSIS — M545 Low back pain, unspecified: Secondary | ICD-10-CM | POA: Diagnosis not present

## 2021-07-09 DIAGNOSIS — M5416 Radiculopathy, lumbar region: Secondary | ICD-10-CM | POA: Diagnosis not present

## 2021-07-09 DIAGNOSIS — J301 Allergic rhinitis due to pollen: Secondary | ICD-10-CM | POA: Diagnosis not present

## 2021-07-16 DIAGNOSIS — J301 Allergic rhinitis due to pollen: Secondary | ICD-10-CM | POA: Diagnosis not present

## 2021-07-16 DIAGNOSIS — J3089 Other allergic rhinitis: Secondary | ICD-10-CM | POA: Diagnosis not present

## 2021-07-16 DIAGNOSIS — J3081 Allergic rhinitis due to animal (cat) (dog) hair and dander: Secondary | ICD-10-CM | POA: Diagnosis not present

## 2021-07-22 DIAGNOSIS — J4540 Moderate persistent asthma, uncomplicated: Secondary | ICD-10-CM | POA: Diagnosis not present

## 2021-07-25 DIAGNOSIS — M5386 Other specified dorsopathies, lumbar region: Secondary | ICD-10-CM | POA: Diagnosis not present

## 2021-07-25 DIAGNOSIS — M961 Postlaminectomy syndrome, not elsewhere classified: Secondary | ICD-10-CM | POA: Diagnosis not present

## 2021-07-25 DIAGNOSIS — G894 Chronic pain syndrome: Secondary | ICD-10-CM | POA: Diagnosis not present

## 2021-07-26 ENCOUNTER — Other Ambulatory Visit: Payer: Self-pay | Admitting: Family Medicine

## 2021-07-26 DIAGNOSIS — F411 Generalized anxiety disorder: Secondary | ICD-10-CM

## 2021-07-26 DIAGNOSIS — M797 Fibromyalgia: Secondary | ICD-10-CM

## 2021-07-29 DIAGNOSIS — J3089 Other allergic rhinitis: Secondary | ICD-10-CM | POA: Diagnosis not present

## 2021-07-29 DIAGNOSIS — J3081 Allergic rhinitis due to animal (cat) (dog) hair and dander: Secondary | ICD-10-CM | POA: Diagnosis not present

## 2021-07-29 DIAGNOSIS — J301 Allergic rhinitis due to pollen: Secondary | ICD-10-CM | POA: Diagnosis not present

## 2021-08-04 ENCOUNTER — Ambulatory Visit: Payer: Medicare PPO | Admitting: Podiatry

## 2021-08-04 ENCOUNTER — Other Ambulatory Visit: Payer: Self-pay

## 2021-08-04 ENCOUNTER — Encounter: Payer: Self-pay | Admitting: Podiatry

## 2021-08-04 DIAGNOSIS — M79675 Pain in left toe(s): Secondary | ICD-10-CM

## 2021-08-04 DIAGNOSIS — B351 Tinea unguium: Secondary | ICD-10-CM | POA: Diagnosis not present

## 2021-08-04 DIAGNOSIS — M79674 Pain in right toe(s): Secondary | ICD-10-CM

## 2021-08-06 NOTE — Progress Notes (Signed)
Subjective: 70 year old female presents the office they for concerns of elongated, thick toenails that are hard for her to trim and for a callus on the left foot.  The callus has been doing better. No opening, drainage, swelling or any signs of infection. Denies any redness or drainage or any swelling to the callus or toenail site.  She has no other concerns.    Objective: AAO x3, NAD DP/PT pulses palpable bilaterally, CRT less than 3 seconds Nails are hypertrophic, dystrophic, brittle, discolored, elongated 10. No surrounding redness or drainage. Tenderness nails 1-5 bilaterally. No open lesions or pre-ulcerative lesions are identified today. Minimal hyperkeratotic tissue is present left foot medial hallux and left submetatarsal area.  There is no ongoing ulceration drainage or signs of infection. Hammertoes present bilaterally No pain with calf compression, swelling, warmth, erythema No acute changes otherwsie  Assessment: Symptomatic onychomycosis, hyperkeratotic lesion  Plan: -All treatment options discussed with the patient including all alternatives, risks, complications.  -Nails sharply debrided x 10 without complications or bleeding.  -Hyperkeratotic lesions sharply debrided x 1 without any complications or bleeding as a courtesy as the calluses were minimal.   -Continue moisturizer and supportive shoes -Daily foot inspection  Celesta Gentile, DPM

## 2021-08-13 DIAGNOSIS — J3089 Other allergic rhinitis: Secondary | ICD-10-CM | POA: Diagnosis not present

## 2021-08-13 DIAGNOSIS — J301 Allergic rhinitis due to pollen: Secondary | ICD-10-CM | POA: Diagnosis not present

## 2021-08-13 DIAGNOSIS — J3081 Allergic rhinitis due to animal (cat) (dog) hair and dander: Secondary | ICD-10-CM | POA: Diagnosis not present

## 2021-08-20 DIAGNOSIS — J4540 Moderate persistent asthma, uncomplicated: Secondary | ICD-10-CM | POA: Diagnosis not present

## 2021-08-20 DIAGNOSIS — R030 Elevated blood-pressure reading, without diagnosis of hypertension: Secondary | ICD-10-CM | POA: Diagnosis not present

## 2021-08-20 DIAGNOSIS — Z9689 Presence of other specified functional implants: Secondary | ICD-10-CM | POA: Diagnosis not present

## 2021-08-20 DIAGNOSIS — Z6831 Body mass index (BMI) 31.0-31.9, adult: Secondary | ICD-10-CM | POA: Diagnosis not present

## 2021-08-27 DIAGNOSIS — J3081 Allergic rhinitis due to animal (cat) (dog) hair and dander: Secondary | ICD-10-CM | POA: Diagnosis not present

## 2021-08-27 DIAGNOSIS — J3089 Other allergic rhinitis: Secondary | ICD-10-CM | POA: Diagnosis not present

## 2021-08-27 DIAGNOSIS — J301 Allergic rhinitis due to pollen: Secondary | ICD-10-CM | POA: Diagnosis not present

## 2021-09-08 ENCOUNTER — Encounter: Payer: Self-pay | Admitting: Family Medicine

## 2021-09-09 MED ORDER — TRIAMTERENE-HCTZ 37.5-25 MG PO CAPS: 1.0000 | ORAL_CAPSULE | Freq: Every day | ORAL | 3 refills | Status: DC

## 2021-09-10 ENCOUNTER — Other Ambulatory Visit: Payer: Self-pay | Admitting: Family Medicine

## 2021-09-10 DIAGNOSIS — H25813 Combined forms of age-related cataract, bilateral: Secondary | ICD-10-CM | POA: Diagnosis not present

## 2021-09-11 DIAGNOSIS — J301 Allergic rhinitis due to pollen: Secondary | ICD-10-CM | POA: Diagnosis not present

## 2021-09-11 DIAGNOSIS — J3089 Other allergic rhinitis: Secondary | ICD-10-CM | POA: Diagnosis not present

## 2021-09-11 DIAGNOSIS — J3081 Allergic rhinitis due to animal (cat) (dog) hair and dander: Secondary | ICD-10-CM | POA: Diagnosis not present

## 2021-09-17 DIAGNOSIS — J4540 Moderate persistent asthma, uncomplicated: Secondary | ICD-10-CM | POA: Diagnosis not present

## 2021-09-23 DIAGNOSIS — L579 Skin changes due to chronic exposure to nonionizing radiation, unspecified: Secondary | ICD-10-CM | POA: Diagnosis not present

## 2021-09-23 DIAGNOSIS — L821 Other seborrheic keratosis: Secondary | ICD-10-CM | POA: Diagnosis not present

## 2021-09-23 DIAGNOSIS — L570 Actinic keratosis: Secondary | ICD-10-CM | POA: Diagnosis not present

## 2021-09-23 DIAGNOSIS — Z85828 Personal history of other malignant neoplasm of skin: Secondary | ICD-10-CM | POA: Diagnosis not present

## 2021-09-23 DIAGNOSIS — L814 Other melanin hyperpigmentation: Secondary | ICD-10-CM | POA: Diagnosis not present

## 2021-09-24 ENCOUNTER — Ambulatory Visit: Payer: Medicare PPO | Admitting: Family Medicine

## 2021-09-24 ENCOUNTER — Encounter: Payer: Self-pay | Admitting: Family Medicine

## 2021-09-24 VITALS — BP 120/70 | HR 75 | Temp 97.7°F | Ht 64.0 in | Wt 187.2 lb

## 2021-09-24 DIAGNOSIS — M79641 Pain in right hand: Secondary | ICD-10-CM | POA: Diagnosis not present

## 2021-09-24 DIAGNOSIS — J301 Allergic rhinitis due to pollen: Secondary | ICD-10-CM | POA: Diagnosis not present

## 2021-09-24 DIAGNOSIS — F411 Generalized anxiety disorder: Secondary | ICD-10-CM

## 2021-09-24 DIAGNOSIS — M797 Fibromyalgia: Secondary | ICD-10-CM | POA: Diagnosis not present

## 2021-09-24 DIAGNOSIS — M79642 Pain in left hand: Secondary | ICD-10-CM | POA: Diagnosis not present

## 2021-09-24 DIAGNOSIS — F3341 Major depressive disorder, recurrent, in partial remission: Secondary | ICD-10-CM | POA: Diagnosis not present

## 2021-09-24 DIAGNOSIS — J3081 Allergic rhinitis due to animal (cat) (dog) hair and dander: Secondary | ICD-10-CM | POA: Diagnosis not present

## 2021-09-24 DIAGNOSIS — J3089 Other allergic rhinitis: Secondary | ICD-10-CM | POA: Diagnosis not present

## 2021-09-24 DIAGNOSIS — I10 Essential (primary) hypertension: Secondary | ICD-10-CM | POA: Diagnosis not present

## 2021-09-24 MED ORDER — PREGABALIN 150 MG PO CAPS: ORAL_CAPSULE | ORAL | 1 refills | Status: DC

## 2021-09-24 NOTE — Progress Notes (Signed)
Chief Complaint  Patient presents with   Follow-up    6 month Refill for Lyrica    Subjective Lisa Morrison is a 71 y.o. female who presents for hypertension follow up. She does not monitor home blood pressures. She is compliant with medication- Triamterene-HCTZ 37.5-25 mg/d Patient has these side effects of medication: none She is sometimes adhering to a healthy diet overall. Current exercise: none No Cp or SOB.  Dep/anxiety: Has been taking nortriptyline 10 mg qhs. Has been more stressed out lately due to son going thru divorce. Not following with a counselor or psychologist.   FM- taking Lyrica 150 mg qhs. Doing well. No AE's, compliant.   Hand pain- swelling and pain of hands, worse and most stiff in AM. Mom had RA. Never been tested. No rashes.    Past Medical History:  Diagnosis Date   Anxiety    Arthritis    Back pain    CFS (chronic fatigue syndrome)    Depression    denies now   Dyspnea    Edema    due to taking lyrica   Family history of adverse reaction to anesthesia    son had n/v   Fibromyalgia    IBS (irritable bowel syndrome)    Leaky heart valve    Migraine    Orthostatic hypotension    from a  previous surgery    PONV (postoperative nausea and vomiting)    and heart rate goes down " I fainted after surgery when standing in the restroom."   Seasonal allergies    Spondylolisthesis of lumbar region    Wears glasses     Exam BP 120/70    Pulse 75    Temp 97.7 F (36.5 C) (Oral)    Ht 5\' 4"  (1.626 m)    Wt 187 lb 4 oz (84.9 kg)    SpO2 98%    BMI 32.14 kg/m  General:  well developed, well nourished, in no apparent distress Heart: RRR, no bruits, no LE edema Lungs: clear to auscultation, no accessory muscle use Psych: well oriented with normal range of affect and appropriate judgment/insight  Essential hypertension  GAD (generalized anxiety disorder)  Recurrent major depressive disorder, in partial remission (HCC)  Fibromyalgia  Pain in both  hands - Plan: Antinuclear Antib (ANA), Anti-Smith antibody, Rheumatoid Factor, Cyclic citrul peptide antibody, IgG (QUEST), Anti-DNA antibody, double-stranded  Chronic, stable. Cont Dyazide 37.5-25  mg/d. Counseled on diet and exercise. 2/3. Chronic, unstable. Does not wish to change meds. Counseling resources provided in AVS. 4.   Chronic, stable. Cont Lyrica 150 mg qhs. Counseled on exercise.  5.  Ck for SLE and RA. +famhx and b/l hand pain.  F/u in 6 mo or prn. The patient voiced understanding and agreement to the plan.  Gary, DO 09/24/21  9:12 AM

## 2021-09-24 NOTE — Patient Instructions (Addendum)
Keep the diet clean and stay active.  Give [place] 4-5 business days to get the results of your labs back.   Foods that may reduce pain: 1) Ginger 2) Blueberries 3) Salmon 4) Pumpkin seeds 5) dark chocolate 6) turmeric 7) tart cherries 8) virgin olive oil 9) chilli peppers 10) mint 11) red wine  Please consider counseling. Contact [phone] to schedule an appointment or inquire about cost/insurance coverage.  [facility] located at [place], [place], [place].  Phone number = [phone].  Dr. Lennice Sites - Adult Psychiatry.    [facility] located at [place], [place], [place]. Phone number = [phone].   [facility] located at [address], [place], [place].  Phone number = [phone].   [facility] located at [place], [place], [place].  Phone number = [phone].  Let us know if you need anything.4-5

## 2021-09-27 LAB — ANTI-DNA ANTIBODY, DOUBLE-STRANDED: ds DNA Ab: 2 IU/mL

## 2021-09-27 LAB — CYCLIC CITRUL PEPTIDE ANTIBODY, IGG: Cyclic Citrullin Peptide Ab: <16 UNITS

## 2021-09-27 LAB — ANTI-NUCLEAR AB-TITER (ANA TITER): ANA Titer 1: 1:640 {titer} — ABNORMAL HIGH

## 2021-09-27 LAB — ANA: Anti Nuclear Antibody (ANA): POSITIVE — AB

## 2021-09-27 LAB — ANTI-SMITH ANTIBODY: ENA SM Ab Ser-aCnc: <1 AI

## 2021-09-27 LAB — RHEUMATOID FACTOR: Rheumatoid fact SerPl-aCnc: <14 IU/mL (ref <14)

## 2021-09-28 ENCOUNTER — Encounter: Payer: Self-pay | Admitting: Family Medicine

## 2021-09-29 ENCOUNTER — Other Ambulatory Visit: Payer: Self-pay | Admitting: Family Medicine

## 2021-09-29 ENCOUNTER — Encounter: Payer: Self-pay | Admitting: Family Medicine

## 2021-09-29 DIAGNOSIS — G43109 Migraine with aura, not intractable, without status migrainosus: Secondary | ICD-10-CM

## 2021-09-29 DIAGNOSIS — M79641 Pain in right hand: Secondary | ICD-10-CM

## 2021-09-29 DIAGNOSIS — R768 Other specified abnormal immunological findings in serum: Secondary | ICD-10-CM

## 2021-09-29 DIAGNOSIS — M79642 Pain in left hand: Secondary | ICD-10-CM

## 2021-10-02 ENCOUNTER — Other Ambulatory Visit: Payer: Self-pay | Admitting: Family Medicine

## 2021-10-02 MED ORDER — BUPROPION HCL ER (SR) 150 MG PO TB12: 150.0000 mg | ORAL_TABLET | Freq: Two times a day (BID) | ORAL | 1 refills | Status: DC

## 2021-10-07 ENCOUNTER — Encounter: Payer: Self-pay | Admitting: Family Medicine

## 2021-10-07 MED ORDER — BUPROPION HCL ER (SR) 150 MG PO TB12: 150.0000 mg | ORAL_TABLET | Freq: Two times a day (BID) | ORAL | 1 refills | Status: DC

## 2021-10-08 DIAGNOSIS — J3081 Allergic rhinitis due to animal (cat) (dog) hair and dander: Secondary | ICD-10-CM | POA: Diagnosis not present

## 2021-10-08 DIAGNOSIS — J301 Allergic rhinitis due to pollen: Secondary | ICD-10-CM | POA: Diagnosis not present

## 2021-10-08 DIAGNOSIS — J3089 Other allergic rhinitis: Secondary | ICD-10-CM | POA: Diagnosis not present

## 2021-10-15 DIAGNOSIS — Z9689 Presence of other specified functional implants: Secondary | ICD-10-CM | POA: Diagnosis not present

## 2021-10-15 DIAGNOSIS — Z6831 Body mass index (BMI) 31.0-31.9, adult: Secondary | ICD-10-CM | POA: Diagnosis not present

## 2021-10-15 DIAGNOSIS — I10 Essential (primary) hypertension: Secondary | ICD-10-CM | POA: Diagnosis not present

## 2021-10-16 DIAGNOSIS — J4540 Moderate persistent asthma, uncomplicated: Secondary | ICD-10-CM | POA: Diagnosis not present

## 2021-10-22 DIAGNOSIS — J3081 Allergic rhinitis due to animal (cat) (dog) hair and dander: Secondary | ICD-10-CM | POA: Diagnosis not present

## 2021-10-22 DIAGNOSIS — J3089 Other allergic rhinitis: Secondary | ICD-10-CM | POA: Diagnosis not present

## 2021-10-22 DIAGNOSIS — J301 Allergic rhinitis due to pollen: Secondary | ICD-10-CM | POA: Diagnosis not present

## 2021-11-04 ENCOUNTER — Other Ambulatory Visit: Payer: Self-pay

## 2021-11-04 ENCOUNTER — Ambulatory Visit: Payer: Medicare PPO | Admitting: Podiatry

## 2021-11-04 DIAGNOSIS — M79674 Pain in right toe(s): Secondary | ICD-10-CM

## 2021-11-04 DIAGNOSIS — M79675 Pain in left toe(s): Secondary | ICD-10-CM

## 2021-11-04 DIAGNOSIS — B351 Tinea unguium: Secondary | ICD-10-CM

## 2021-11-04 DIAGNOSIS — Q828 Other specified congenital malformations of skin: Secondary | ICD-10-CM

## 2021-11-04 NOTE — Progress Notes (Signed)
Subjective: 71 year old female presents the office they for concerns of elongated, thick toenails that are hard for her to trim and for a callus on the left foot.  The callus has not been causing much discomfort and is minimal.  No open sores.  No swelling or redness.  No other concerns.  No fevers or chills.   Objective: AAO x3, NAD DP/PT pulses palpable bilaterally, CRT less than 3 seconds Nails are hypertrophic, dystrophic, brittle, discolored, elongated 10. No surrounding redness or drainage. Tenderness nails 1-5 bilaterally. No open lesions or pre-ulcerative lesions are identified today. Very minimal hyperkeratotic tissue is present left foot medial hallux and left submetatarsal area.  There is no ongoing ulceration drainage or signs of infection. Hammertoes present bilaterally No pain with calf compression, swelling, warmth, erythema No acute changes otherwsie  Assessment: Symptomatic onychomycosis, hyperkeratotic lesion  Plan: -All treatment options discussed with the patient including all alternatives, risks, complications.  -Nails sharply debrided x 10 without complications or bleeding.  -Hyperkeratotic lesions sharply debrided x 1 without any complications or bleeding as a courtesy as the calluses were minimal.   -Continue moisturizer and supportive shoes -Daily foot inspection  Celesta Gentile, DPM

## 2021-11-05 DIAGNOSIS — J301 Allergic rhinitis due to pollen: Secondary | ICD-10-CM | POA: Diagnosis not present

## 2021-11-05 DIAGNOSIS — J3089 Other allergic rhinitis: Secondary | ICD-10-CM | POA: Diagnosis not present

## 2021-11-05 DIAGNOSIS — J3081 Allergic rhinitis due to animal (cat) (dog) hair and dander: Secondary | ICD-10-CM | POA: Diagnosis not present

## 2021-11-17 DIAGNOSIS — J4540 Moderate persistent asthma, uncomplicated: Secondary | ICD-10-CM | POA: Diagnosis not present

## 2021-11-19 DIAGNOSIS — J3081 Allergic rhinitis due to animal (cat) (dog) hair and dander: Secondary | ICD-10-CM | POA: Diagnosis not present

## 2021-11-19 DIAGNOSIS — J3089 Other allergic rhinitis: Secondary | ICD-10-CM | POA: Diagnosis not present

## 2021-11-19 DIAGNOSIS — J301 Allergic rhinitis due to pollen: Secondary | ICD-10-CM | POA: Diagnosis not present

## 2021-11-27 DIAGNOSIS — J4540 Moderate persistent asthma, uncomplicated: Secondary | ICD-10-CM | POA: Diagnosis not present

## 2021-11-27 DIAGNOSIS — J301 Allergic rhinitis due to pollen: Secondary | ICD-10-CM | POA: Diagnosis not present

## 2021-11-27 DIAGNOSIS — R0609 Other forms of dyspnea: Secondary | ICD-10-CM | POA: Diagnosis not present

## 2021-11-27 DIAGNOSIS — J3089 Other allergic rhinitis: Secondary | ICD-10-CM | POA: Diagnosis not present

## 2021-11-27 DIAGNOSIS — J3081 Allergic rhinitis due to animal (cat) (dog) hair and dander: Secondary | ICD-10-CM | POA: Diagnosis not present

## 2021-12-03 DIAGNOSIS — J3081 Allergic rhinitis due to animal (cat) (dog) hair and dander: Secondary | ICD-10-CM | POA: Diagnosis not present

## 2021-12-03 DIAGNOSIS — J301 Allergic rhinitis due to pollen: Secondary | ICD-10-CM | POA: Diagnosis not present

## 2021-12-03 DIAGNOSIS — J3089 Other allergic rhinitis: Secondary | ICD-10-CM | POA: Diagnosis not present

## 2021-12-10 DIAGNOSIS — J3089 Other allergic rhinitis: Secondary | ICD-10-CM | POA: Diagnosis not present

## 2021-12-10 DIAGNOSIS — J301 Allergic rhinitis due to pollen: Secondary | ICD-10-CM | POA: Diagnosis not present

## 2021-12-10 DIAGNOSIS — J3081 Allergic rhinitis due to animal (cat) (dog) hair and dander: Secondary | ICD-10-CM | POA: Diagnosis not present

## 2021-12-15 DIAGNOSIS — J4540 Moderate persistent asthma, uncomplicated: Secondary | ICD-10-CM | POA: Diagnosis not present

## 2021-12-16 ENCOUNTER — Ambulatory Visit: Payer: Medicare PPO | Admitting: Rheumatology

## 2021-12-17 DIAGNOSIS — J3089 Other allergic rhinitis: Secondary | ICD-10-CM | POA: Diagnosis not present

## 2021-12-17 DIAGNOSIS — J3081 Allergic rhinitis due to animal (cat) (dog) hair and dander: Secondary | ICD-10-CM | POA: Diagnosis not present

## 2021-12-17 DIAGNOSIS — J301 Allergic rhinitis due to pollen: Secondary | ICD-10-CM | POA: Diagnosis not present

## 2021-12-22 ENCOUNTER — Other Ambulatory Visit: Payer: Self-pay | Admitting: Family Medicine

## 2021-12-24 ENCOUNTER — Encounter: Payer: Self-pay | Admitting: Family Medicine

## 2021-12-24 ENCOUNTER — Ambulatory Visit: Payer: Medicare PPO | Admitting: Family Medicine

## 2021-12-24 VITALS — BP 110/70 | HR 84 | Temp 97.9°F | Ht 64.0 in | Wt 189.0 lb

## 2021-12-24 DIAGNOSIS — R350 Frequency of micturition: Secondary | ICD-10-CM

## 2021-12-24 LAB — POC URINALSYSI DIPSTICK (AUTOMATED)
Bilirubin, UA: NEGATIVE
Blood, UA: NEGATIVE
Glucose, UA: NEGATIVE
Protein, UA: POSITIVE — AB
Spec Grav, UA: >=1.03 — AB (ref 1.010–1.025)
Urobilinogen, UA: 0.2 E.U./dL
pH, UA: 5 (ref 5.0–8.0)

## 2021-12-24 MED ORDER — NITROFURANTOIN MONOHYD MACRO 100 MG PO CAPS: 100.0000 mg | ORAL_CAPSULE | Freq: Two times a day (BID) | ORAL | 0 refills | Status: AC

## 2021-12-24 NOTE — Progress Notes (Signed)
Chief Complaint  ?Patient presents with  ? Fatigue  ?  For 2 weeks ?  ? Cough  ?  Allergies ?  ? Urinary Frequency  ? ? ?Lisa Morrison is a 71 y.o. female here for possible UTI. ? ?Duration: 7 days. ?Symptoms: Dysuria, urinary frequency, urinary retention, and urgency ?Denies: hematuria, urinary hesitancy, flank pain, fever, nausea, and vomiting, vaginal discharge ?Hx of recurrent UTI? No ?Denies new sexual partners. ? ?Past Medical History:  ?Diagnosis Date  ? Anxiety   ? Arthritis   ? Back pain   ? CFS (chronic fatigue syndrome)   ? Depression   ? denies now  ? Dyspnea   ? Edema   ? due to taking lyrica  ? Family history of adverse reaction to anesthesia   ? son had n/v  ? Fibromyalgia   ? IBS (irritable bowel syndrome)   ? Leaky heart valve   ? Migraine   ? Orthostatic hypotension   ? from a  previous surgery   ? PONV (postoperative nausea and vomiting)   ? and heart rate goes down " I fainted after surgery when standing in the restroom."  ? Seasonal allergies   ? Spondylolisthesis of lumbar region   ? Wears glasses   ?  ? ?BP 110/70   Pulse 84   Temp 97.9 ?F (36.6 ?C)   Ht '5\' 4"'$  (1.626 m)   Wt 189 lb (85.7 kg)   SpO2 99%   BMI 32.44 kg/m?  ?General: Awake, alert, appears stated age ?Heart: RRR ?Lungs: CTAB, normal respiratory effort, no accessory muscle usage ?Abd: BS+, soft, NT, ND, no masses or organomegaly ?MSK: No CVA tenderness, neg Lloyd's sign ?Psych: Age appropriate judgment and insight ? ?Frequent urination - Plan: POCT Urinalysis Dipstick (Automated), Urine Culture, nitrofurantoin, macrocrystal-monohydrate, (MACROBID) 100 MG capsule ? ?Macrobid 100 mg twice daily for 5 days, check culture.  UA with trace leukocyte esterase.  Symptoms were concerning for infection. Stay hydrated.  Seek immediate care if pt starts to develop fevers, new/worsening symptoms, uncontrollable N/V. ?F/u prn. ?The patient voiced understanding and agreement to the plan. ? ?Shelda Pal, DO ?12/24/21 ?11:40 AM ? ?

## 2021-12-24 NOTE — Patient Instructions (Signed)
Stay hydrated.   Warning signs/symptoms: Uncontrollable nausea/vomiting, fevers, worsening symptoms despite treatment, confusion.  Give us around 2 business days to get culture back to you.  Let us know if you need anything. 

## 2021-12-25 LAB — URINE CULTURE
MICRO NUMBER:: 13284379
SPECIMEN QUALITY:: ADEQUATE

## 2021-12-25 NOTE — Progress Notes (Signed)
? ?Office Visit Note ? ?Patient: Lisa Morrison             ?Date of Birth: [birth year]           ?MRN: [id number]             ?PCP: Shelda Pal, DO ?Referring: Shelda Pal* ?Visit Date: 01/07/2022 ?Occupation: '@GUAROCC'$ @ ? ?Subjective:  ?Pain in multiple joints, positive ANA ? ?History of Present Illness: Lisa Morrison is a 71 y.o. female seen in consultation per request of her PCP.  According the patient she was diagnosed with degenerative disc disease of lumbar spine with a spinal stenosis about 10 years ago.  She states the symptoms gradually got worse and she had lumbar spine fusion by Dr. Sherley Bounds.  She states she had good response to the surgery but gradually the pain came back.  She underwent lumbar spine stim unit placement in November 2022.  She states the stim unit helped with the lower back pain but after that she started having pain in her lower extremities.  She describes pain below her knee joints in her bilateral legs.  She also has known history of arthritis in her knee joints.  She states recently she has been having increased pain in her right knee joint.  Notices swelling in her right knee joint.  She has been also experiencing pain and discomfort in her bilateral hands since the stim unit placement.  She states she has noticed swelling in her hands and difficulty making a fist and decreased grip strength.  She also describes some discomfort over the right trochanteric bursa.  None of the other joints are painful.  She has longstanding history of fibromyalgia for more than 20 years.  She has been on Lyrica for many years which has been helpful.  She continues to have fatigue.  There is no history of oral ulcers, nasal ulcers, malar rash, photosensitivity, lymphadenopathy.  She gives history of dry mouth and raynaud's phenominon.  There is family history of rheumatoid arthritis and a second cousin.  Is gravida 2, para 2.  There is no history of DVTs. ? ?Activities of Daily  Living:  ?Patient reports morning stiffness for 2-3 hours.   ?Patient Denies nocturnal pain.  ?Difficulty dressing/grooming: Denies ?Difficulty climbing stairs: Reports ?Difficulty getting out of chair: Reports ?Difficulty using hands for taps, buttons, cutlery, and/or writing: Reports ? ?Review of Systems  ?Constitutional:  Positive for fatigue.  ?HENT:  Positive for mouth dryness and nose dryness. Negative for mouth sores.   ?Eyes:  Negative for pain, itching and dryness.  ?Respiratory:  Positive for shortness of breath. Negative for difficulty breathing.   ?Cardiovascular:  Negative for chest pain and palpitations.  ?Gastrointestinal:  Positive for constipation. Negative for blood in stool and diarrhea.  ?Endocrine: Positive for increased urination.  ?Genitourinary:  Negative for difficulty urinating.  ?Musculoskeletal:  Positive for joint pain, joint pain, joint swelling, myalgias, morning stiffness, muscle tenderness and myalgias.  ?Skin:  Positive for color change. Negative for rash, redness and sensitivity to sunlight.  ?Allergic/Immunologic: Negative for susceptible to infections.  ?Neurological:  Positive for numbness and headaches. Negative for dizziness and memory loss.  ?Hematological:  Positive for bruising/bleeding tendency.  ?Psychiatric/Behavioral:  Positive for depressed mood and sleep disturbance. Negative for confusion. The patient is nervous/anxious.   ? ?PMFS History:  ?Patient Active Problem List  ? Diagnosis Date Noted  ? Essential hypertension 06/10/2020  ? GAD (generalized anxiety disorder) 06/10/2020  ? Allergies  04/09/2020  ? Elevated blood-pressure reading, without diagnosis of hypertension 02/15/2020  ? Body mass index (BMI) 29.0-29.9, adult 11/07/2019  ? S/P lumbar fusion 03/20/2019  ? Lumbar facet arthropathy 10/27/2018  ? Asbestos exposure 09/30/2016  ? DDD (degenerative disc disease), lumbar 09/30/2016  ? Hyperglycemia 09/30/2016  ? Mixed hyperlipidemia 09/30/2016  ? Fibromyalgia  04/27/2016  ? Anxiety 04/01/2016  ? Arthritis of left knee 04/01/2016  ? Chronic fatigue 04/01/2016  ? Irritable bowel syndrome with constipation 04/01/2016  ? Lumbar stenosis 04/01/2016  ? Recurrent major depressive disorder ([place]) 04/01/2016  ? Chest pain syndrome 03/31/2016  ? Palpitations 03/31/2016  ? Shortness of breath 03/31/2016  ?  ?Past Medical History:  ?Diagnosis Date  ? Anxiety   ? Arthritis   ? Back pain   ? CFS (chronic fatigue syndrome)   ? Depression   ? denies now  ? Dyspnea   ? Edema   ? due to taking lyrica  ? Family history of adverse reaction to anesthesia   ? son had n/v  ? Fibromyalgia   ? IBS (irritable bowel syndrome)   ? Leaky heart valve   ? Migraine   ? Orthostatic hypotension   ? from a  previous surgery   ? PONV (postoperative nausea and vomiting)   ? and heart rate goes down " I fainted after surgery when standing in the restroom."  ? Seasonal allergies   ? Spondylolisthesis of lumbar region   ? Wears glasses   ?  ?Family History  ?Problem Relation Age of Onset  ? Mitral valve prolapse Mother   ? Coronary aneurysm Father   ? Diabetes Father   ? Healthy Son   ? ?Past Surgical History:  ?Procedure Laterality Date  ? ABDOMINAL HYSTERECTOMY    ? back ablasion    ? 03/2018 and 05/2018  ? back stimulator  2022  ? BACK SURGERY  2015  ? CHOLECYSTECTOMY    ? HERNIA REPAIR    ? x2  ? OOPHORECTOMY    ? VARICOSE VEIN SURGERY    ? ?Social History  ? ?Social History Narrative  ? Not on file  ? ?Immunization History  ?Administered Date(s) Administered  ? Influenza, High Dose Seasonal PF 07/06/2018, 06/21/2019, 05/08/2020  ? Influenza,inj,Quad PF,6+ Mos 06/30/2017  ? Influenza-Unspecified 06/24/2016, 05/08/2021  ? Janssen (J&J) SARS-COV-2 Vaccination 05/02/2020, 07/03/2020  ? PNEUMOCOCCAL CONJUGATE-20 11/23/2020  ? Pneumococcal Conjugate-13 06/30/2017  ? Zoster Recombinat (Shingrix) 08/16/2018, 11/23/2020  ? Zoster, Live 09/08/2011  ?  ? ?Objective: ?Vital Signs: BP 133/78 (BP Location: Right Arm,  Patient Position: Sitting, Cuff Size: Normal)   Pulse 77   Ht 5' 3.75" (1.619 m)   Wt 188 lb (85.3 kg)   BMI 32.52 kg/m?   ? ?Physical Exam ?Vitals and nursing note reviewed.  ?Constitutional:   ?   Appearance: She is well-developed.  ?HENT:  ?   Head: Normocephalic and atraumatic.  ?Eyes:  ?   Conjunctiva/sclera: Conjunctivae normal.  ?Cardiovascular:  ?   Rate and Rhythm: Normal rate and regular rhythm.  ?   Heart sounds: Normal heart sounds.  ?Pulmonary:  ?   Effort: Pulmonary effort is normal.  ?   Breath sounds: Normal breath sounds.  ?Abdominal:  ?   General: Bowel sounds are normal.  ?   Palpations: Abdomen is soft.  ?Musculoskeletal:  ?   Cervical back: Normal range of motion.  ?Lymphadenopathy:  ?   Cervical: No cervical adenopathy.  ?Skin: ?   General: Skin is  warm and dry.  ?   Capillary Refill: Capillary refill takes less than 2 seconds.  ?Neurological:  ?   Mental Status: She is alert and oriented to person, place, and time.  ?Psychiatric:     ?   Behavior: Behavior normal.  ?  ? ?Musculoskeletal Exam: C-spine was in good range of motion.  She had limited painful range of motion lumbar spine.  Shoulder joints, elbow joints, wrist joints with good range of motion.  There was no synovitis over wrist joints or MCP joints.  PIP and DIP thickening was noted.  Hip joints with good range of motion.  She had tenderness over right trochanteric bursa.  She had limited extension of bilateral knee joints with warmth and swelling in her right knee joint.  There was no tenderness over ankles, MTPs or PIPs. ? ?CDAI Exam: ?CDAI Score: -- ?Patient Global: --; Provider Global: -- ?Swollen: --; Tender: -- ?Joint Exam 01/07/2022  ? ?No joint exam has been documented for this visit  ? ?There is currently no information documented on the homunculus. Go to the Rheumatology activity and complete the homunculus joint exam. ? ?Investigation: ?No additional findings. ? ?Imaging: ?No results found. ? ?Recent Labs: ?Lab Results   ?Component Value Date  ? WBC 7.0 03/25/2021  ? HGB 14.2 03/25/2021  ? PLT 270.0 03/25/2021  ? NA 139 03/25/2021  ? K 3.7 03/25/2021  ? CL 102 03/25/2021  ? CO2 29 03/25/2021  ? GLUCOSE 94 03/25/2021  ? BUN 20 07/1

## 2021-12-26 ENCOUNTER — Telehealth: Payer: Self-pay | Admitting: Family Medicine

## 2021-12-26 NOTE — Telephone Encounter (Signed)
The patients HA from yesterday is better, but not as bad as yesterday. ?She took a Topamax and got better last night. ?She did see her urine culture and wants to know should she stay on the antibiotic. ?

## 2021-12-26 NOTE — Telephone Encounter (Signed)
Patient informed of PCP instructions. ?She had no other questions or concerns. ?

## 2021-12-26 NOTE — Telephone Encounter (Signed)
Called the patient left message to call back 

## 2021-12-31 DIAGNOSIS — J3081 Allergic rhinitis due to animal (cat) (dog) hair and dander: Secondary | ICD-10-CM | POA: Diagnosis not present

## 2021-12-31 DIAGNOSIS — J3089 Other allergic rhinitis: Secondary | ICD-10-CM | POA: Diagnosis not present

## 2021-12-31 DIAGNOSIS — J301 Allergic rhinitis due to pollen: Secondary | ICD-10-CM | POA: Diagnosis not present

## 2022-01-07 ENCOUNTER — Encounter: Payer: Self-pay | Admitting: Rheumatology

## 2022-01-07 ENCOUNTER — Ambulatory Visit (INDEPENDENT_AMBULATORY_CARE_PROVIDER_SITE_OTHER): Payer: Medicare PPO

## 2022-01-07 ENCOUNTER — Ambulatory Visit: Payer: Medicare PPO | Admitting: Rheumatology

## 2022-01-07 VITALS — BP 133/78 | HR 77 | Ht 63.75 in | Wt 188.0 lb

## 2022-01-07 DIAGNOSIS — M8589 Other specified disorders of bone density and structure, multiple sites: Secondary | ICD-10-CM | POA: Diagnosis not present

## 2022-01-07 DIAGNOSIS — R002 Palpitations: Secondary | ICD-10-CM

## 2022-01-07 DIAGNOSIS — M797 Fibromyalgia: Secondary | ICD-10-CM | POA: Diagnosis not present

## 2022-01-07 DIAGNOSIS — M79642 Pain in left hand: Secondary | ICD-10-CM

## 2022-01-07 DIAGNOSIS — E782 Mixed hyperlipidemia: Secondary | ICD-10-CM

## 2022-01-07 DIAGNOSIS — R5382 Chronic fatigue, unspecified: Secondary | ICD-10-CM

## 2022-01-07 DIAGNOSIS — F419 Anxiety disorder, unspecified: Secondary | ICD-10-CM

## 2022-01-07 DIAGNOSIS — G8929 Other chronic pain: Secondary | ICD-10-CM | POA: Diagnosis not present

## 2022-01-07 DIAGNOSIS — M1712 Unilateral primary osteoarthritis, left knee: Secondary | ICD-10-CM

## 2022-01-07 DIAGNOSIS — M5136 Other intervertebral disc degeneration, lumbar region: Secondary | ICD-10-CM

## 2022-01-07 DIAGNOSIS — T7840XS Allergy, unspecified, sequela: Secondary | ICD-10-CM

## 2022-01-07 DIAGNOSIS — F411 Generalized anxiety disorder: Secondary | ICD-10-CM

## 2022-01-07 DIAGNOSIS — M79641 Pain in right hand: Secondary | ICD-10-CM

## 2022-01-07 DIAGNOSIS — K581 Irritable bowel syndrome with constipation: Secondary | ICD-10-CM | POA: Diagnosis not present

## 2022-01-07 DIAGNOSIS — M25561 Pain in right knee: Secondary | ICD-10-CM

## 2022-01-07 DIAGNOSIS — M25562 Pain in left knee: Secondary | ICD-10-CM

## 2022-01-07 DIAGNOSIS — M47816 Spondylosis without myelopathy or radiculopathy, lumbar region: Secondary | ICD-10-CM | POA: Diagnosis not present

## 2022-01-07 DIAGNOSIS — I10 Essential (primary) hypertension: Secondary | ICD-10-CM

## 2022-01-07 DIAGNOSIS — Z981 Arthrodesis status: Secondary | ICD-10-CM

## 2022-01-07 DIAGNOSIS — F3341 Major depressive disorder, recurrent, in partial remission: Secondary | ICD-10-CM

## 2022-01-07 DIAGNOSIS — R768 Other specified abnormal immunological findings in serum: Secondary | ICD-10-CM | POA: Diagnosis not present

## 2022-01-14 DIAGNOSIS — J4540 Moderate persistent asthma, uncomplicated: Secondary | ICD-10-CM | POA: Diagnosis not present

## 2022-01-18 LAB — CBC WITH DIFFERENTIAL/PLATELET
Absolute Monocytes: 391 cells/uL (ref 200–950)
Basophils Absolute: 31 cells/uL (ref 0–200)
Basophils Relative: 0.5 %
Eosinophils Absolute: 99 cells/uL (ref 15–500)
Eosinophils Relative: 1.6 %
HCT: 46.2 % — ABNORMAL HIGH (ref 35.0–45.0)
Hemoglobin: 15.4 g/dL (ref 11.7–15.5)
Lymphs Abs: 2015 cells/uL (ref 850–3900)
MCH: 30.7 pg (ref 27.0–33.0)
MCHC: 33.3 g/dL (ref 32.0–36.0)
MCV: 92.2 fL (ref 80.0–100.0)
MPV: 9.9 fL (ref 7.5–12.5)
Monocytes Relative: 6.3 %
Neutro Abs: 3664 cells/uL (ref 1500–7800)
Neutrophils Relative %: 59.1 %
Platelets: 301 10*3/uL (ref 140–400)
RBC: 5.01 10*6/uL (ref 3.80–5.10)
RDW: 12.7 % (ref 11.0–15.0)
Total Lymphocyte: 32.5 %
WBC: 6.2 10*3/uL (ref 3.8–10.8)

## 2022-01-18 LAB — COMPLETE METABOLIC PANEL WITH GFR
AG Ratio: 1.5 (calc) (ref 1.0–2.5)
ALT: 13 U/L (ref 6–29)
AST: 16 U/L (ref 10–35)
Albumin: 4.7 g/dL (ref 3.6–5.1)
Alkaline phosphatase (APISO): 105 U/L (ref 37–153)
BUN: 20 mg/dL (ref 7–25)
CO2: 29 mmol/L (ref 20–32)
Calcium: 9.9 mg/dL (ref 8.6–10.4)
Chloride: 101 mmol/L (ref 98–110)
Creat: 0.85 mg/dL (ref 0.50–1.05)
Globulin: 3.1 g/dL (calc) (ref 1.9–3.7)
Glucose, Bld: 90 mg/dL (ref 65–99)
Potassium: 3.3 mmol/L — ABNORMAL LOW (ref 3.5–5.3)
Sodium: 139 mmol/L (ref 135–146)
Total Bilirubin: 0.5 mg/dL (ref 0.2–1.2)
Total Protein: 7.8 g/dL (ref 6.1–8.1)
eGFR: 74 mL/min/{1.73_m2} (ref >=60)

## 2022-01-18 LAB — BETA-2 GLYCOPROTEIN ANTIBODIES
Beta-2 Glyco 1 IgA: <2 U/mL (ref <20.0)
Beta-2 Glyco 1 IgM: <2 U/mL (ref <20.0)
Beta-2 Glyco I IgG: <2 U/mL (ref <20.0)

## 2022-01-18 LAB — ANTI-NUCLEAR AB-TITER (ANA TITER)
ANA TITER: 1:1280 {titer} — ABNORMAL HIGH
ANA Titer 1: 1:320 {titer} — ABNORMAL HIGH

## 2022-01-18 LAB — ANTI-SCLERODERMA ANTIBODY: Scleroderma (Scl-70) (ENA) Antibody, IgG: <1 AI

## 2022-01-18 LAB — SEDIMENTATION RATE: Sed Rate: 11 mm/h (ref 0–30)

## 2022-01-18 LAB — GLUCOSE 6 PHOSPHATE DEHYDROGENASE: G-6PDH: 14.1 U/g Hgb (ref 7.0–20.5)

## 2022-01-18 LAB — LUPUS ANTICOAGULANT EVAL W/ REFLEX
PTT-LA Screen: 32 s (ref <=40)
dRVVT: 37 s (ref <=45)

## 2022-01-18 LAB — SJOGRENS SYNDROME-B EXTRACTABLE NUCLEAR ANTIBODY: SSB (La) (ENA) Antibody, IgG: <1 AI

## 2022-01-18 LAB — CARDIOLIPIN ANTIBODIES, IGG, IGM, IGA
Anticardiolipin IgA: <2 APL-U/mL (ref <20.0)
Anticardiolipin IgG: <2 GPL-U/mL (ref <20.0)
Anticardiolipin IgM: <2 MPL-U/mL (ref <20.0)

## 2022-01-18 LAB — CK: Total CK: 34 U/L (ref 29–143)

## 2022-01-18 LAB — C3 AND C4
C3 Complement: 179 mg/dL (ref 83–193)
C4 Complement: 26 mg/dL (ref 15–57)

## 2022-01-18 LAB — URIC ACID: Uric Acid, Serum: 4.9 mg/dL (ref 2.5–7.0)

## 2022-01-18 LAB — SJOGRENS SYNDROME-A EXTRACTABLE NUCLEAR ANTIBODY: SSA (Ro) (ENA) Antibody, IgG: <1 AI

## 2022-01-18 LAB — RNP ANTIBODY: Ribonucleic Protein(ENA) Antibody, IgG: <1 AI

## 2022-01-18 LAB — ANTI-DNA ANTIBODY, DOUBLE-STRANDED: ds DNA Ab: 2 IU/mL

## 2022-01-18 LAB — 14-3-3 ETA PROTEIN: 14-3-3 eta Protein: <0.2 ng/mL (ref <0.2)

## 2022-01-18 LAB — ANA: Anti Nuclear Antibody (ANA): POSITIVE — AB

## 2022-01-18 NOTE — Progress Notes (Signed)
Potassium is low.  Please have patient discuss low potassium with her PCP. ANA is positive.  All other labs are within normal limits.  I will discuss results at the follow-up visit.

## 2022-01-19 ENCOUNTER — Telehealth: Payer: Self-pay | Admitting: Rheumatology

## 2022-01-19 NOTE — Progress Notes (Signed)
We have scheduled ultrasound of bilateral hands to look for inflammation.

## 2022-01-19 NOTE — Telephone Encounter (Signed)
See lab note for details. Patient spoke with Ivin Booty.  ?

## 2022-01-19 NOTE — Telephone Encounter (Signed)
Patient called the office stating she had a voicemail from [place] and requests a return call.  ?

## 2022-01-22 NOTE — Progress Notes (Signed)
Office Visit Note  Patient: Lisa Morrison             Date of Birth: 1951-06-25           MRN: [id number]             PCP: Shelda Pal, DO Referring: Shelda Pal* Visit Date: 02/05/2022 Occupation: @GUAROCC @  Subjective:  Pain in multiple joints  History of Present Illness: Lisa Morrison is a 71 y.o. female with history of positive ANA, osteoarthritis and fibromyalgia syndrome.  She states she continues to have pain and discomfort in all of her joints and muscles.  She complains of discomfort in her bilateral hands, bilateral knee joints in her feet.  She states her knee joints continuously hurt.  She also has ongoing lower back pain.  She notices intermittent swelling in her left hand.  She denies any history of oral ulcers, nasal ulcers, malar rash, photosensitivity, lymphadenopathy.  She gets mild Raynauds symptoms during the cold weather.  She is on meloxicam and also takes some over-the-counter Tylenol despite of that she lives in constant discomfort.  She was diagnosed with fibromyalgia syndrome several years ago.  She states that Lyrica gives her some relief.  Activities of Daily Living:  Patient reports morning stiffness for all day.  Patient Reports nocturnal pain.  Difficulty dressing/grooming: Denies Difficulty climbing stairs: Reports Difficulty getting out of chair: Reports Difficulty using hands for taps, buttons, cutlery, and/or writing: Reports  Review of Systems  Constitutional:  Positive for fatigue.  HENT:  Negative for mouth sores, mouth dryness and nose dryness.   Eyes:  Negative for pain, itching and dryness.  Respiratory:  Positive for shortness of breath. Negative for difficulty breathing.        Pulmonary and cardiology w/u negative per patient.  Cardiovascular:  Negative for chest pain and palpitations.  Gastrointestinal:  Negative for blood in stool, constipation and diarrhea.  Endocrine: Negative for increased urination.   Genitourinary:  Positive for nocturia. Negative for difficulty urinating.  Musculoskeletal:  Positive for joint pain, joint pain, joint swelling, myalgias, morning stiffness, muscle tenderness and myalgias.  Skin:  Positive for color change. Negative for rash, redness and sensitivity to sunlight.  Allergic/Immunologic: Negative for susceptible to infections.  Neurological:  Positive for headaches. Negative for dizziness, numbness, memory loss and weakness.  Hematological:  Positive for bruising/bleeding tendency.  Psychiatric/Behavioral:  Negative for depressed mood, confusion and sleep disturbance. The patient is nervous/anxious.    PMFS History:  Patient Active Problem List   Diagnosis Date Noted   Essential hypertension 06/10/2020   GAD (generalized anxiety disorder) 06/10/2020   Allergies 04/09/2020   Elevated blood-pressure reading, without diagnosis of hypertension 02/15/2020   Body mass index (BMI) 29.0-29.9, adult 11/07/2019   S/P lumbar fusion 03/20/2019   Lumbar facet arthropathy 10/27/2018   Asbestos exposure 09/30/2016   DDD (degenerative disc disease), lumbar 09/30/2016   Hyperglycemia 09/30/2016   Mixed hyperlipidemia 09/30/2016   Fibromyalgia 04/27/2016   Anxiety 04/01/2016   Arthritis of left knee 04/01/2016   Chronic fatigue 04/01/2016   Irritable bowel syndrome with constipation 04/01/2016   Lumbar stenosis 04/01/2016   Recurrent major depressive disorder ([place]) 04/01/2016   Chest pain syndrome 03/31/2016   Palpitations 03/31/2016   Shortness of breath 03/31/2016    Past Medical History:  Diagnosis Date   Anxiety    Arthritis    Back pain    CFS (chronic fatigue syndrome)    Depression  denies now   Dyspnea    Edema    due to taking lyrica   Family history of adverse reaction to anesthesia    son had n/v   Fibromyalgia    IBS (irritable bowel syndrome)    Leaky heart valve    Migraine    Orthostatic hypotension    from a  previous surgery     PONV (postoperative nausea and vomiting)    and heart rate goes down " I fainted after surgery when standing in the restroom."   Seasonal allergies    Spondylolisthesis of lumbar region    Wears glasses     Family History  Problem Relation Age of Onset   Mitral valve prolapse Mother    Coronary aneurysm Father    Diabetes Father    Healthy Son    Past Surgical History:  Procedure Laterality Date   ABDOMINAL HYSTERECTOMY     back ablasion     03/2018 and 05/2018   back stimulator  2022   BACK SURGERY  2015   [place]     x2   [place] History   Social History Narrative   Not on file   Immunization History  Administered Date(s) Administered   Influenza, High Dose Seasonal PF 07/06/2018, 06/21/2019, 05/08/2020   Influenza,inj,Quad PF,6+ Mos 06/30/2017   Influenza-Unspecified 06/24/2016, 05/08/2021   Janssen (J&J) SARS-COV-2 Vaccination 05/02/2020, 07/03/2020   PNEUMOCOCCAL CONJUGATE-20 11/23/2020   Pneumococcal Conjugate-13 06/30/2017   Zoster Recombinat (Shingrix) 08/16/2018, 11/23/2020   Zoster, Live 09/08/2011     Objective: Vital Signs: BP 128/84 (BP Location: Left Arm, Patient Position: Sitting, Cuff Size: Large)   Pulse 85   Ht 5' 4"  (1.626 m)   Wt 187 lb 9.6 oz (85.1 kg)   BMI 32.20 kg/m    Physical Exam Vitals and nursing note reviewed.  Constitutional:      Appearance: She is well-developed.  HENT:     Head: Normocephalic and atraumatic.  Eyes:     Conjunctiva/sclera: Conjunctivae normal.  Cardiovascular:     Rate and Rhythm: Normal rate and regular rhythm.     Heart sounds: Normal heart sounds.  Pulmonary:     Effort: Pulmonary effort is normal.     Breath sounds: Normal breath sounds.  Abdominal:     General: Bowel sounds are normal.     Palpations: Abdomen is soft.  Musculoskeletal:     Cervical back: Normal range of motion.  Lymphadenopathy:     Cervical: No cervical  adenopathy.  Skin:    General: Skin is warm and dry.     Capillary Refill: Capillary refill takes less than 2 seconds.  Neurological:     Mental Status: She is alert and oriented to person, place, and time.  Psychiatric:        Behavior: Behavior normal.     Musculoskeletal Exam: C-spine was in good range of motion.  Shoulder joints, elbow joints, wrist joints, MCPs PIPs and DIPs with good range of motion.  She had bilateral CMC PIP and DIP thickening with no synovitis.  Hip joints with good range of motion.  She had discomfort range of motion of her knee joints.  There was no tenderness over ankles or MTPs.  No warmth swelling or effusion was noted.  CDAI Exam: CDAI Score: -- Patient Global: --; Provider Global: -- Swollen: --; Tender: -- Joint Exam 02/05/2022  No joint exam has been documented for this visit   There is currently no information documented on the homunculus. Go to the Rheumatology activity and complete the homunculus joint exam.  Investigation: No additional findings.  Imaging: US Guided Needle Placement  Result Date: 01/29/2022 Ultrasound examination of the bilateral hands was performed per EULAR recommendations. Using 15 MHz transducer, grayscale and power Doppler bilateral second, third, and fifth MCP joints and bilateral wrist joints both dorsal and volar aspects were evaluated to look for synovitis or tenosynovitis. The findings were there was no synovitis or tenosynovitis on ultrasound examination. Right median nerve was 0.09 cm squares which was within normal limits and left median nerve was 0.07 cm squares which was within normal limits. Impression: Ultrasound examination did not show any synovitis or tenosynovitis.  Bilateral median nerves are within normal limits.  XR Hand 2 View Left  Result Date: 01/07/2022 Severe CMC narrowing and subluxation was noted.  PIP and DIP narrowing was noted.  Spurring of the third MCP joint was noted. Impression: These  findings are consistent with osteoarthritis of the hand.  XR Hand 2 View Right  Result Date: 01/07/2022 CMC narrowing was noted.  Third MCP narrowing was noted.  PIP and DIP narrowing was noted.  No intercarpal, radiocarpal joint space narrowing was noted.  No erosive changes were noted. Impression: These findings are consistent with osteoarthritis of the hand.  Third MCP narrowing raises concern about pseudogout.  XR KNEE 3 VIEW LEFT  Result Date: 01/07/2022 Severe medial compartment narrowing with medial and intercondylar osteophytes was noted.  Severe patellofemoral narrowing with spurring was noted.  No chondrocalcinosis was noted. Impression: These findings are consistent with severe osteoarthritis and severe chondromalacia patella.  XR KNEE 3 VIEW RIGHT  Result Date: 01/07/2022 Severe lateral compartment narrowing with lateral osteophytes was noted.  Severe patellofemoral narrowing with spurring was noted.  No chondrocalcinosis was noted. Impression: These findings are consistent with severe osteoarthritis and severe chondromalacia patella.   Recent Labs: Lab Results  Component Value Date   WBC 6.2 01/07/2022   HGB 15.4 01/07/2022   PLT 301 01/07/2022   NA 139 01/07/2022   K 3.3 (L) 01/07/2022   CL 101 01/07/2022   CO2 29 01/07/2022   GLUCOSE 90 01/07/2022   BUN 20 01/07/2022   CREATININE 0.85 01/07/2022   BILITOT 0.5 01/07/2022   ALKPHOS 90 03/25/2021   AST 16 01/07/2022   ALT 13 01/07/2022   PROT 7.8 01/07/2022   ALBUMIN 4.5 03/25/2021   CALCIUM 9.9 01/07/2022   GFRAA >60 03/16/2019   Jan 07, 2022 ANA 1: 320NS, ANA 1: 1280 NH, ENA (dsDNA, SSA, SSB, RNP, SCL 70 negative), C3-C4 normal, lupus anticoagulant negative, anticardiolipin negative, beta-2 GP 1 negative, uric acid 4.9, 14 3 3  eta negative, ESR 11, CK 34 G6PD normal   Speciality Comments: No specialty comments available.  Procedures:  No procedures performed Allergies: Augmentin [amoxicillin-pot clavulanate],  Demerol [meperidine], Amoxicillin, and Clavulanic acid   Assessment / Plan:     Visit Diagnoses: Positive ANA (antinuclear antibody) - +ANA 1:320NS,!:1280 NH, ENA negative, complements normal, beta-2 GP 1, anticardiolipin, lupus anticoagulant negative, ESR normal.h/o Raynaud's, dry mouth -she denies any history of oral ulcers, nasal ulcers, malar rash, photosensitivity, lymphadenopathy.  She complains of discomfort in her joints but no synovitis was noted.  We obtained ultrasound of her bilateral hands recently which was negative for synovitis.  Dry mouth probably is related to the medication use.  Her Raynaud's symptoms is mild  only during the winter months.  At this point I cannot make a diagnosis of autoimmune disease.  I advised her to contact me in case she develops any new symptoms.  We will repeat labs in a year prior to her next appointment.  Plan: CBC with Differential/Platelet, COMPLETE METABOLIC PANEL WITH GFR, Protein / creatinine ratio, urine, ANA, Anti-DNA antibody, double-stranded, C3 and C4, Sedimentation rate  Primary osteoarthritis of both hands - Clinical and radiographic findings are consistent with osteoarthritis.  X-ray findings were reviewed with the patient.  MCP narrowing raises concern of chondrocalcinosis.  Joint protection muscle strengthening was discussed.  She also had ultrasound of her bilateral hands which was negative for synovitis.  Primary osteoarthritis of both knees - Bilateral severe OA and severe chondromalacia patella was noted.  Right knee joint with severe lateral compartment and left knee joint with severe medial compartment.  X-ray findings were reviewed with the patient.  X-rays were consistent with severe osteoarthritis and severe chondromalacia patella.  Lower extremity muscle strengthening exercises, weight loss diet and exercise was discussed.  DDD (degenerative disc disease), lumbar - S/p fusion 2020, s/p stimulator placement November 2022 with persistent  lower back pain.  She continues to have lower back pain.  Lumbar facet arthropathy-chronic pain  Raynaud's disease without gangrene-she complains of mild Raynaud's symptoms during the winter months.  She had no nailbed capillary changes or sclerodactyly or Telengectesia's.  I advised her to contact me in case she develops any new symptoms.  Fibromyalgia - Diagnosed 20 years ago.  Pain is manageable on Lyrica.  Chronic fatigue-related to fibromyalgia.  Osteopenia of multiple sites - The BMD measured at Forearm Radius 33% is 0.770 g/cm2 with a T-scoreof -1.2.   Essential hypertension  Mixed hyperlipidemia  Irritable bowel syndrome with constipation  GAD (generalized anxiety disorder)  Allergy, sequela  Orders: Orders Placed This Encounter  Procedures   CBC with Differential/Platelet   COMPLETE METABOLIC PANEL WITH GFR   Protein / creatinine ratio, urine   ANA   Anti-DNA antibody, double-stranded   C3 and C4   Sedimentation rate   No orders of the defined types were placed in this encounter.   Face-to-face time spent with patient was 30 minutes. Greater than 50% of time was spent in counseling and coordination of care.  Follow-Up Instructions: Return in about 1 year (around 02/06/2023) for Osteoarthritis, +ANA.   Bo Merino, MD  Note - This record has been created using Editor, commissioning.  Chart creation errors have been sought, but may not always  have been located. Such creation errors do not reflect on  the standard of medical care.

## 2022-01-29 ENCOUNTER — Ambulatory Visit (INDEPENDENT_AMBULATORY_CARE_PROVIDER_SITE_OTHER): Payer: Medicare PPO

## 2022-01-29 ENCOUNTER — Ambulatory Visit (INDEPENDENT_AMBULATORY_CARE_PROVIDER_SITE_OTHER): Payer: Medicare PPO | Admitting: Rheumatology

## 2022-01-29 DIAGNOSIS — M79642 Pain in left hand: Secondary | ICD-10-CM

## 2022-01-29 DIAGNOSIS — M79641 Pain in right hand: Secondary | ICD-10-CM | POA: Diagnosis not present

## 2022-01-29 NOTE — Progress Notes (Signed)
Visit diagnosis: Pain in both hands and positive ANA  Ultrasound examination of bilateral hands was obtained to look for synovitis or tenosynovitis due to history of recurrent swelling in her bilateral hands.   Ultrasound examination of the bilateral hands was performed per EULAR recommendations. Using 15 MHz transducer, grayscale and power Doppler bilateral second, third, and fifth MCP joints and bilateral wrist joints both dorsal and volar aspects were evaluated to look for synovitis or tenosynovitis. The findings were there was no synovitis or tenosynovitis on ultrasound examination. Right median nerve was 0.09 cm squares which was within normal limits and left median nerve was 0.07 cm squares which was within normal limits.  Impression: Ultrasound examination did not show any synovitis or tenosynovitis.  Bilateral median nerves are within normal limits.  Ultrasound findings were discussed with the patient.  Bo Merino, MD

## 2022-02-03 ENCOUNTER — Ambulatory Visit: Payer: Medicare PPO | Admitting: Podiatry

## 2022-02-03 DIAGNOSIS — M79674 Pain in right toe(s): Secondary | ICD-10-CM

## 2022-02-03 DIAGNOSIS — M79675 Pain in left toe(s): Secondary | ICD-10-CM

## 2022-02-03 DIAGNOSIS — B351 Tinea unguium: Secondary | ICD-10-CM | POA: Diagnosis not present

## 2022-02-05 ENCOUNTER — Ambulatory Visit: Payer: Medicare PPO | Admitting: Rheumatology

## 2022-02-05 ENCOUNTER — Encounter: Payer: Self-pay | Admitting: Rheumatology

## 2022-02-05 VITALS — BP 128/84 | HR 85 | Ht 64.0 in | Wt 187.6 lb

## 2022-02-05 DIAGNOSIS — M19042 Primary osteoarthritis, left hand: Secondary | ICD-10-CM

## 2022-02-05 DIAGNOSIS — R5382 Chronic fatigue, unspecified: Secondary | ICD-10-CM

## 2022-02-05 DIAGNOSIS — M170 Bilateral primary osteoarthritis of knee: Secondary | ICD-10-CM

## 2022-02-05 DIAGNOSIS — M19041 Primary osteoarthritis, right hand: Secondary | ICD-10-CM

## 2022-02-05 DIAGNOSIS — M47816 Spondylosis without myelopathy or radiculopathy, lumbar region: Secondary | ICD-10-CM | POA: Diagnosis not present

## 2022-02-05 DIAGNOSIS — M5136 Other intervertebral disc degeneration, lumbar region: Secondary | ICD-10-CM | POA: Diagnosis not present

## 2022-02-05 DIAGNOSIS — M797 Fibromyalgia: Secondary | ICD-10-CM | POA: Diagnosis not present

## 2022-02-05 DIAGNOSIS — M8589 Other specified disorders of bone density and structure, multiple sites: Secondary | ICD-10-CM

## 2022-02-05 DIAGNOSIS — R768 Other specified abnormal immunological findings in serum: Secondary | ICD-10-CM | POA: Diagnosis not present

## 2022-02-05 DIAGNOSIS — I10 Essential (primary) hypertension: Secondary | ICD-10-CM

## 2022-02-05 DIAGNOSIS — K581 Irritable bowel syndrome with constipation: Secondary | ICD-10-CM

## 2022-02-05 DIAGNOSIS — I7300 Raynaud's syndrome without gangrene: Secondary | ICD-10-CM

## 2022-02-05 DIAGNOSIS — T7840XS Allergy, unspecified, sequela: Secondary | ICD-10-CM

## 2022-02-05 DIAGNOSIS — F411 Generalized anxiety disorder: Secondary | ICD-10-CM

## 2022-02-05 DIAGNOSIS — E782 Mixed hyperlipidemia: Secondary | ICD-10-CM

## 2022-02-05 NOTE — Patient Instructions (Signed)
Please come for labs two weeks prior to your next appointment

## 2022-02-08 NOTE — Progress Notes (Signed)
Subjective: 71 year old female presents the office they for concerns of elongated, thick toenails that are hard for her to trim and for a mild callus on the left foot.  The callus has not been causing much discomfort and is minimal as she has continued with more supportive shoes.  No open sores.  No swelling or redness.  No other concerns.  No fevers or chills.   Objective: AAO x3, NAD DP/PT pulses palpable bilaterally, CRT less than 3 seconds Nails are hypertrophic, dystrophic, brittle, discolored, elongated 10. No surrounding redness or drainage. Tenderness nails 1-5 bilaterally. No open lesions or pre-ulcerative lesions are identified today. Very minimal hyperkeratotic tissue is present left foot medial hallux and left submetatarsal area.  There is no ongoing ulceration drainage or signs of infection. Hammertoes present bilaterally No pain with calf compression, swelling, warmth, erythema No acute changes otherwsie  Assessment: Symptomatic onychomycosis, hyperkeratotic lesion  Plan: -All treatment options discussed with the patient including all alternatives, risks, complications.  -Nails sharply debrided x 10 without complications or bleeding.  -Hyperkeratotic lesions sharply debrided x 1 without any complications or bleeding as a courtesy as the calluses were minimal.   -Continue moisturizer and supportive shoes -Daily foot inspection  Celesta Gentile, DPM

## 2022-02-11 DIAGNOSIS — J4540 Moderate persistent asthma, uncomplicated: Secondary | ICD-10-CM | POA: Diagnosis not present

## 2022-02-12 DIAGNOSIS — J3089 Other allergic rhinitis: Secondary | ICD-10-CM | POA: Diagnosis not present

## 2022-02-12 DIAGNOSIS — J301 Allergic rhinitis due to pollen: Secondary | ICD-10-CM | POA: Diagnosis not present

## 2022-02-12 DIAGNOSIS — J3081 Allergic rhinitis due to animal (cat) (dog) hair and dander: Secondary | ICD-10-CM | POA: Diagnosis not present

## 2022-02-20 ENCOUNTER — Other Ambulatory Visit: Payer: Self-pay | Admitting: Family Medicine

## 2022-02-20 DIAGNOSIS — G43109 Migraine with aura, not intractable, without status migrainosus: Secondary | ICD-10-CM

## 2022-02-25 DIAGNOSIS — J3089 Other allergic rhinitis: Secondary | ICD-10-CM | POA: Diagnosis not present

## 2022-02-25 DIAGNOSIS — J301 Allergic rhinitis due to pollen: Secondary | ICD-10-CM | POA: Diagnosis not present

## 2022-02-25 DIAGNOSIS — J3081 Allergic rhinitis due to animal (cat) (dog) hair and dander: Secondary | ICD-10-CM | POA: Diagnosis not present

## 2022-03-11 DIAGNOSIS — J4540 Moderate persistent asthma, uncomplicated: Secondary | ICD-10-CM | POA: Diagnosis not present

## 2022-03-12 DIAGNOSIS — J3081 Allergic rhinitis due to animal (cat) (dog) hair and dander: Secondary | ICD-10-CM | POA: Diagnosis not present

## 2022-03-12 DIAGNOSIS — J3089 Other allergic rhinitis: Secondary | ICD-10-CM | POA: Diagnosis not present

## 2022-03-12 DIAGNOSIS — J301 Allergic rhinitis due to pollen: Secondary | ICD-10-CM | POA: Diagnosis not present

## 2022-03-19 ENCOUNTER — Other Ambulatory Visit: Payer: Self-pay | Admitting: Family Medicine

## 2022-03-19 DIAGNOSIS — F411 Generalized anxiety disorder: Secondary | ICD-10-CM

## 2022-03-19 DIAGNOSIS — M797 Fibromyalgia: Secondary | ICD-10-CM

## 2022-03-22 ENCOUNTER — Encounter: Payer: Self-pay | Admitting: Family Medicine

## 2022-03-22 ENCOUNTER — Other Ambulatory Visit: Payer: Self-pay | Admitting: Family Medicine

## 2022-03-23 ENCOUNTER — Other Ambulatory Visit: Payer: Self-pay | Admitting: Family Medicine

## 2022-03-23 MED ORDER — PREGABALIN 150 MG PO CAPS
ORAL_CAPSULE | ORAL | 1 refills | Status: DC
Start: 2022-03-23 — End: 2022-09-16

## 2022-03-23 MED ORDER — PREGABALIN 150 MG PO CAPS
ORAL_CAPSULE | ORAL | 1 refills | Status: DC
Start: 2022-03-23 — End: 2022-03-23

## 2022-03-23 NOTE — Addendum Note (Signed)
Addended by: Sharon Seller B on: 03/23/2022 07:47 AM   Modules accepted: Orders

## 2022-03-25 DIAGNOSIS — J3089 Other allergic rhinitis: Secondary | ICD-10-CM | POA: Diagnosis not present

## 2022-03-25 DIAGNOSIS — J3081 Allergic rhinitis due to animal (cat) (dog) hair and dander: Secondary | ICD-10-CM | POA: Diagnosis not present

## 2022-03-25 DIAGNOSIS — J301 Allergic rhinitis due to pollen: Secondary | ICD-10-CM | POA: Diagnosis not present

## 2022-03-30 ENCOUNTER — Encounter: Payer: Medicare PPO | Admitting: Family Medicine

## 2022-04-07 DIAGNOSIS — J4540 Moderate persistent asthma, uncomplicated: Secondary | ICD-10-CM | POA: Diagnosis not present

## 2022-04-08 ENCOUNTER — Encounter: Payer: Self-pay | Admitting: Family Medicine

## 2022-04-08 ENCOUNTER — Ambulatory Visit (HOSPITAL_BASED_OUTPATIENT_CLINIC_OR_DEPARTMENT_OTHER)
Admission: RE | Admit: 2022-04-08 | Discharge: 2022-04-08 | Disposition: A | Discharge status: Home / Self Care | Payer: Medicare PPO | Source: Ambulatory Visit | Attending: Family Medicine | Admitting: Family Medicine

## 2022-04-08 ENCOUNTER — Encounter (HOSPITAL_BASED_OUTPATIENT_CLINIC_OR_DEPARTMENT_OTHER): Payer: Self-pay

## 2022-04-08 ENCOUNTER — Other Ambulatory Visit: Payer: Self-pay | Admitting: Family Medicine

## 2022-04-08 ENCOUNTER — Ambulatory Visit (INDEPENDENT_AMBULATORY_CARE_PROVIDER_SITE_OTHER): Payer: Medicare PPO | Admitting: Family Medicine

## 2022-04-08 VITALS — BP 120/80 | HR 96 | Temp 98.6°F | Ht 64.0 in | Wt 188.0 lb

## 2022-04-08 DIAGNOSIS — M79641 Pain in right hand: Secondary | ICD-10-CM

## 2022-04-08 DIAGNOSIS — Z1231 Encounter for screening mammogram for malignant neoplasm of breast: Secondary | ICD-10-CM | POA: Insufficient documentation

## 2022-04-08 DIAGNOSIS — E782 Mixed hyperlipidemia: Secondary | ICD-10-CM

## 2022-04-08 DIAGNOSIS — M79642 Pain in left hand: Secondary | ICD-10-CM | POA: Diagnosis not present

## 2022-04-08 DIAGNOSIS — E669 Obesity, unspecified: Secondary | ICD-10-CM | POA: Diagnosis not present

## 2022-04-08 DIAGNOSIS — J301 Allergic rhinitis due to pollen: Secondary | ICD-10-CM | POA: Diagnosis not present

## 2022-04-08 DIAGNOSIS — Z0000 Encounter for general adult medical examination without abnormal findings: Secondary | ICD-10-CM

## 2022-04-08 DIAGNOSIS — J3089 Other allergic rhinitis: Secondary | ICD-10-CM | POA: Diagnosis not present

## 2022-04-08 DIAGNOSIS — J3081 Allergic rhinitis due to animal (cat) (dog) hair and dander: Secondary | ICD-10-CM | POA: Diagnosis not present

## 2022-04-08 LAB — LIPID PANEL
Cholesterol: 225 mg/dL — ABNORMAL HIGH (ref 0–200)
HDL: 64.2 mg/dL (ref >39.00)
LDL Cholesterol: 132 mg/dL — ABNORMAL HIGH (ref 0–99)
NonHDL: 160.93
Total CHOL/HDL Ratio: 4
Triglycerides: 145 mg/dL (ref 0.0–149.0)
VLDL: 29 mg/dL (ref 0.0–40.0)

## 2022-04-08 LAB — COMPREHENSIVE METABOLIC PANEL
ALT: 13 U/L (ref 0–35)
AST: 15 U/L (ref 0–37)
Albumin: 4.3 g/dL (ref 3.5–5.2)
Alkaline Phosphatase: 103 U/L (ref 39–117)
BUN: 19 mg/dL (ref 6–23)
CO2: 29 mEq/L (ref 19–32)
Calcium: 9.4 mg/dL (ref 8.4–10.5)
Chloride: 102 mEq/L (ref 96–112)
Creatinine, Ser: 0.89 mg/dL (ref 0.40–1.20)
GFR: 66.01 mL/min (ref >60.00)
Glucose, Bld: 95 mg/dL (ref 70–99)
Potassium: 3.7 mEq/L (ref 3.5–5.1)
Sodium: 140 mEq/L (ref 135–145)
Total Bilirubin: 0.5 mg/dL (ref 0.2–1.2)
Total Protein: 7.1 g/dL (ref 6.0–8.3)

## 2022-04-08 MED ORDER — SEMAGLUTIDE-WEIGHT MANAGEMENT 1 MG/0.5ML ~~LOC~~ SOAJ: 1.0000 mg | SUBCUTANEOUS | 0 refills | Status: DC

## 2022-04-08 MED ORDER — SEMAGLUTIDE-WEIGHT MANAGEMENT 0.5 MG/0.5ML ~~LOC~~ SOAJ: 0.5000 mg | SUBCUTANEOUS | 0 refills | Status: DC

## 2022-04-08 MED ORDER — SEMAGLUTIDE-WEIGHT MANAGEMENT 2.4 MG/0.75ML ~~LOC~~ SOAJ: 2.4000 mg | SUBCUTANEOUS | 0 refills | Status: DC

## 2022-04-08 MED ORDER — SEMAGLUTIDE-WEIGHT MANAGEMENT 1.7 MG/0.75ML ~~LOC~~ SOAJ: 1.7000 mg | SUBCUTANEOUS | 0 refills | Status: DC

## 2022-04-08 MED ORDER — SEMAGLUTIDE-WEIGHT MANAGEMENT 0.25 MG/0.5ML ~~LOC~~ SOAJ: 0.2500 mg | SUBCUTANEOUS | 0 refills | Status: AC

## 2022-04-08 NOTE — Patient Instructions (Addendum)
Give [place] 2-3 business days to get the results of your labs back.   Keep the diet clean and stay active.  Aim to do some physical exertion for 150 minutes per week. This is typically divided into 5 days per week, 30 minutes per day. The activity should be enough to get your heart rate up. Anything is better than nothing if you have time constraints.  Please get me a copy of your advanced directive form at your convenience.   Let me know if there are cost issues.   I recommend getting the flu shot in mid October. This suggestion would change if the CDC comes out with a different recommendation.   We can do knee injections here if you are interested.   Consider Biofreeze on your hands.   Inquire about the tetanus booster at your pharmacy. Insurance will not cover it here.   Foods that may reduce pain: 1) Ginger 2) Blueberries 3) Salmon 4) Pumpkin seeds 5) dark chocolate 6) turmeric 7) tart cherries 8) virgin olive oil 9) chilli peppers 10) mint 11) krill oil  Let us know if you need anything.  Hand Exercises Hand exercises can be helpful for almost anyone. These exercises can strengthen the hands, improve flexibility and movement, and increase blood flow to the hands. These results can make work and daily tasks easier. Hand exercises can be especially helpful for people who have joint pain from arthritis or have nerve damage from overuse (carpal tunnel syndrome). These exercises can also help people who have injured a hand. Exercises Most of these hand exercises are gentle stretching and motion exercises. It is usually safe to do them often throughout the day. Warming up your hands before exercise may help to reduce stiffness. You can do this with gentle massage or by placing your hands in warm water for 10-15 minutes. It is normal to feel some stretching, pulling, tightness, or mild discomfort as you begin new exercises. This will gradually improve. Stop an exercise right away if  you feel sudden, severe pain or your pain gets worse. Ask your health care provider which exercises are best for you. Knuckle bend or "claw" fist Stand or sit with your arm, hand, and all five fingers pointed straight up. Make sure to keep your wrist straight during the exercise. Gently bend your fingers down toward your palm until the tips of your fingers are touching the top of your palm. Keep your big knuckle straight and just bend the small knuckles in your fingers. Hold this position for 3 seconds. Straighten (extend) your fingers back to the starting position. Repeat this exercise 5-10 times with each hand. Full finger fist Stand or sit with your arm, hand, and all five fingers pointed straight up. Make sure to keep your wrist straight during the exercise. Gently bend your fingers into your palm until the tips of your fingers are touching the middle of your palm. Hold this position for 3 seconds. Extend your fingers back to the starting position, stretching every joint fully. Repeat this exercise 5-10 times with each hand. Straight fist Stand or sit with your arm, hand, and all five fingers pointed straight up. Make sure to keep your wrist straight during the exercise. Gently bend your fingers at the big knuckle, where your fingers meet your hand, and the middle knuckle. Keep the knuckle at the tips of your fingers straight and try to touch the bottom of your palm. Hold this position for 3 seconds. Extend your fingers back  to the starting position, stretching every joint fully. Repeat this exercise 5-10 times with each hand. Tabletop Stand or sit with your arm, hand, and all five fingers pointed straight up. Make sure to keep your wrist straight during the exercise. Gently bend your fingers at the big knuckle, where your fingers meet your hand, as far down as you can while keeping the small knuckles in your fingers straight. Think of forming a tabletop with your fingers. Hold this  position for 3 seconds. Extend your fingers back to the starting position, stretching every joint fully. Repeat this exercise 5-10 times with each hand. Finger spread Place your hand flat on a table with your palm facing down. Make sure your wrist stays straight as you do this exercise. Spread your fingers and thumb apart from each other as far as you can until you feel a gentle stretch. Hold this position for 3 seconds. Bring your fingers and thumb tight together again. Hold this position for 3 seconds. Repeat this exercise 5-10 times with each hand. Making circles Stand or sit with your arm, hand, and all five fingers pointed straight up. Make sure to keep your wrist straight during the exercise. Make a circle by touching the tip of your thumb to the tip of your index finger. Hold for 3 seconds. Then open your hand wide. Repeat this motion with your thumb and each finger on your hand. Repeat this exercise 5-10 times with each hand. Thumb motion Sit with your forearm resting on a table and your wrist straight. Your thumb should be facing up toward the ceiling. Keep your fingers relaxed as you move your thumb. Lift your thumb up as high as you can toward the ceiling. Hold for 3 seconds. Bend your thumb across your palm as far as you can, reaching the tip of your thumb for the small finger (pinkie) side of your palm. Hold for 3 seconds. Repeat this exercise 5-10 times with each hand. Grip strengthening  Hold a stress ball or other soft ball in the middle of your hand. Slowly increase the pressure, squeezing the ball as much as you can without causing pain. Think of bringing the tips of your fingers into the middle of your palm. All of your finger joints should bend when doing this exercise. Hold your squeeze for 3 seconds, then relax. Repeat this exercise 5-10 times with each hand. Contact a health care provider if: Your hand pain or discomfort gets much worse when you do an exercise. Your  hand pain or discomfort does not improve within 2 hours after you exercise. If you have any of these problems, stop doing these exercises right away. Do not do them again unless your health care provider says that you can. Get help right away if: You develop sudden, severe hand pain or swelling. If this happens, stop doing these exercises right away. Do not do them again unless your health care provider says that you can. Make sure you discuss any questions you have with your health care provider. Document Revised: 12/15/2018 Document Reviewed: 08/25/2018 Elsevier Patient Education  [place].

## 2022-04-08 NOTE — Progress Notes (Signed)
Chief Complaint  Patient presents with   Annual Exam     Well Woman Lisa Morrison is here for a complete physical.   Her last physical was >1 year ago.  Current diet: in general, a "healthy" diet. Current exercise: none. Weight is increased and she confirms daytime fatigue. Seatbelt? Yes Advanced directive? Yes  Health Maintenance Colonoscopy- Yes Shingrix- Yes DEXA- Yes Mammogram- Yes Tetanus- Due Pneumonia- Yes Hep C screen- Yes  Obesity Patient has gained 20 pounds and feels more fatigued.  She used to go to [employer] and did well but her son has been using her car and she no longer goes.  Diet and exercise as above.  She has never tried any medication before, but is interested in doing so.  No known history of diabetes.  Past Medical History:  Diagnosis Date   Anxiety    Arthritis    Back pain    CFS (chronic fatigue syndrome)    Depression    denies now   Dyspnea    Edema    due to taking lyrica   Family history of adverse reaction to anesthesia    son had n/v   Fibromyalgia    IBS (irritable bowel syndrome)    Leaky heart valve    Migraine    Orthostatic hypotension    from a  previous surgery    PONV (postoperative nausea and vomiting)    and heart rate goes down " I fainted after surgery when standing in the restroom."   Seasonal allergies    Spondylolisthesis of lumbar region    Wears glasses      Past Surgical History:  Procedure Laterality Date   ABDOMINAL HYSTERECTOMY     back ablasion     03/2018 and 05/2018   back stimulator  2022   BACK SURGERY  2015   CHOLECYSTECTOMY     HERNIA REPAIR     x2   OOPHORECTOMY     VARICOSE VEIN SURGERY      Medications  Current Outpatient Medications on File Prior to Visit  Medication Sig Dispense Refill   acetaminophen (TYLENOL) 650 MG CR tablet Take 1,300 mg by mouth 2 (two) times daily as needed.     ADVAIR HFA 230-21 MCG/ACT inhaler Inhale 2 puffs into the lungs 2 (two) times daily.      buPROPion (WELLBUTRIN SR) 150 MG 12 hr tablet Take 1 tablet (150 mg total) by mouth 2 (two) times daily. 180 tablet 1   Calcium Carbonate-Vit D-Min (CALTRATE 600+D PLUS MINERALS) 600-800 MG-UNIT TABS Take by mouth.     EPIPEN 2-PAK 0.3 MG/0.3ML SOAJ injection      levocetirizine (XYZAL) 5 MG tablet      meloxicam (MOBIC) 7.5 MG tablet      montelukast (SINGULAIR) 10 MG tablet Take by mouth.     Multiple Vitamins-Minerals (CENTRUM SILVER 50+WOMEN) TABS Take 1 tablet by mouth daily.     nortriptyline (PAMELOR) 10 MG capsule TAKE 1-2 CAPSULES (10-20 MG TOTAL) BY MOUTH AT BEDTIME. 180 capsule 2   Omalizumab (XOLAIR [place]) Inject into the skin every 30 (thirty) days.     potassium chloride (MICRO-K) 10 MEQ CR capsule TAKE 2 CAPSULES BY MOUTH EVERY DAY 180 capsule 1   pregabalin (LYRICA) 150 MG capsule TAKE 1 CAPSULE BY MOUTH EVERY NIGHT 90 capsule 1   Prucalopride Succinate (MOTEGRITY) 2 MG TABS      topiramate (TOPAMAX) 50 MG tablet TAKE 1 TABLET BY MOUTH EVERY DAY  AT NIGHT 90 tablet 1   triamterene-hydrochlorothiazide (DYAZIDE) 37.5-25 MG capsule TAKE 1 EACH (1 CAPSULE TOTAL) BY MOUTH DAILY. 90 capsule 1   Allergies Allergies  Allergen Reactions   Augmentin [Amoxicillin-Pot Clavulanate] Rash    Did it involve swelling of the face/tongue/throat, SOB, or low BP? Yes Did it involve sudden or severe rash/hives, skin peeling, or any reaction on the inside of your mouth or nose? Yes Did you need to seek medical attention at a hospital or doctor's office? Yes When did it last happen? Over 10 years ago If all above answers are "NO", may proceed with cephalosporin use.   Demerol [Meperidine] Other (See Comments)    Lowers blood pressure    Amoxicillin     Other reaction(s): Loss of consciousness   Clavulanic Acid     Other reaction(s): lowers blood pressure    Review of Systems: Constitutional:  no fevers Eye:  no recent significant change in vision Ears:  No changes in  hearing Nose/Mouth/Throat:  no complaints of nasal congestion, no sore throat Cardiovascular: no chest pain Respiratory:  No shortness of breath Gastrointestinal:  No change in bowel habits GU:  Female: negative for dysuria Integumentary:  no abnormal skin lesions reported Neurologic:  no headaches Endocrine:  denies unexplained weight changes  Exam BP 120/80   Pulse 96   Temp 98.6 F (37 C) (Oral)   Ht '5\' 4"'$  (1.626 m)   Wt 188 lb (85.3 kg)   SpO2 97%   BMI 32.27 kg/m  General:  well developed, well nourished, in no apparent distress Skin:  no significant moles, warts, or growths Head:  no masses, lesions, or tenderness Eyes:  pupils equal and round, sclera anicteric without injection Ears:  canals without lesions, TMs shiny without retraction, no obvious effusion, no erythema Nose:  nares patent, septum midline, mucosa normal, and no drainage or sinus tenderness Throat/Pharynx:  lips and gingiva without lesion; tongue and uvula midline; non-inflamed pharynx; no exudates or postnasal drainage Neck: neck supple without adenopathy, thyromegaly, or masses Lungs:  clear to auscultation, breath sounds equal bilaterally, no respiratory distress Cardio:  regular rate and rhythm, no bruits or LE edema Abdomen:  abdomen soft, nontender; bowel sounds normal; no masses or organomegaly Genital: Deferred Neuro:  gait normal; deep tendon reflexes normal and symmetric Psych: well oriented with normal range of affect and appropriate judgment/insight  Assessment and Plan  Well adult exam  Encounter for screening mammogram for malignant neoplasm of breast - Plan: MM 3D SCREEN BREAST BILATERAL, CANCELED: MM DIGITAL SCREENING BILATERAL  Mixed hyperlipidemia - Plan: Comprehensive metabolic panel, Lipid panel  Obesity (BMI 30-39.9) - Plan: Semaglutide-Weight Management 0.25 MG/0.5ML SOAJ, Semaglutide-Weight Management 0.5 MG/0.5ML SOAJ, Semaglutide-Weight Management 1 MG/0.5ML SOAJ,  Semaglutide-Weight Management 1.7 MG/0.75ML SOAJ, Semaglutide-Weight Management 2.4 MG/0.75ML SOAJ   Well 71 y.o. female. Advanced directive form requested today.  Mammogram ordered.  Inquire about tetanus shot at pharmacy.  Obesity-chronic, uncontrolled.  Start Wegovy 0.25 mg weekly for 4 doses and then increase on a monthly basis.  Goal would be to get to 2.4 mg weekly.  I did caution her this may not be covered by insurance and there may be supply issues.  We will find out.  Also discussed short-term use of phentermine.  She would like to try the injection first. Counseled on diet and exercise. Other orders as above. Follow up in 6 mo for med ck, I would follow up sooner if she is able to get the medication for  wt loss. The patient voiced understanding and agreement to the plan.  [place], DO 04/08/22 9:26 AM

## 2022-04-10 ENCOUNTER — Other Ambulatory Visit: Payer: Self-pay | Admitting: Family Medicine

## 2022-04-10 DIAGNOSIS — R928 Other abnormal and inconclusive findings on diagnostic imaging of breast: Secondary | ICD-10-CM

## 2022-04-20 ENCOUNTER — Other Ambulatory Visit: Payer: Self-pay | Admitting: Family Medicine

## 2022-04-20 ENCOUNTER — Ambulatory Visit
Admission: RE | Admit: 2022-04-20 | Discharge: 2022-04-20 | Disposition: A | Discharge status: Home / Self Care | Payer: Medicare PPO | Source: Ambulatory Visit | Attending: Family Medicine | Admitting: Family Medicine

## 2022-04-20 DIAGNOSIS — N6320 Unspecified lump in the left breast, unspecified quadrant: Secondary | ICD-10-CM

## 2022-04-20 DIAGNOSIS — R928 Other abnormal and inconclusive findings on diagnostic imaging of breast: Secondary | ICD-10-CM

## 2022-04-20 DIAGNOSIS — N6322 Unspecified lump in the left breast, upper inner quadrant: Secondary | ICD-10-CM | POA: Diagnosis not present

## 2022-04-20 DIAGNOSIS — C50212 Malignant neoplasm of upper-inner quadrant of left female breast: Secondary | ICD-10-CM | POA: Diagnosis not present

## 2022-04-22 ENCOUNTER — Telehealth: Payer: Self-pay | Admitting: Hematology and Oncology

## 2022-04-22 DIAGNOSIS — J301 Allergic rhinitis due to pollen: Secondary | ICD-10-CM | POA: Diagnosis not present

## 2022-04-22 DIAGNOSIS — J3089 Other allergic rhinitis: Secondary | ICD-10-CM | POA: Diagnosis not present

## 2022-04-22 DIAGNOSIS — J3081 Allergic rhinitis due to animal (cat) (dog) hair and dander: Secondary | ICD-10-CM | POA: Diagnosis not present

## 2022-04-22 NOTE — Telephone Encounter (Signed)
Left message for patient to return my call in reference to upcoming afternoon clinic appointment on 8/23, paperwork sent via e-mail

## 2022-04-27 ENCOUNTER — Encounter: Payer: Self-pay | Admitting: *Deleted

## 2022-04-27 DIAGNOSIS — C50212 Malignant neoplasm of upper-inner quadrant of left female breast: Secondary | ICD-10-CM | POA: Insufficient documentation

## 2022-04-28 NOTE — Progress Notes (Signed)
Radiation Oncology         [phone] ________________________________  Initial Outpatient Consultation  Name: Lisa Morrison MRN: [id number]  Date: 04/29/2022  DOB: 1951-05-17  KZ:[id number], Crosby Oyster, DO  Jovita Kussmaul, MD   REFERRING PHYSICIAN: Autumn Messing III, MD  DIAGNOSIS: No diagnosis found.  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  CHIEF COMPLAINT: Here to discuss management of left breast cancer  HISTORY OF PRESENT ILLNESS::Lisa Morrison is a 71 y.o. female who presented with a left breast abnormality on the following imaging: bilateral screening mammogram on the date of 04/08/22.  No symptoms, if any, were reported at that time. Diagnostic left breast mammogram and ultrasound on 04/20/22 further revealed a 6 mm mass in the 11 o'clock position of the left breast with features highly suspicious of malignancy. No abnormal left axillary lymph nodes were appreciated.    Biopsy of the 11 o'clock left breast, 3 cmfn, on date of 04/20/22 showed invasive well-differentiated ductal adenocarcinoma measuring 3.5 mm in the greatest linear extent, with low-grade DCIS.  ER status: 100% positive and PR status 100% positive, both with strong staining intensity; Proliferation marker Ki67 at <1%; Her2 status negative; Grade 1. No lymph nodes were examined.   ***  PREVIOUS RADIATION THERAPY: No  PAST MEDICAL HISTORY:  has a past medical history of Anxiety, Arthritis, Back pain, CFS (chronic fatigue syndrome), Depression, Dyspnea, Edema, Family history of adverse reaction to anesthesia, Fibromyalgia, IBS (irritable bowel syndrome), Leaky heart valve, Migraine, Orthostatic hypotension, PONV (postoperative nausea and vomiting), Seasonal allergies, Spondylolisthesis of lumbar region, and Wears glasses.    PAST SURGICAL HISTORY: Past Surgical  History:  Procedure Laterality Date   ABDOMINAL HYSTERECTOMY     back ablasion     03/2018 and 05/2018   back stimulator  2022   BACK SURGERY  2015   CHOLECYSTECTOMY     HERNIA REPAIR     x2   OOPHORECTOMY     VARICOSE VEIN SURGERY      FAMILY HISTORY: family history includes Coronary aneurysm in her father; Diabetes in her father; Healthy in her son; Mitral valve prolapse in her mother.  SOCIAL HISTORY:  reports that she has never smoked. She has been exposed to tobacco smoke. She has never used smokeless tobacco. She reports that she does not drink alcohol and does not use drugs.  ALLERGIES: Augmentin [amoxicillin-pot clavulanate], Demerol [meperidine], Amoxicillin, and Clavulanic acid  MEDICATIONS:  Current Outpatient Medications  Medication Sig Dispense Refill   acetaminophen (TYLENOL) 650 MG CR tablet Take 1,300 mg by mouth 2 (two) times daily as needed.     ADVAIR HFA 230-21 MCG/ACT inhaler Inhale 2 puffs into the lungs 2 (two) times daily.     buPROPion (WELLBUTRIN SR) 150 MG 12 hr tablet Take 1 tablet (150 mg total) by mouth 2 (two) times daily. 180 tablet 1   Calcium Carbonate-Vit D-Min (CALTRATE 600+D PLUS MINERALS) 600-800 MG-UNIT TABS Take by mouth.     EPIPEN 2-PAK 0.3 MG/0.3ML SOAJ injection      levocetirizine (XYZAL) 5 MG tablet      meloxicam (MOBIC) 7.5 MG tablet      montelukast (SINGULAIR) 10 MG tablet Take by mouth.     Multiple Vitamins-Minerals (CENTRUM SILVER 50+WOMEN) TABS Take 1 tablet by mouth daily.     nortriptyline (PAMELOR) 10 MG capsule  TAKE 1-2 CAPSULES (10-20 MG TOTAL) BY MOUTH AT BEDTIME. 180 capsule 2   Omalizumab (XOLAIR [place]) Inject into the skin every 30 (thirty) days.     potassium chloride (MICRO-K) 10 MEQ CR capsule TAKE 2 CAPSULES BY MOUTH EVERY DAY 180 capsule 1   pregabalin (LYRICA) 150 MG capsule TAKE 1 CAPSULE BY MOUTH EVERY NIGHT 90 capsule 1   Prucalopride Succinate (MOTEGRITY) 2 MG TABS      Semaglutide-Weight Management 0.25 MG/0.5ML  SOAJ Inject 0.25 mg into the skin once a week for 28 days. 2 mL 0   [START ON 05/07/2022] Semaglutide-Weight Management 0.5 MG/0.5ML SOAJ Inject 0.5 mg into the skin once a week for 28 days. 2 mL 0   [START ON 06/05/2022] Semaglutide-Weight Management 1 MG/0.5ML SOAJ Inject 1 mg into the skin once a week for 28 days. 2 mL 0   [START ON 07/04/2022] Semaglutide-Weight Management 1.7 MG/0.75ML SOAJ Inject 1.7 mg into the skin once a week for 28 days. 3 mL 0   [START ON 08/02/2022] Semaglutide-Weight Management 2.4 MG/0.75ML SOAJ Inject 2.4 mg into the skin once a week for 28 days. 3 mL 0   topiramate (TOPAMAX) 50 MG tablet TAKE 1 TABLET BY MOUTH EVERY DAY AT NIGHT 90 tablet 1   triamterene-hydrochlorothiazide (DYAZIDE) 37.5-25 MG capsule TAKE 1 EACH (1 CAPSULE TOTAL) BY MOUTH DAILY. 90 capsule 1   No current facility-administered medications for this encounter.    REVIEW OF SYSTEMS: As above in HPI.   PHYSICAL EXAM:  vitals were not taken for this visit.   General: Alert and oriented, in no acute distress HEENT: Head is normocephalic. Extraocular movements are intact. Oropharynx is clear. Neck: Neck is supple, no palpable cervical or supraclavicular lymphadenopathy. Heart: Regular in rate and rhythm with no murmurs, rubs, or gallops. Chest: Clear to auscultation bilaterally, with no rhonchi, wheezes, or rales. Abdomen: Soft, nontender, nondistended, with no rigidity or guarding. Extremities: No cyanosis or edema. Lymphatics: see Neck Exam Skin: No concerning lesions. Musculoskeletal: symmetric strength and muscle tone throughout. Neurologic: Cranial nerves II through XII are grossly intact. No obvious focalities. Speech is fluent. Coordination is intact. Psychiatric: Judgment and insight are intact. Affect is appropriate. Breasts: *** . No other palpable masses appreciated in the breasts or axillae *** .    ECOG = ***  0 - Asymptomatic (Fully active, able to carry on all predisease  activities without restriction)  1 - Symptomatic but completely ambulatory (Restricted in physically strenuous activity but ambulatory and able to carry out work of a light or sedentary nature. For example, light housework, office work)  2 - Symptomatic, <50% in bed during the day (Ambulatory and capable of all self care but unable to carry out any work activities. Up and about more than 50% of waking hours)  3 - Symptomatic, >50% in bed, but not bedbound (Capable of only limited self-care, confined to bed or chair 50% or more of waking hours)  4 - Bedbound (Completely disabled. Cannot carry on any self-care. Totally confined to bed or chair)  5 - Death   Eustace Pen MM, Creech RH, Tormey DC, et al. [phone]). "Toxicity and response criteria of the [facility]". [place] Oncol. 5 (6): 649-55   LABORATORY DATA:  Lab Results  Component Value Date   WBC 6.2 01/07/2022   HGB 15.4 01/07/2022   HCT 46.2 (H) 01/07/2022   MCV 92.2 01/07/2022   PLT 301 01/07/2022   CMP     Component  Value Date/Time   NA 140 04/08/2022 0840   K 3.7 04/08/2022 0840   CL 102 04/08/2022 0840   CO2 29 04/08/2022 0840   GLUCOSE 95 04/08/2022 0840   BUN 19 04/08/2022 0840   CREATININE 0.89 04/08/2022 0840   CREATININE 0.85 01/07/2022 1049   CALCIUM 9.4 04/08/2022 0840   PROT 7.1 04/08/2022 0840   ALBUMIN 4.3 04/08/2022 0840   AST 15 04/08/2022 0840   ALT 13 04/08/2022 0840   ALKPHOS 103 04/08/2022 0840   BILITOT 0.5 04/08/2022 0840   GFRNONAA 59 (L) 03/16/2019 1137   GFRAA >60 03/16/2019 1137         RADIOGRAPHY: [place] LT BREAST BX W LOC DEV 1ST LESION IMG BX SPEC US GUIDE  Addendum Date: 04/22/2022   ADDENDUM REPORT: 04/22/2022 09:02 ADDENDUM: Pathology revealed GRADE 1 INVASIVE WELL-DIFFERENTIATED DUCTAL ADENOCARCINOMA, LOW-GRADE DUCTAL CARCINOMA IN SITU, SOLID TYPE WITHOUT NECROSIS. MICROCALCIFICATIONS PRESENT WITHIN INVASIVE AND IN SITU TUMOR of the LEFT breast, 11 o'clock, 3 cmfn  (ribbon clip). This was found to be concordant by Dr. Nolon Nations. Pathology results were discussed with the patient by telephone. The patient reported doing well after the biopsy with tenderness at the site. Post biopsy instructions and care were reviewed and questions were answered. The patient was encouraged to call [facility] for any additional concerns. The patient was referred to [facility] on April 29, 2022. Pathology results reported by Stacie Acres RN on 04/22/2022. Electronically Signed   By: Nolon Nations M.D.   On: 04/22/2022 09:02   Result Date: 04/22/2022 CLINICAL DATA:  Patient presents for ultrasound-guided core biopsy of the LEFT breast. EXAM: ULTRASOUND GUIDED LEFT BREAST CORE NEEDLE BIOPSY COMPARISON:  Previous exam(s). PROCEDURE: I met with the patient and we discussed the procedure of ultrasound-guided biopsy, including benefits and alternatives. We discussed the high likelihood of a successful procedure. We discussed the risks of the procedure, including infection, bleeding, tissue injury, clip migration, and inadequate sampling. Informed written consent was given. The usual time-out protocol was performed immediately prior to the procedure. Lesion quadrant: UPPER INNER QUADRANT LEFT breast Using sterile technique and 1% Lidocaine as local anesthetic, under direct ultrasound visualization, a 12 gauge spring-loaded device was used to perform biopsy of mass in the 11 o'clock location of the LEFT breast using a LATERAL to MEDIAL approach. At the conclusion of the procedure ribbon shaped tissue marker clip was deployed into the biopsy cavity. Follow up 2 view mammogram was performed and dictated separately. IMPRESSION: Ultrasound guided biopsy of LEFT breast mass. No apparent complications. Electronically Signed: By: Nolon Nations M.D. On: 04/20/2022 11:44  MM CLIP PLACEMENT  LEFT  Result Date: 04/20/2022 CLINICAL DATA:  Status post ultrasound-guided core biopsy of LEFT breast mass. EXAM: 3D DIAGNOSTIC LEFT MAMMOGRAM POST ULTRASOUND BIOPSY COMPARISON:  Previous exam(s). FINDINGS: 3D Mammographic images were obtained following ultrasound guided biopsy of mass in the [place] of the LEFT breast and placement a ribbon shaped clip. The biopsy marking clip is in expected position at the site of biopsy. IMPRESSION: Appropriate positioning of the ribbon shaped biopsy marking clip at the site of biopsy in the distortion in the UPPER INNER QUADRANT LEFT breast. Final Assessment: Post Procedure Mammograms for Marker Placement Electronically Signed   By: Nolon Nations M.D.   On: 04/20/2022 12:21  MM DIAG BREAST TOMO UNI LEFT  Result Date: 04/20/2022 CLINICAL DATA:  Possible distortion  in the upper inner left breast on a recent screening mammogram. EXAM: DIGITAL DIAGNOSTIC UNILATERAL LEFT MAMMOGRAM WITH TOMOSYNTHESIS; ULTRASOUND LEFT BREAST LIMITED TECHNIQUE: Left digital diagnostic mammography and breast tomosynthesis was performed.; Targeted ultrasound examination of the left breast was performed. COMPARISON:  Previous exam(s). ACR Breast Density Category b: There are scattered areas of fibroglandular density. FINDINGS: 3D tomographic and 2D generated true lateral and spot compression images of the left breast confirm a small mass with distortion in the 11 o'clock position of the breast. On physical exam, no mass is palpable in the 11 o'clock position of the left breast or in the left axilla. Targeted ultrasound is performed, showing a vertically oriented, irregular, hypoechoic mass with ill-defined surrounding increased echogenicity poorly defined margins in the 11 o'clock position of the left breast, 3 cm from the nipple. This exhibits dense posterior acoustical shadowing it measures proximally 6 x 5 x 4 mm in size. Ultrasound of the left axilla demonstrated normal appearing  left axillary lymph nodes. IMPRESSION: 6 mm mass in the 11 o'clock position of the left breast with imaging features highly suspicious for malignancy. RECOMMENDATION: Ultrasound-guided core needle biopsy of the 6 mm mass in the 11 o'clock position of the left breast. This has been discussed with the patient and her husband and scheduled at 11:30 a.m. on 04/20/2022. I have discussed the findings and recommendations with the patient. If applicable, a reminder letter will be sent to the patient regarding the next appointment. BI-RADS CATEGORY  5: Highly suggestive of malignancy. Electronically Signed   By: Claudie Revering M.D.   On: 04/20/2022 11:18  US BREAST LTD UNI LEFT INC AXILLA  Result Date: 04/20/2022 CLINICAL DATA:  Possible distortion in the upper inner left breast on a recent screening mammogram. EXAM: DIGITAL DIAGNOSTIC UNILATERAL LEFT MAMMOGRAM WITH TOMOSYNTHESIS; ULTRASOUND LEFT BREAST LIMITED TECHNIQUE: Left digital diagnostic mammography and breast tomosynthesis was performed.; Targeted ultrasound examination of the left breast was performed. COMPARISON:  Previous exam(s). ACR Breast Density Category b: There are scattered areas of fibroglandular density. FINDINGS: 3D tomographic and 2D generated true lateral and spot compression images of the left breast confirm a small mass with distortion in the 11 o'clock position of the breast. On physical exam, no mass is palpable in the 11 o'clock position of the left breast or in the left axilla. Targeted ultrasound is performed, showing a vertically oriented, irregular, hypoechoic mass with ill-defined surrounding increased echogenicity poorly defined margins in the 11 o'clock position of the left breast, 3 cm from the nipple. This exhibits dense posterior acoustical shadowing it measures proximally 6 x 5 x 4 mm in size. Ultrasound of the left axilla demonstrated normal appearing left axillary lymph nodes. IMPRESSION: 6 mm mass in the 11 o'clock position of the  left breast with imaging features highly suspicious for malignancy. RECOMMENDATION: Ultrasound-guided core needle biopsy of the 6 mm mass in the 11 o'clock position of the left breast. This has been discussed with the patient and her husband and scheduled at 11:30 a.m. on 04/20/2022. I have discussed the findings and recommendations with the patient. If applicable, a reminder letter will be sent to the patient regarding the next appointment. BI-RADS CATEGORY  5: Highly suggestive of malignancy. Electronically Signed   By: Claudie Revering M.D.   On: 04/20/2022 11:18  MM 3D SCREEN BREAST BILATERAL  Result Date: 04/09/2022 CLINICAL DATA:  Screening. EXAM: DIGITAL SCREENING BILATERAL MAMMOGRAM WITH TOMOSYNTHESIS AND CAD TECHNIQUE: Bilateral screening digital craniocaudal and mediolateral oblique mammograms were  obtained. Bilateral screening digital breast tomosynthesis was performed. The images were evaluated with computer-aided detection. COMPARISON:  Previous exam(s). ACR Breast Density Category a: The breast tissue is almost entirely fatty. FINDINGS: In the left breast, possible distortion warrants further evaluation. In the right breast, no findings suspicious for malignancy. IMPRESSION: Further evaluation is suggested for possible distortion in the left breast. RECOMMENDATION: Diagnostic mammogram and possibly ultrasound of the left breast. (Code:[id number]) The patient will be contacted regarding the findings, and additional imaging will be scheduled. BI-RADS CATEGORY  0: Incomplete. Need additional imaging evaluation and/or prior mammograms for comparison. Electronically Signed   By: Ammie Ferrier M.D.   On: 04/09/2022 16:02      IMPRESSION/PLAN: ***   It was a pleasure meeting the patient today. We discussed the risks, benefits, and side effects of radiotherapy. I recommend radiotherapy to the *** to reduce her risk of locoregional recurrence by 2/3.  We discussed that radiation would take approximately  *** weeks to complete and that I would give the patient a few weeks to heal following surgery before starting treatment planning. *** If chemotherapy were to be given, this would precede radiotherapy. We spoke about acute effects including skin irritation and fatigue as well as much less common late effects including internal organ injury or irritation. We spoke about the latest technology that is used to minimize the risk of late effects for patients undergoing radiotherapy to the breast or chest wall. No guarantees of treatment were given. The patient is enthusiastic about proceeding with treatment. I look forward to participating in the patient's care.  I will await her referral back to me for postoperative follow-up and eventual CT simulation/treatment planning.  On date of service, in total, I spent *** minutes on this encounter. Patient was seen in person.   __________________________________________   Eppie Gibson, MD  This document serves as a record of services personally performed by Eppie Gibson, MD. It was created on her behalf by Roney Mans, a trained medical scribe. The creation of this record is based on the scribe's personal observations and the provider's statements to them. This document has been checked and approved by the attending provider.

## 2022-04-29 ENCOUNTER — Encounter: Payer: Self-pay | Admitting: Physical Therapy

## 2022-04-29 ENCOUNTER — Inpatient Hospital Stay: Payer: Medicare PPO

## 2022-04-29 ENCOUNTER — Ambulatory Visit: Payer: Medicare PPO | Attending: General Surgery | Admitting: Physical Therapy

## 2022-04-29 ENCOUNTER — Ambulatory Visit (HOSPITAL_BASED_OUTPATIENT_CLINIC_OR_DEPARTMENT_OTHER): Payer: Medicare PPO

## 2022-04-29 ENCOUNTER — Inpatient Hospital Stay (HOSPITAL_BASED_OUTPATIENT_CLINIC_OR_DEPARTMENT_OTHER): Payer: Medicare PPO | Admitting: Genetic Counselor

## 2022-04-29 ENCOUNTER — Encounter: Payer: Self-pay | Admitting: Hematology and Oncology

## 2022-04-29 ENCOUNTER — Other Ambulatory Visit: Payer: Self-pay

## 2022-04-29 ENCOUNTER — Encounter: Payer: Self-pay | Admitting: Radiology

## 2022-04-29 ENCOUNTER — Inpatient Hospital Stay: Payer: Medicare PPO | Attending: Hematology and Oncology | Admitting: Hematology and Oncology

## 2022-04-29 ENCOUNTER — Encounter: Payer: Self-pay | Admitting: General Practice

## 2022-04-29 ENCOUNTER — Ambulatory Visit: Payer: Self-pay | Admitting: General Surgery

## 2022-04-29 ENCOUNTER — Encounter: Payer: Self-pay | Admitting: Radiation Oncology

## 2022-04-29 ENCOUNTER — Ambulatory Visit
Admission: RE | Admit: 2022-04-29 | Discharge: 2022-04-29 | Disposition: A | Discharge status: Home / Self Care | Payer: Medicare PPO | Source: Ambulatory Visit | Attending: Radiation Oncology | Admitting: Radiation Oncology

## 2022-04-29 DIAGNOSIS — C50212 Malignant neoplasm of upper-inner quadrant of left female breast: Secondary | ICD-10-CM

## 2022-04-29 DIAGNOSIS — Z170 Estrogen receptor positive status [ER+]: Secondary | ICD-10-CM | POA: Insufficient documentation

## 2022-04-29 DIAGNOSIS — Z800 Family history of malignant neoplasm of digestive organs: Secondary | ICD-10-CM

## 2022-04-29 DIAGNOSIS — R293 Abnormal posture: Secondary | ICD-10-CM | POA: Insufficient documentation

## 2022-04-29 LAB — CBC WITH DIFFERENTIAL (CANCER CENTER ONLY)
Abs Immature Granulocytes: 0.01 10*3/uL (ref 0.00–0.07)
Basophils Absolute: 0.1 10*3/uL (ref 0.0–0.1)
Basophils Relative: 1 %
Eosinophils Absolute: 0.1 10*3/uL (ref 0.0–0.5)
Eosinophils Relative: 2 %
HCT: 44.7 % (ref 36.0–46.0)
Hemoglobin: 15.5 g/dL — ABNORMAL HIGH (ref 12.0–15.0)
Immature Granulocytes: 0 %
Lymphocytes Relative: 26 %
Lymphs Abs: 1.5 10*3/uL (ref 0.7–4.0)
MCH: 31.2 pg (ref 26.0–34.0)
MCHC: 34.7 g/dL (ref 30.0–36.0)
MCV: 89.9 fL (ref 80.0–100.0)
Monocytes Absolute: 0.4 10*3/uL (ref 0.1–1.0)
Monocytes Relative: 7 %
Neutro Abs: 3.8 10*3/uL (ref 1.7–7.7)
Neutrophils Relative %: 64 %
Platelet Count: 269 10*3/uL (ref 150–400)
RBC: 4.97 MIL/uL (ref 3.87–5.11)
RDW: 12.8 % (ref 11.5–15.5)
WBC Count: 6 10*3/uL (ref 4.0–10.5)
nRBC: 0 % (ref 0.0–0.2)

## 2022-04-29 LAB — CMP (CANCER CENTER ONLY)
ALT: 15 U/L (ref 0–44)
AST: 18 U/L (ref 15–41)
Albumin: 4.7 g/dL (ref 3.5–5.0)
Alkaline Phosphatase: 109 U/L (ref 38–126)
Anion gap: 7 (ref 5–15)
BUN: 14 mg/dL (ref 8–23)
CO2: 30 mmol/L (ref 22–32)
Calcium: 9.4 mg/dL (ref 8.9–10.3)
Chloride: 100 mmol/L (ref 98–111)
Creatinine: 0.75 mg/dL (ref 0.44–1.00)
GFR, Estimated: >60 mL/min (ref >60)
Glucose, Bld: 97 mg/dL (ref 70–99)
Potassium: 3.1 mmol/L — ABNORMAL LOW (ref 3.5–5.1)
Sodium: 137 mmol/L (ref 135–145)
Total Bilirubin: 0.4 mg/dL (ref 0.3–1.2)
Total Protein: 7.6 g/dL (ref 6.5–8.1)

## 2022-04-29 LAB — GENETIC SCREENING ORDER

## 2022-04-29 NOTE — Assessment & Plan Note (Signed)
This is a very pleasant 71 year old postmenopausal female patient with newly diagnosed left breast grade 1 IDC, ER/PR strongly positive, HER2 negative by IHC 1+, low proliferation index Ki-67 of less than 1% referred to breast [place] for additional recommendations.  Given strong ER/PR positivity, low-grade and low proliferation index, I do not see any indication for Oncotype testing.  I discussed this clearly with the patient.  We also discussed about antiestrogen therapy options today. We have discussed options for antiestrogen therapy today  With regards to Tamoxifen, we discussed that this is a SERM, selective estrogen receptor modulator. We discussed mechanism of action of Tamoxifen, adverse effects on Tamoxifen including but not limited to post menopausal symptoms, increased risk of DVT/PE, increased risk of endometrial cancer, questionable cataracts with long term use and increased risk of cardiovascular events in the study which was not statistically significant. A benefit from Tamoxifen would be improvement in bone density.  With regards to aromatase inhibitors, we discussed mechanism of action, adverse effects including but not limited to post menopausal symptoms, arthralgias, myalgias, increased risk of cardiovascular events and bone loss.   Patient is interested in considering aromatase inhibitors but at the same time is very worried about bone aches since she has severe arthritis.  She will return to clinic after surgery to review final pathology and to start antiestrogen therapy

## 2022-04-29 NOTE — Progress Notes (Signed)
[name] NOTE  Patient Care Team: Shelda Pal, DO as PCP - General (Family Medicine) Jovita Kussmaul, MD as Consulting Physician (General Surgery) Benay Pike, MD as Consulting Physician (Hematology and Oncology) Eppie Gibson, MD as Attending Physician (Radiation Oncology) Mauro Kaufmann, RN as Oncology Nurse Navigator Rockwell Germany, RN as Oncology Nurse Navigator  CHIEF COMPLAINTS/PURPOSE OF CONSULTATION:  Newly diagnosed breast cancer  HISTORY OF PRESENTING ILLNESS:  Lisa Morrison 71 y.o. female is here because of recent diagnosis of left breast cancer  I reviewed her records extensively and collaborated the history with the patient.  SUMMARY OF ONCOLOGIC HISTORY: Oncology History  Malignant neoplasm of upper-inner quadrant of left breast in female, estrogen receptor positive ([place])  04/08/2022 Mammogram   Screening mammogram shows distortion in the left breast warranting further evaluation.  No findings suspicious for malignancy in the right breast.  Left breast diagnostic mammogram confirmed a 6 mm mass in the 11 o'clock position of the left breast with imaging features highly suspicious for malignancy.  Ultrasound of the left axilla demonstrated normal-appearing left axillary lymph nodes   04/27/2022 Initial Diagnosis   Malignant neoplasm of upper-inner quadrant of left breast in female, estrogen receptor positive [facility])    Pathology Results   Pathology from the left breast biopsy showed low-grade invasive well-differentiated ductal adenocarcinoma along with low-grade DCIS showing ER 100% positive strong staining PR 100% positive strong staining, Ki-67 of less than 1% and HER2 negative 1+ by [facility]    Patient is here for an initial visit with her husband to the breast [place].  She previously was using hormone replacement therapy for almost 20 years.  She stopped it at the age 4.  She otherwise denies any family history of breast cancer.  There is  1 maternal uncle who had pancreatic cancer out of almost 7 siblings. She denies any palpable changes in her breast. Rest of the pertinent 10 point ROS reviewed and negative  MEDICAL HISTORY:  Past Medical History:  Diagnosis Date   Anxiety    Arthritis    Back pain    Breast cancer ([place])    CFS (chronic fatigue syndrome)    Depression    denies now   Dyspnea    Edema    due to taking lyrica   Family history of adverse reaction to anesthesia    son had n/v   Fibromyalgia    IBS (irritable bowel syndrome)    Leaky heart valve    Migraine    Orthostatic hypotension    from a  previous surgery    PONV (postoperative nausea and vomiting)    and heart rate goes down " I fainted after surgery when standing in the restroom."   Seasonal allergies    Spondylolisthesis of lumbar region    Wears glasses     SURGICAL HISTORY: Past Surgical History:  Procedure Laterality Date   ABDOMINAL HYSTERECTOMY     back ablasion     03/2018 and 05/2018   back stimulator  2022   BACK SURGERY  2015   CHOLECYSTECTOMY     HERNIA REPAIR     x2   OOPHORECTOMY     VARICOSE VEIN SURGERY      SOCIAL HISTORY: Social History   Socioeconomic History   Marital status: Married    Spouse name: Not on file   Number of children: Not on file   Years of education: Not on file   Highest education  level: Not on file  Occupational History   Not on file  Tobacco Use   Smoking status: Never    Passive exposure: Past   Smokeless tobacco: Never  Vaping Use   Vaping Use: Never used  Substance and Sexual Activity   Alcohol use: Never   Drug use: Never   Sexual activity: Not on file  Other Topics Concern   Not on file  Social History Narrative   Not on file   Social Determinants of Health   Financial Resource Strain: Low Risk  (05/29/2021)   Overall Financial Resource Strain (CARDIA)    Difficulty of Paying Living Expenses: Not hard at all  Food Insecurity: No Food Insecurity (05/29/2021)    Hunger Vital Sign    Worried About Running Out of Food in the Last Year: Never true    [place] in the Last Year: Never true  Transportation Needs: No Transportation Needs (05/29/2021)   PRAPARE - Hydrologist (Medical): No    Lack of Transportation (Non-Medical): No  Physical Activity: Inactive (05/29/2021)   Exercise Vital Sign    Days of Exercise per Week: 0 days    Minutes of Exercise per Session: 0 min  Stress: No Stress Concern Present (05/29/2021)   [place]    Feeling of Stress : Not at all  Social Connections: Moderately Integrated (05/29/2021)   Social Connection and Isolation Panel [NHANES]    Frequency of Communication with Friends and Family: More than three times a week    Frequency of Social Gatherings with Friends and Family: More than three times a week    Attends Religious Services: Never    Marine scientist or Organizations: Yes    Attends Archivist Meetings: 1 to 4 times per year    Marital Status: Married  Human resources officer Violence: Not At Risk (05/29/2021)   Humiliation, Afraid, Rape, and Kick questionnaire    Fear of Current or Ex-Partner: No    Emotionally Abused: No    Physically Abused: No    Sexually Abused: No    FAMILY HISTORY: Family History  Problem Relation Age of Onset   Mitral valve prolapse Mother    Coronary aneurysm Father    Diabetes Father    Healthy Son     ALLERGIES:  is allergic to augmentin [amoxicillin-pot clavulanate], demerol [meperidine], amoxicillin, and clavulanic acid.  MEDICATIONS:  Current Outpatient Medications  Medication Sig Dispense Refill   acetaminophen (TYLENOL) 650 MG CR tablet Take 1,300 mg by mouth 2 (two) times daily as needed.     ADVAIR HFA 230-21 MCG/ACT inhaler Inhale 2 puffs into the lungs 2 (two) times daily.     buPROPion (WELLBUTRIN SR) 150 MG 12 hr tablet Take 1 tablet (150 mg total) by  mouth 2 (two) times daily. 180 tablet 1   Calcium Carbonate-Vit D-Min (CALTRATE 600+D PLUS MINERALS) 600-800 MG-UNIT TABS Take by mouth.     EPIPEN 2-PAK 0.3 MG/0.3ML SOAJ injection      levocetirizine (XYZAL) 5 MG tablet      meloxicam (MOBIC) 7.5 MG tablet      montelukast (SINGULAIR) 10 MG tablet Take by mouth.     Multiple Vitamins-Minerals (CENTRUM SILVER 50+WOMEN) TABS Take 1 tablet by mouth daily.     nortriptyline (PAMELOR) 10 MG capsule TAKE 1-2 CAPSULES (10-20 MG TOTAL) BY MOUTH AT BEDTIME. 180 capsule 2   Omalizumab (XOLAIR [place]) Inject  into the skin every 30 (thirty) days.     potassium chloride (MICRO-K) 10 MEQ CR capsule TAKE 2 CAPSULES BY MOUTH EVERY DAY 180 capsule 1   pregabalin (LYRICA) 150 MG capsule TAKE 1 CAPSULE BY MOUTH EVERY NIGHT 90 capsule 1   Prucalopride Succinate (MOTEGRITY) 2 MG TABS      Semaglutide-Weight Management 0.25 MG/0.5ML SOAJ Inject 0.25 mg into the skin once a week for 28 days. 2 mL 0   [START ON 05/07/2022] Semaglutide-Weight Management 0.5 MG/0.5ML SOAJ Inject 0.5 mg into the skin once a week for 28 days. 2 mL 0   [START ON 06/05/2022] Semaglutide-Weight Management 1 MG/0.5ML SOAJ Inject 1 mg into the skin once a week for 28 days. 2 mL 0   [START ON 07/04/2022] Semaglutide-Weight Management 1.7 MG/0.75ML SOAJ Inject 1.7 mg into the skin once a week for 28 days. 3 mL 0   [START ON 08/02/2022] Semaglutide-Weight Management 2.4 MG/0.75ML SOAJ Inject 2.4 mg into the skin once a week for 28 days. 3 mL 0   topiramate (TOPAMAX) 50 MG tablet TAKE 1 TABLET BY MOUTH EVERY DAY AT NIGHT 90 tablet 1   triamterene-hydrochlorothiazide (DYAZIDE) 37.5-25 MG capsule TAKE 1 EACH (1 CAPSULE TOTAL) BY MOUTH DAILY. 90 capsule 1   No current facility-administered medications for this visit.    REVIEW OF SYSTEMS:   Constitutional: Denies fevers, chills or abnormal night sweats Eyes: Denies blurriness of vision, double vision or watery eyes Ears, nose, mouth, throat, and  face: Denies mucositis or sore throat Respiratory: Denies cough, dyspnea or wheezes Cardiovascular: Denies palpitation, chest discomfort or lower extremity swelling Gastrointestinal:  Denies nausea, heartburn or change in bowel habits Skin: Denies abnormal skin rashes Lymphatics: Denies new lymphadenopathy or easy bruising Neurological:Denies numbness, tingling or new weaknesses Behavioral/Psych: Mood is stable, no new changes  Breast: Denies any palpable lumps or discharge All other systems were reviewed with the patient and are negative.  PHYSICAL EXAMINATION: ECOG PERFORMANCE STATUS: 0 - Asymptomatic  Vitals:   04/29/22 1301  BP: (!) 129/49  Pulse: 84  Resp: 18  Temp: 97.7 F (36.5 C)  SpO2: 99%   Filed Weights   04/29/22 1301  Weight: 191 lb 3.2 oz (86.7 kg)    GENERAL:alert, no distress and comfortable SKIN: skin color, texture, turgor are normal, no rashes or significant lesions EYES: normal, conjunctiva are pink and non-injected, sclera clear OROPHARYNX:no exudate, no erythema and lips, buccal mucosa, and tongue normal  NECK: supple, thyroid normal size, non-tender, without nodularity LYMPH:  no palpable lymphadenopathy in the cervical, axillary or inguinal LUNGS: clear to auscultation and percussion with normal breathing effort HEART: regular rate & rhythm and no murmurs and no lower extremity edema ABDOMEN:abdomen soft, non-tender and normal bowel sounds Musculoskeletal:no cyanosis of digits and no clubbing  PSYCH: alert & oriented x 3 with fluent speech NEURO: no focal motor/sensory deficits BREAST:No palpable nodules in breast. No palpable axillary or supraclavicular lymphadenopathy   LABORATORY DATA:  I have reviewed the data as listed Lab Results  Component Value Date   WBC 6.0 04/29/2022   HGB 15.5 (H) 04/29/2022   HCT 44.7 04/29/2022   MCV 89.9 04/29/2022   PLT 269 04/29/2022   Lab Results  Component Value Date   NA 137 04/29/2022   K 3.1 (L)  04/29/2022   CL 100 04/29/2022   CO2 30 04/29/2022    RADIOGRAPHIC STUDIES: I have personally reviewed the radiological reports and agreed with the findings in the report.  ASSESSMENT AND PLAN:  Malignant neoplasm of upper-inner quadrant of left breast in female, estrogen receptor positive ([place]) This is a very pleasant 71 year old postmenopausal female patient with newly diagnosed left breast grade 1 IDC, ER/PR strongly positive, HER2 negative by IHC 1+, low proliferation index Ki-67 of less than 1% referred to breast [place] for additional recommendations.  Given strong ER/PR positivity, low-grade and low proliferation index, I do not see any indication for Oncotype testing.  I discussed this clearly with the patient.  We also discussed about antiestrogen therapy options today. We have discussed options for antiestrogen therapy today  With regards to Tamoxifen, we discussed that this is a SERM, selective estrogen receptor modulator. We discussed mechanism of action of Tamoxifen, adverse effects on Tamoxifen including but not limited to post menopausal symptoms, increased risk of DVT/PE, increased risk of endometrial cancer, questionable cataracts with long term use and increased risk of cardiovascular events in the study which was not statistically significant. A benefit from Tamoxifen would be improvement in bone density.  With regards to aromatase inhibitors, we discussed mechanism of action, adverse effects including but not limited to post menopausal symptoms, arthralgias, myalgias, increased risk of cardiovascular events and bone loss.   Patient is interested in considering aromatase inhibitors but at the same time is very worried about bone aches since she has severe arthritis.  She will return to clinic after surgery to review final pathology and to start antiestrogen therapy  Total time spent: 60 minutes including history, physical exam, review of records, counseling and coordination of  care  All questions were answered. The patient knows to call the clinic with any problems, questions or concerns.    Benay Pike, MD 04/29/22

## 2022-04-29 NOTE — Therapy (Signed)
OUTPATIENT PHYSICAL THERAPY BREAST CANCER BASELINE EVALUATION   Patient Name: Lisa Morrison MRN: [id number] DOB:Jul 02, 1951, 71 y.o., female Today's Date: 04/29/2022   PT End of Session - 04/29/22 1412     Visit Number 1    Number of Visits 2    Date for PT Re-Evaluation 06/24/22    PT Start Time 1306    PT Stop Time 1331   Also saw pt from 1350-1402 for a total of 37 min   PT Time Calculation (min) 25 min    Activity Tolerance Patient tolerated treatment well    Behavior During Therapy WFL for tasks assessed/performed             Past Medical History:  Diagnosis Date   Anxiety    Arthritis    Back pain    CFS (chronic fatigue syndrome)    Depression    denies now   Dyspnea    Edema    due to taking lyrica   Family history of adverse reaction to anesthesia    son had n/v   Fibromyalgia    IBS (irritable bowel syndrome)    Leaky heart valve    Migraine    Orthostatic hypotension    from a  previous surgery    PONV (postoperative nausea and vomiting)    and heart rate goes down " I fainted after surgery when standing in the restroom."   Seasonal allergies    Spondylolisthesis of lumbar region    Wears glasses    Past Surgical History:  Procedure Laterality Date   ABDOMINAL HYSTERECTOMY     back ablasion     03/2018 and 05/2018   back stimulator  2022   BACK SURGERY  2015   [place]     x2   [place]     Patient Active Problem List   Diagnosis Date Noted   Malignant neoplasm of upper-inner quadrant of left breast in female, estrogen receptor positive ([place]) 04/27/2022   Essential hypertension 06/10/2020   GAD (generalized anxiety disorder) 06/10/2020   Allergies 04/09/2020   Elevated blood-pressure reading, without diagnosis of hypertension 02/15/2020   Body mass index (BMI) 29.0-29.9, adult 11/07/2019   S/P lumbar fusion 03/20/2019   Lumbar facet arthropathy 10/27/2018   Asbestos exposure  09/30/2016   DDD (degenerative disc disease), lumbar 09/30/2016   Hyperglycemia 09/30/2016   Mixed hyperlipidemia 09/30/2016   Fibromyalgia 04/27/2016   Anxiety 04/01/2016   Arthritis of left knee 04/01/2016   Chronic fatigue 04/01/2016   Irritable bowel syndrome with constipation 04/01/2016   Lumbar stenosis 04/01/2016   Recurrent major depressive disorder ([place]) 04/01/2016   Chest pain syndrome 03/31/2016   Palpitations 03/31/2016   Shortness of breath 03/31/2016    REFERRING PROVIDER: Dr. Autumn Messing  REFERRING DIAG: Left breast cancer   THERAPY DIAG:  Malignant neoplasm of upper-inner quadrant of left breast in female, estrogen receptor positive ([place])  Abnormal posture  Rationale for Evaluation and Treatment Rehabilitation  ONSET DATE: 04/08/2022  SUBJECTIVE  SUBJECTIVE STATEMENT: Patient reports she is here today to be seen by her medical team for her newly diagnosed left breast cancer.   PERTINENT HISTORY:  Patient was diagnosed on 04/08/2022 with left grade 1 invasive ductal carcinoma breast cancer. It measures 6 mm of distortion and is located in the upper inner quadrant. It is ER/PR positive and HER2 negative with a Ki67 of < 1%. She has had multiple lumbar spine surgeries including a fusion and most recently a spinal stimulator implanted in 07/2021.  PATIENT GOALS   reduce lymphedema risk and learn post op HEP.   PAIN:  Are you having pain? No but has osteoarthritis throughout   PRECAUTIONS: Active CA Other: Spinal stimulator (can not do SOZO)  HAND DOMINANCE: right  WEIGHT BEARING RESTRICTIONS No  FALLS:  Has patient fallen in last 6 months? No  LIVING ENVIRONMENT: Patient lives with: her husband Lives in: House/apartment Has following equipment at home: None  OCCUPATION:  Retired  LEISURE: She does not exercise  PRIOR LEVEL OF FUNCTION: Independent   OBJECTIVE  COGNITION:  Overall cognitive status: Within functional limits for tasks assessed    POSTURE:  Forward head and rounded shoulders posture  UPPER EXTREMITY AROM/PROM:  A/PROM RIGHT   eval   Shoulder extension 51  Shoulder flexion 143  Shoulder abduction 140  Shoulder internal rotation 56  Shoulder external rotation 79    (Blank rows = not tested)  A/PROM LEFT   eval  Shoulder extension 48  Shoulder flexion 125  Shoulder abduction 137  Shoulder internal rotation 54  Shoulder external rotation 73    (Blank rows = not tested)   CERVICAL AROM: All within normal limits  UPPER EXTREMITY STRENGTH: WNL   LYMPHEDEMA ASSESSMENTS:   LANDMARK RIGHT   eval  10 cm proximal to olecranon process 29.8  Olecranon process 25.1  10 cm proximal to ulnar styloid process 21.4  Just proximal to ulnar styloid process 14.4  Across hand at thumb web space 17.7  At base of 2nd digit 6.3  (Blank rows = not tested)  LANDMARK LEFT   eval  10 cm proximal to olecranon process 29  Olecranon process 24.4  10 cm proximal to ulnar styloid process 19.9  Just proximal to ulnar styloid process 13.9  Across hand at thumb web space 17.3  At base of 2nd digit 5.9  (Blank rows = not tested)     L-DEX FLOWSHEETS - 04/29/22 1400       L-DEX LYMPHEDEMA SCREENING   Measurement Type --   Unable to use L-Dex due to a spinal stimulator             QUICK DASH SURVEY:  Katina Dung - 04/29/22 0001     Open a tight or new jar Mild difficulty    Do heavy household chores (wash walls, wash floors) No difficulty    Carry a shopping bag or briefcase Mild difficulty    Wash your back No difficulty    Use a knife to cut food No difficulty    Recreational activities in which you take some force or impact through your arm, shoulder, or hand (golf, hammering, tennis) Mild difficulty    During the past  week, to what extent has your arm, shoulder or hand problem interfered with your normal social activities with family, friends, neighbors, or groups? Not at all    During the past week, to what extent has your arm, shoulder or hand problem limited your work or other  regular daily activities Slightly    Arm, shoulder, or hand pain. Mild    Tingling (pins and needles) in your arm, shoulder, or hand Mild    Difficulty Sleeping No difficulty    DASH Score 13.64 %              PATIENT EDUCATION:  Education details: Lymphedema risk reduction and post op shoulder/posture HEP Person educated: Patient Education method: Explanation, Demonstration, Handout Education comprehension: Patient verbalized understanding and returned demonstration   HOME EXERCISE PROGRAM: Patient was instructed today in a home exercise program today for post op shoulder range of motion. These included active assist shoulder flexion in sitting, scapular retraction, wall walking with shoulder abduction, and hands behind head external rotation.  She was encouraged to do these twice a day, holding 3 seconds and repeating 5 times when permitted by her physician.   ASSESSMENT:  CLINICAL IMPRESSION: Patient was diagnosed on 04/08/2022 with left grade 1 invasive ductal carcinoma breast cancer. It measures 6 mm of distortion and is located in the upper inner quadrant. It is ER/PR positive and HER2 negative with a Ki67 of < 1%. She has had multiple lumbar spine surgeries including a fusion and most recently a spinal stimulator implanted in 07/2021.Her multidisciplinary medical team met prior to her assessments to determine a recommended treatment plan. She is planning to have a left lumpectomy and sentinel node biopsy followed by radiation and anti-estrogen therapy. She will benefit from a post op PT reassessment to determine needs and from L-Dex screens every 3 months for 2 years to detect subclinical lymphedema.  Pt will benefit  from skilled therapeutic intervention to improve on the following deficits: Decreased knowledge of precautions, impaired UE functional use, pain, decreased ROM, postural dysfunction.   PT treatment/interventions: ADL/self-care home management, pt/family education, therapeutic exercise  REHAB POTENTIAL: Excellent  CLINICAL DECISION MAKING: Stable/uncomplicated  EVALUATION COMPLEXITY: Low   GOALS: Goals reviewed with patient? YES  LONG TERM GOALS: (STG=LTG)    Name Target Date Goal status  1 Pt will be able to verbalize understanding of pertinent lymphedema risk reduction practices relevant to her dx specifically related to skin care.  Baseline:  No knowledge 04/29/2022 Achieved at eval  2 Pt will be able to return demo and/or verbalize understanding of the post op HEP related to regaining shoulder ROM. Baseline:  No knowledge 04/29/2022 Achieved at eval  3 Pt will be able to verbalize understanding of the importance of attending the post op After Breast CA Class for further lymphedema risk reduction education and therapeutic exercise.  Baseline:  No knowledge 04/29/2022 Achieved at eval  4 Pt will demo she has regained full shoulder ROM and function post operatively compared to baselines.  Baseline: See objective measurements taken today. 06/24/2022      PLAN: PT FREQUENCY/DURATION: EVAL and 1 follow up appointment.   PLAN FOR NEXT SESSION: will reassess 3-4 weeks post op to determine needs.   Patient will follow up at outpatient cancer rehab 3-4 weeks following surgery.  If the patient requires physical therapy at that time, a specific plan will be dictated and sent to the referring physician for approval. The patient was educated today on appropriate basic range of motion exercises to begin post operatively and the importance of attending the After Breast Cancer class following surgery.  Patient was educated today on lymphedema risk reduction practices as it pertains to  recommendations that will benefit the patient immediately following surgery.  She verbalized good understanding.  Physical Therapy Information for After Breast Cancer Surgery/Treatment:  Lymphedema is a swelling condition that you may be at risk for in your arm if you have lymph nodes removed from the armpit area.  After a sentinel node biopsy, the risk is approximately 5-9% and is higher after an axillary node dissection.  There is treatment available for this condition and it is not life-threatening.  Contact your physician or physical therapist with concerns. You may begin the 4 shoulder/posture exercises (see additional sheet) when permitted by your physician (typically a week after surgery).  If you have drains, you may need to wait until those are removed before beginning range of motion exercises.  A general recommendation is to not lift your arms above shoulder height until drains are removed.  These exercises should be done to your tolerance and gently.  This is not a "no pain/no gain" type of recovery so listen to your body and stretch into the range of motion that you can tolerate, stopping if you have pain.  If you are having immediate reconstruction, ask your plastic surgeon about doing exercises as he or she may want you to wait. We encourage you to attend the free one time ABC (After Breast Cancer) class offered by [facility].  You will learn information related to lymphedema risk, prevention and treatment and additional exercises to regain mobility following surgery.  You can call [phone] for more information.  This is offered the 1st and 3rd Monday of each month.  You only attend the class one time. While undergoing any medical procedure or treatment, try to avoid blood pressure being taken or needle sticks from occurring on the arm on the side of cancer.   This recommendation begins after surgery and continues for the rest of your life.  This may help reduce  your risk of getting lymphedema (swelling in your arm). An excellent resource for those seeking information on lymphedema is the National Lymphedema Network's web site. It can be accessed at [place].org If you notice swelling in your hand, arm or breast at any time following surgery (even if it is many years from now), please contact your doctor or physical therapist to discuss this.  Lymphedema can be treated at any time but it is easier for you if it is treated early on.  If you feel like your shoulder motion is not returning to normal in a reasonable amount of time, please contact your surgeon or physical therapist.  [facility] [phone]. [address], [place] [address]  ABC CLASS After Breast Cancer Class  After Breast Cancer Class is a specially designed exercise class to assist you in a safe recover after having breast cancer surgery.  In this class you will learn how to get back to full function whether your drains were just removed or if you had surgery a month ago.  This one-time class is held the 1st and 3rd Monday of every month from 11:00 a.m. until 12:00 noon virtually.  This class is FREE and space is limited. For more information or to register for the next available class, call [phone].  Class Goals  Understand specific stretches to improve the flexibility of you chest and shoulder. Learn ways to safely strengthen your upper body and improve your posture. Understand the warning signs of infection and why you may be at risk for an arm infection. Learn about Lymphedema and prevention.  ** You do not attend this  class until after surgery.  Drains must be removed to participate  Patient was instructed today in a home exercise program today for post op shoulder range of motion. These included active assist shoulder flexion in sitting, scapular retraction, wall walking with shoulder abduction, and hands behind head  external rotation.  She was encouraged to do these twice a day, holding 3 seconds and repeating 5 times when permitted by her physician.  Annia Friendly, [place] 04/29/22 2:26 PM

## 2022-04-29 NOTE — Research (Signed)
[employer] 2021-05 - Specimen Collection Study to Evaluate Biomarkers in Subjects with Cancer    04/29/22  CONSENT INTRO:  Patient Lisa Morrison was identified by Dr. Chryl Heck as a potential candidate for the above listed study.  This Clinical Research Coordinator met with Lisa Morrison, [id number], on 04/29/22 in a manner and location that ensures patient privacy to discuss participation in the above listed research study.  Patient is Accompanied by her husband, Lisa Morrison .  A copy of the informed consent document with embedded HIPAA language was provided to the patient.  Patient reads, speaks, and understands [place].   Patient was provided with the business card of this Coordinator and encouraged to contact the research team with any questions.  Approximately 20 minutes were spent with the patient reviewing the informed consent documents.  Patient was provided the option of taking informed consent documents home to review and was encouraged to review at their convenience with their support network, including other care providers. Patient took the consent documents home to review. It was a pleasure meeting patient and her husband. Plan to follow-up early next week to check on questions/concerns or potential interest in study. Look forward to speaking with them soon. Thanked them for their time and consideration of the above mentioned study.   Carol Ada, RT(R)(T) Clinical Research Coordinator

## 2022-04-29 NOTE — Progress Notes (Signed)
[facility] Psychosocial Distress Screening Spiritual Care  Shadowed by [facility]/Counseling Intern, met with Reiko and her husband in [facility] to introduce [facility] team/resources, reviewing distress screen per protocol.  The patient scored a 5 on the Psychosocial Distress Thermometer which indicates moderate distress. Also assessed for distress and other psychosocial needs.    04/29/2022  ONCBCN DISTRESS SCREENING   Screening Type Initial Screening   Distress experienced in past week (1-10) 5   Emotional problem type Nervousness/Anxiety   Information Concerns Type Lack of info about diagnosis;Lack of info about treatment;Lack of info about complementary therapy choices   Referral to support programs Yes    Couple moved from [facility] to [place] to be closer to their now-10yo granddaughter Kathlyn [place]. They are close with their two sons, as well as Colby's other grandparents. Ms Burdell notes that her distress has dissipated since learning more about diagnosis/treatment plan and meeting team through [facility]  Provided empathic listening, emotional support, normalization of feelings, and tips about talking with children about illness.  Follow up needed: Yes.  Ms Rickles welcomes a follow-up check-in call from counseling intern.   [place], [place], [facility] Pager [phone] Voicemail [phone]

## 2022-05-03 ENCOUNTER — Encounter: Payer: Self-pay | Admitting: Genetic Counselor

## 2022-05-03 DIAGNOSIS — Z800 Family history of malignant neoplasm of digestive organs: Secondary | ICD-10-CM | POA: Insufficient documentation

## 2022-05-03 HISTORY — DX: Family history of malignant neoplasm of digestive organs: Z80.0

## 2022-05-03 NOTE — Progress Notes (Signed)
REFERRING PROVIDER: Benay Pike, MD [place],   [id number]  PRIMARY PROVIDER:  Shelda Pal, DO  PRIMARY REASON FOR VISIT:  1. Malignant neoplasm of upper-inner quadrant of left breast in female, estrogen receptor positive ([place])   2. Family history of pancreatic cancer     HISTORY OF PRESENT ILLNESS:   Lisa Morrison, a 71 y.o. female, was seen for a [facility] cancer genetics consultation at the request of Dr. Chryl Heck due to a personal and family history of cancer.  Lisa Morrison presents to clinic today to discuss the possibility of a hereditary predisposition to cancer, to discuss genetic testing, and to further clarify her future cancer risks, as well as potential cancer risks for family members.   In August 2023, at the age of 89, Lisa Morrison was diagnosed with invasive ductal carcinoma of the left breast (ER+/PR+/HER2-).  She also has a history of basal cell carcinoma.   CANCER HISTORY:  Oncology History  Malignant neoplasm of upper-inner quadrant of left breast in female, estrogen receptor positive ([place])  04/08/2022 Mammogram   Screening mammogram shows distortion in the left breast warranting further evaluation.  No findings suspicious for malignancy in the right breast.  Left breast diagnostic mammogram confirmed a 6 mm mass in the 11 o'clock position of the left breast with imaging features highly suspicious for malignancy.  Ultrasound of the left axilla demonstrated normal-appearing left axillary lymph nodes   04/27/2022 Initial Diagnosis   Malignant neoplasm of upper-inner quadrant of left breast in female, estrogen receptor positive [facility])    Pathology Results   Pathology from the left breast biopsy showed low-grade invasive well-differentiated ductal adenocarcinoma along with low-grade DCIS showing ER 100% positive strong staining PR 100% positive strong staining, Ki-67 of less than 1% and HER2 negative 1+ by IHC     Past Medical History:  Diagnosis Date    Anxiety    Arthritis    Back pain    Breast cancer (HCC)    CFS (chronic fatigue syndrome)    Depression    denies now   Dyspnea    Edema    due to taking lyrica   Family history of adverse reaction to anesthesia    son had n/v   Family history of pancreatic cancer 05/03/2022   Fibromyalgia    IBS (irritable bowel syndrome)    Leaky heart valve    Migraine    Orthostatic hypotension    from a  previous surgery    PONV (postoperative nausea and vomiting)    and heart rate goes down " I fainted after surgery when standing in the restroom."   Seasonal allergies    Spondylolisthesis of lumbar region    Wears glasses     Past Surgical History:  Procedure Laterality Date   ABDOMINAL HYSTERECTOMY     back ablasion     03/2018 and 05/2018   back stimulator  2022   BACK SURGERY  2015   CHOLECYSTECTOMY     HERNIA REPAIR     x2   OOPHORECTOMY     VARICOSE VEIN SURGERY      FAMILY HISTORY:  We obtained a detailed, 4-generation family history.  Significant diagnoses are listed below: Family History  Problem Relation Age of Onset   Pancreatic cancer Maternal Uncle 50    Lisa Morrison is unaware of previous family history of genetic testing for hereditary cancer risks. There is no reported Ashkenazi Jewish ancestry. There is no known consanguinity.  GENETIC COUNSELING ASSESSMENT: Lisa Morrison is a 71 y.o. female with a personal and family history of cancer which is somewhat suggestive of a hereditary cancer syndrome and predisposition to cancer given the presence of related cancers in the family. We, therefore, discussed and recommended the following at today's visit.   DISCUSSION: We discussed that 5 - 10% of cancer is hereditary.  Most cases of hereditary breast cancer are associated with mutations in BRCA1/2.  There are other genes that can be associated with hereditary breast or pancreatic cancer syndromes.  We discussed that testing is beneficial for several reasons including knowing how  to follow individuals for their cancer risks and understanding if other family members could be at risk for cancer and allowing them to undergo genetic testing.   We reviewed the characteristics, features and inheritance patterns of hereditary cancer syndromes. We also discussed genetic testing, including the appropriate family members to test, the process of testing, insurance coverage and turn-around-time for results. We discussed the implications of a negative, positive, carrier and/or variant of uncertain significant result. We recommended Lisa Morrison pursue genetic testing for a panel that includes genes associated with breast and pancreatic cancer.   Based on Lisa Morrison personal and family history of cancer, she meets medical criteria for genetic testing. Despite that she meets criteria, she may still have an out of pocket cost. We discussed that if her out of pocket cost for testing is over $100, the laboratory should contact her and discuss the self-pay prices and/or patient pay assistance programs.     PLAN: Lisa Morrison did not wish to pursue genetic testing at today's visit.  She has a large family (many aunts/uncles/cousins) with relatively little cancer history.  She feels she is at low risk for a hereditary cancer syndrome.  She may be interested in considering testing towards the end of her treatment. We understand this decision and remain available to coordinate genetic testing at any time in the future. We, therefore, recommend Lisa Morrison continue to follow the cancer screening guidelines given by her primary healthcare provider.  Lastly, we encouraged Lisa Morrison to remain in contact with cancer genetics annually so that we can continuously update the family history and inform her of any changes in cancer genetics and testing that may be of benefit for this family.   Lisa Morrison questions were answered to her satisfaction today. Our contact information was provided should additional questions or  concerns arise. Thank you for the referral and allowing [place] to share in the care of your patient.   Lisa Morrison M. Joette Catching, [place], [facility] Genetic Counselor [email or web address] (P) [phone]  The patient was seen for a total of 15 minutes in face-to-face genetic counseling.  Patient was accompanied by her husband.  Dr. Roselie Skinner available to discuss this case as needed.    _______________________________________________________________________ For Office Staff:  Number of people involved in session: 2 Was an Intern/ student involved with case: no

## 2022-05-04 ENCOUNTER — Telehealth: Payer: Self-pay | Admitting: Radiology

## 2022-05-04 NOTE — Telephone Encounter (Signed)
[employer] 2021-05 - Specimen Collection Study to Evaluate Biomarkers in Subjects with Cancer    05/04/22  PHONE CALL: Confirmed I was speaking with Lisa Morrison . Informed patient reason for call is to follow-up on the above mentioned study. Patient is still in the process of talking with friends and family about the study and prefers a call tomorrow afternoon. This coordinator expressed understanding and will follow-up with patient tomorrow. Thanked patient for her time and consideration of the above mentioned study.  Carol Ada, RT(R)(T) Clinical Research Coordinator

## 2022-05-05 ENCOUNTER — Telehealth: Payer: Self-pay | Admitting: Radiology

## 2022-05-05 NOTE — Telephone Encounter (Signed)
[employer] 2021-05 - Specimen Collection Study to Evaluate Biomarkers in Subjects with Cancer    05/05/22  PHONE CALL: Confirmed I was speaking with Lisa Morrison . Informed patient reason for call is to follow-up about the above mentioned study. Patient stated she is not interested in participation at this time. This coordinator expressed understanding and thanked patient for her time and consideration of the above mentioned study.   Carol Ada, RT(R)(T) Clinical Research Coordinator

## 2022-05-06 DIAGNOSIS — J301 Allergic rhinitis due to pollen: Secondary | ICD-10-CM | POA: Diagnosis not present

## 2022-05-06 DIAGNOSIS — J3089 Other allergic rhinitis: Secondary | ICD-10-CM | POA: Diagnosis not present

## 2022-05-06 DIAGNOSIS — J3081 Allergic rhinitis due to animal (cat) (dog) hair and dander: Secondary | ICD-10-CM | POA: Diagnosis not present

## 2022-05-07 ENCOUNTER — Telehealth: Payer: Self-pay | Admitting: *Deleted

## 2022-05-07 ENCOUNTER — Ambulatory Visit: Payer: Medicare PPO | Admitting: Podiatry

## 2022-05-07 ENCOUNTER — Encounter: Payer: Self-pay | Admitting: *Deleted

## 2022-05-07 DIAGNOSIS — M79674 Pain in right toe(s): Secondary | ICD-10-CM

## 2022-05-07 DIAGNOSIS — B351 Tinea unguium: Secondary | ICD-10-CM | POA: Diagnosis not present

## 2022-05-07 DIAGNOSIS — M79675 Pain in left toe(s): Secondary | ICD-10-CM

## 2022-05-07 NOTE — Telephone Encounter (Signed)
Left vm regarding bmdc from 04/29/22. Contact information provided for questions or needs.

## 2022-05-07 NOTE — Progress Notes (Signed)
Lysle Morales, counseling intern, called patient to check in on how they are doing emotionally post diagnosis.   Counselor left a voice message and will try to contact patient via telephone next week.   Lysle Morales  Counseling Intern

## 2022-05-11 NOTE — Progress Notes (Signed)
Subjective: 71 year old female presents the office they for concerns of elongated, thick toenails that are hard for her to trim and for a mild callus on the left foot.  States that she is doing well.  No open lesions.  No new changes or concerns.    Objective: AAO x3, NAD DP/PT pulses palpable bilaterally, CRT less than 3 seconds Nails are hypertrophic, dystrophic, brittle, discolored, elongated 10. No surrounding redness or drainage. Tenderness nails 1-5 bilaterally. No open lesions or pre-ulcerative lesions are identified today. Minimal hyperkeratotic tissue is present left foot medial hallux and left submetatarsal area.  There is no ongoing ulceration drainage or signs of infection. Hammertoes present bilaterally No pain with calf compression, swelling, warmth, erythema No acute changes otherwsie  Assessment: Symptomatic onychomycosis, hyperkeratotic lesion  Plan: -All treatment options discussed with the patient including all alternatives, risks, complications.  -Nails sharply debrided x 10 without complications or bleeding.  -Hyperkeratotic lesions sharply debrided x 1 without any complications or bleeding as a courtesy as the calluses were minimal.  Continue with supportive shoes for offloading. -Daily foot inspection  Celesta Gentile, DPM

## 2022-05-13 ENCOUNTER — Other Ambulatory Visit: Payer: Self-pay | Admitting: General Surgery

## 2022-05-13 ENCOUNTER — Encounter: Payer: Self-pay | Admitting: Family Medicine

## 2022-05-13 ENCOUNTER — Ambulatory Visit: Payer: Medicare PPO | Admitting: Family Medicine

## 2022-05-13 VITALS — BP 120/72 | HR 108 | Temp 97.8°F | Ht 64.0 in | Wt 188.0 lb

## 2022-05-13 DIAGNOSIS — G8929 Other chronic pain: Secondary | ICD-10-CM | POA: Diagnosis not present

## 2022-05-13 DIAGNOSIS — R519 Headache, unspecified: Secondary | ICD-10-CM | POA: Diagnosis not present

## 2022-05-13 DIAGNOSIS — G43109 Migraine with aura, not intractable, without status migrainosus: Secondary | ICD-10-CM | POA: Diagnosis not present

## 2022-05-13 DIAGNOSIS — C50912 Malignant neoplasm of unspecified site of left female breast: Secondary | ICD-10-CM | POA: Diagnosis not present

## 2022-05-13 DIAGNOSIS — C50212 Malignant neoplasm of upper-inner quadrant of left female breast: Secondary | ICD-10-CM

## 2022-05-13 DIAGNOSIS — J3081 Allergic rhinitis due to animal (cat) (dog) hair and dander: Secondary | ICD-10-CM | POA: Diagnosis not present

## 2022-05-13 DIAGNOSIS — J301 Allergic rhinitis due to pollen: Secondary | ICD-10-CM | POA: Diagnosis not present

## 2022-05-13 DIAGNOSIS — J3089 Other allergic rhinitis: Secondary | ICD-10-CM | POA: Diagnosis not present

## 2022-05-13 MED ORDER — METHYLPREDNISOLONE ACETATE 80 MG/ML IJ SUSP
80.0000 mg | Freq: Once | INTRAMUSCULAR | Status: AC
  Administered 2022-05-13: 80 mg via INTRAMUSCULAR

## 2022-05-13 MED ORDER — TOPIRAMATE 50 MG PO TABS: ORAL_TABLET | ORAL | 1 refills | Status: DC

## 2022-05-13 NOTE — Progress Notes (Signed)
Chief Complaint  Patient presents with   Headache    Subjective: Patient is a 71 y.o. female here for headaches.  7/10 throbbing around R eye. Seems to be associated with stress. Going on for 6 mo. Seems to be getting worse. Has hx of migraines, does not feel like that. No neuro ss's. She was recently dx'd w breast cancer. Takes Topamax 50 mg qd.   Past Medical History:  Diagnosis Date   Anxiety    Arthritis    Back pain    Breast cancer (HCC)    CFS (chronic fatigue syndrome)    Depression    denies now   Dyspnea    Edema    due to taking lyrica   Family history of adverse reaction to anesthesia    son had n/v   Family history of pancreatic cancer 05/03/2022   Fibromyalgia    IBS (irritable bowel syndrome)    Leaky heart valve    Migraine    Orthostatic hypotension    from a  previous surgery    PONV (postoperative nausea and vomiting)    and heart rate goes down " I fainted after surgery when standing in the restroom."   Seasonal allergies    Spondylolisthesis of lumbar region    Wears glasses     Objective: BP 120/72   Pulse (!) 108   Temp 97.8 F (36.6 C) (Oral)   Ht '5\' 4"'$  (1.626 m)   Wt 188 lb (85.3 kg)   SpO2 99%   BMI 32.27 kg/m  General: Awake, appears stated age Neuro: DTR's equal and symmetric throughout, no clonus, no cerebellar signs, gait is cautious, 5/5 strength throughout MSK: No ttp over TMJ, temporalis, subocc triangle or cervical paraspinal musculature There is tenderness over the maxillary and frontal sinus on the right. Lungs: No accessory muscle use Psych: Age appropriate judgment and insight, normal affect and mood  Assessment and Plan: Chronic nonintractable headache, unspecified headache type - Plan: topiramate (TOPAMAX) 50 MG tablet, methylPREDNISolone acetate (DEPO-MEDROL) injection 80 mg  Migraine with aura, not intractable, without status migrainosus - Plan: topiramate (TOPAMAX) 50 MG tablet, methylPREDNISolone acetate  (DEPO-MEDROL) injection 80 mg  Exacerbation of chronic issue which is not currently controlled.  She will increase her Topamax from 50 mg daily to twice daily.  Depo injection today.  Given her history, we will check a MRI of her brain if she is not improving by Friday. The patient voiced understanding and agreement to the plan.  [place], DO 05/13/22  1:08 PM

## 2022-05-13 NOTE — Patient Instructions (Addendum)
Go back to Topamax 50 mg twice daily.   Consider magnesium 200 mg daily as well.  Keep me in the loop if we aren't turning the corner with pain tomorrow or Friday morning.   Let us know if you need anything.

## 2022-05-14 ENCOUNTER — Other Ambulatory Visit: Payer: Self-pay | Admitting: Family Medicine

## 2022-05-14 ENCOUNTER — Encounter: Payer: Self-pay | Admitting: *Deleted

## 2022-05-14 DIAGNOSIS — C50212 Malignant neoplasm of upper-inner quadrant of left female breast: Secondary | ICD-10-CM

## 2022-05-15 ENCOUNTER — Other Ambulatory Visit: Payer: Self-pay | Admitting: Family Medicine

## 2022-05-15 ENCOUNTER — Encounter (HOSPITAL_BASED_OUTPATIENT_CLINIC_OR_DEPARTMENT_OTHER): Payer: Self-pay | Admitting: General Surgery

## 2022-05-15 ENCOUNTER — Other Ambulatory Visit: Payer: Self-pay

## 2022-05-20 DIAGNOSIS — J3081 Allergic rhinitis due to animal (cat) (dog) hair and dander: Secondary | ICD-10-CM | POA: Diagnosis not present

## 2022-05-20 DIAGNOSIS — J301 Allergic rhinitis due to pollen: Secondary | ICD-10-CM | POA: Diagnosis not present

## 2022-05-20 DIAGNOSIS — J3089 Other allergic rhinitis: Secondary | ICD-10-CM | POA: Diagnosis not present

## 2022-05-21 ENCOUNTER — Ambulatory Visit
Admission: RE | Admit: 2022-05-21 | Discharge: 2022-05-21 | Disposition: A | Discharge status: Home / Self Care | Payer: Medicare PPO | Source: Ambulatory Visit | Attending: General Surgery | Admitting: General Surgery

## 2022-05-21 ENCOUNTER — Encounter (HOSPITAL_BASED_OUTPATIENT_CLINIC_OR_DEPARTMENT_OTHER)
Admission: RE | Admit: 2022-05-21 | Discharge: 2022-05-21 | Disposition: A | Discharge status: Home / Self Care | Payer: Medicare PPO | Source: Ambulatory Visit | Attending: General Surgery | Admitting: General Surgery

## 2022-05-21 DIAGNOSIS — Z01810 Encounter for preprocedural cardiovascular examination: Secondary | ICD-10-CM | POA: Insufficient documentation

## 2022-05-21 DIAGNOSIS — C50212 Malignant neoplasm of upper-inner quadrant of left female breast: Secondary | ICD-10-CM

## 2022-05-21 DIAGNOSIS — Z79899 Other long term (current) drug therapy: Secondary | ICD-10-CM | POA: Insufficient documentation

## 2022-05-21 DIAGNOSIS — M797 Fibromyalgia: Secondary | ICD-10-CM | POA: Diagnosis not present

## 2022-05-21 DIAGNOSIS — Z8249 Family history of ischemic heart disease and other diseases of the circulatory system: Secondary | ICD-10-CM | POA: Diagnosis not present

## 2022-05-21 DIAGNOSIS — J449 Chronic obstructive pulmonary disease, unspecified: Secondary | ICD-10-CM | POA: Diagnosis not present

## 2022-05-21 DIAGNOSIS — I10 Essential (primary) hypertension: Secondary | ICD-10-CM | POA: Diagnosis not present

## 2022-05-21 DIAGNOSIS — Z170 Estrogen receptor positive status [ER+]: Secondary | ICD-10-CM | POA: Diagnosis not present

## 2022-05-21 DIAGNOSIS — M1712 Unilateral primary osteoarthritis, left knee: Secondary | ICD-10-CM | POA: Diagnosis not present

## 2022-05-21 NOTE — Progress Notes (Addendum)
Sent text message reminding pt to come for EKG and pre surgery drink and soap.   Surgical soap given with instructions, pt verbalized understanding. Enhanced Recovery after Surgery  Enhanced Recovery after Surgery is a protocol used to improve the stress on your body and your recovery after surgery.  Patient Instructions  The night before surgery:  No food after midnight. ONLY clear liquids after midnight  The day of surgery (if you do NOT have diabetes):  Drink ONE (1) Pre-Surgery Clear Ensure as directed.   This drink was given to you during your hospital  pre-op appointment visit. The pre-op nurse will instruct you on the time to drink the  Pre-Surgery Ensure depending on your surgery time. Finish the drink at the designated time by the pre-op nurse.  Nothing else to drink after completing the  Pre-Surgery Clear Ensure.  The day of surgery (if you have diabetes): Drink ONE (1) Gatorade 2 (G2) as directed. This drink was given to you during your hospital  pre-op appointment visit.  The pre-op nurse will instruct you on the time to drink the   Gatorade 2 (G2) depending on your surgery time. Color of the Gatorade may vary. Red is not allowed. Nothing else to drink after completing the  Gatorade 2 (G2).         If office.you have questions, please contact your surgeon's office

## 2022-05-22 ENCOUNTER — Encounter (HOSPITAL_BASED_OUTPATIENT_CLINIC_OR_DEPARTMENT_OTHER): Payer: Self-pay | Admitting: General Surgery

## 2022-05-22 ENCOUNTER — Encounter (HOSPITAL_BASED_OUTPATIENT_CLINIC_OR_DEPARTMENT_OTHER)
Admission: RE | Disposition: A | Discharge status: Home / Self Care | Payer: Self-pay | Source: Home / Self Care | Attending: General Surgery

## 2022-05-22 ENCOUNTER — Ambulatory Visit (HOSPITAL_BASED_OUTPATIENT_CLINIC_OR_DEPARTMENT_OTHER)
Admission: RE | Admit: 2022-05-22 | Discharge: 2022-05-22 | Disposition: A | Discharge status: Home / Self Care | Payer: Medicare PPO | Attending: General Surgery | Admitting: General Surgery

## 2022-05-22 ENCOUNTER — Other Ambulatory Visit: Payer: Self-pay

## 2022-05-22 ENCOUNTER — Ambulatory Visit (HOSPITAL_BASED_OUTPATIENT_CLINIC_OR_DEPARTMENT_OTHER): Payer: Medicare PPO | Admitting: Anesthesiology

## 2022-05-22 ENCOUNTER — Ambulatory Visit
Admission: RE | Admit: 2022-05-22 | Discharge: 2022-05-22 | Disposition: A | Discharge status: Home / Self Care | Payer: Medicare PPO | Source: Ambulatory Visit | Attending: General Surgery | Admitting: General Surgery

## 2022-05-22 DIAGNOSIS — Z170 Estrogen receptor positive status [ER+]: Secondary | ICD-10-CM

## 2022-05-22 DIAGNOSIS — C50912 Malignant neoplasm of unspecified site of left female breast: Secondary | ICD-10-CM | POA: Diagnosis not present

## 2022-05-22 DIAGNOSIS — M1712 Unilateral primary osteoarthritis, left knee: Secondary | ICD-10-CM | POA: Insufficient documentation

## 2022-05-22 DIAGNOSIS — J449 Chronic obstructive pulmonary disease, unspecified: Secondary | ICD-10-CM | POA: Insufficient documentation

## 2022-05-22 DIAGNOSIS — C50212 Malignant neoplasm of upper-inner quadrant of left female breast: Secondary | ICD-10-CM | POA: Diagnosis not present

## 2022-05-22 DIAGNOSIS — I10 Essential (primary) hypertension: Secondary | ICD-10-CM | POA: Diagnosis not present

## 2022-05-22 DIAGNOSIS — R928 Other abnormal and inconclusive findings on diagnostic imaging of breast: Secondary | ICD-10-CM | POA: Diagnosis not present

## 2022-05-22 DIAGNOSIS — Z79899 Other long term (current) drug therapy: Secondary | ICD-10-CM | POA: Diagnosis not present

## 2022-05-22 DIAGNOSIS — Z8249 Family history of ischemic heart disease and other diseases of the circulatory system: Secondary | ICD-10-CM | POA: Insufficient documentation

## 2022-05-22 DIAGNOSIS — M797 Fibromyalgia: Secondary | ICD-10-CM | POA: Insufficient documentation

## 2022-05-22 HISTORY — PX: BREAST LUMPECTOMY WITH RADIOACTIVE SEED AND SENTINEL LYMPH NODE BIOPSY: SHX6550

## 2022-05-22 SURGERY — BREAST LUMPECTOMY WITH RADIOACTIVE SEED AND SENTINEL LYMPH NODE BIOPSY
Anesthesia: General | Site: Breast | Laterality: Left

## 2022-05-22 MED ORDER — OXYCODONE HCL 5 MG PO TABS: 5.0000 mg | ORAL_TABLET | Freq: Once | ORAL | Status: DC | PRN

## 2022-05-22 MED ORDER — LACTATED RINGERS IV SOLN: INTRAVENOUS | Status: DC

## 2022-05-22 MED ORDER — OXYCODONE HCL 5 MG PO TABS: 5.0000 mg | ORAL_TABLET | Freq: Four times a day (QID) | ORAL | 0 refills | Status: DC | PRN

## 2022-05-22 MED ORDER — GABAPENTIN 300 MG PO CAPS
300.0000 mg | ORAL_CAPSULE | ORAL | Status: AC
  Administered 2022-05-22: 300 mg via ORAL

## 2022-05-22 MED ORDER — GABAPENTIN 300 MG PO CAPS
ORAL_CAPSULE | ORAL | Status: AC
  Filled 2022-05-22: qty 1

## 2022-05-22 MED ORDER — ACETAMINOPHEN 500 MG PO TABS: 1000.0000 mg | ORAL_TABLET | ORAL | Status: AC

## 2022-05-22 MED ORDER — FENTANYL CITRATE (PF) 100 MCG/2ML IJ SOLN
INTRAMUSCULAR | Status: AC
  Filled 2022-05-22: qty 2

## 2022-05-22 MED ORDER — ACETAMINOPHEN 500 MG PO TABS
ORAL_TABLET | ORAL | Status: AC
  Filled 2022-05-22: qty 2

## 2022-05-22 MED ORDER — ONDANSETRON HCL 4 MG/2ML IJ SOLN
INTRAMUSCULAR | Status: DC | PRN
  Administered 2022-05-22: 4 mg via INTRAVENOUS

## 2022-05-22 MED ORDER — LIDOCAINE HCL (CARDIAC) PF 100 MG/5ML IV SOSY
PREFILLED_SYRINGE | INTRAVENOUS | Status: DC | PRN
  Administered 2022-05-22: 60 mg via INTRAVENOUS

## 2022-05-22 MED ORDER — HYDROMORPHONE HCL 1 MG/ML IJ SOLN: 0.2500 mg | INTRAMUSCULAR | Status: DC | PRN

## 2022-05-22 MED ORDER — VANCOMYCIN HCL IN DEXTROSE 1-5 GM/200ML-% IV SOLN
1000.0000 mg | INTRAVENOUS | Status: AC
  Administered 2022-05-22 (×2): 1000 mg via INTRAVENOUS

## 2022-05-22 MED ORDER — BUPIVACAINE-EPINEPHRINE (PF) 0.5% -1:200000 IJ SOLN
INTRAMUSCULAR | Status: DC | PRN
  Administered 2022-05-22: 30 mL

## 2022-05-22 MED ORDER — ONDANSETRON HCL 4 MG/2ML IJ SOLN
INTRAMUSCULAR | Status: AC
  Filled 2022-05-22: qty 2

## 2022-05-22 MED ORDER — BUPIVACAINE-EPINEPHRINE 0.25% -1:200000 IJ SOLN
INTRAMUSCULAR | Status: DC | PRN
  Administered 2022-05-22: 18 mL

## 2022-05-22 MED ORDER — CELECOXIB 200 MG PO CAPS
ORAL_CAPSULE | ORAL | Status: AC
  Filled 2022-05-22: qty 1

## 2022-05-22 MED ORDER — DEXAMETHASONE SODIUM PHOSPHATE 4 MG/ML IJ SOLN
INTRAMUSCULAR | Status: DC | PRN
  Administered 2022-05-22: 5 mg via INTRAVENOUS

## 2022-05-22 MED ORDER — MIDAZOLAM HCL 2 MG/2ML IJ SOLN: 0.5000 mg | Freq: Once | INTRAMUSCULAR | Status: DC | PRN

## 2022-05-22 MED ORDER — PHENYLEPHRINE 80 MCG/ML (10ML) SYRINGE FOR IV PUSH (FOR BLOOD PRESSURE SUPPORT)
PREFILLED_SYRINGE | INTRAVENOUS | Status: AC
  Filled 2022-05-22: qty 10

## 2022-05-22 MED ORDER — MIDAZOLAM HCL 2 MG/2ML IJ SOLN
INTRAMUSCULAR | Status: AC
  Filled 2022-05-22: qty 2

## 2022-05-22 MED ORDER — PROPOFOL 10 MG/ML IV BOLUS
INTRAVENOUS | Status: AC
  Filled 2022-05-22: qty 20

## 2022-05-22 MED ORDER — CHLORHEXIDINE GLUCONATE CLOTH 2 % EX PADS: 6.0000 | MEDICATED_PAD | Freq: Once | CUTANEOUS | Status: DC

## 2022-05-22 MED ORDER — LIDOCAINE 2% (20 MG/ML) 5 ML SYRINGE
INTRAMUSCULAR | Status: AC
  Filled 2022-05-22: qty 5

## 2022-05-22 MED ORDER — CELECOXIB 200 MG PO CAPS
200.0000 mg | ORAL_CAPSULE | ORAL | Status: AC
  Administered 2022-05-22: 200 mg via ORAL

## 2022-05-22 MED ORDER — MEPERIDINE HCL 25 MG/ML IJ SOLN: 6.2500 mg | INTRAMUSCULAR | Status: DC | PRN

## 2022-05-22 MED ORDER — OXYCODONE HCL 5 MG/5ML PO SOLN: 5.0000 mg | Freq: Once | ORAL | Status: DC | PRN

## 2022-05-22 MED ORDER — FENTANYL CITRATE (PF) 100 MCG/2ML IJ SOLN
100.0000 ug | Freq: Once | INTRAMUSCULAR | Status: AC
  Administered 2022-05-22: 100 ug via INTRAVENOUS

## 2022-05-22 MED ORDER — EPHEDRINE 5 MG/ML INJ
INTRAVENOUS | Status: AC
  Filled 2022-05-22: qty 5

## 2022-05-22 MED ORDER — MIDAZOLAM HCL 2 MG/2ML IJ SOLN
2.0000 mg | Freq: Once | INTRAMUSCULAR | Status: AC
  Administered 2022-05-22: 2 mg via INTRAVENOUS

## 2022-05-22 MED ORDER — MAGTRACE LYMPHATIC TRACER
INTRAMUSCULAR | Status: DC | PRN
  Administered 2022-05-22: 2 mL via INTRAMUSCULAR

## 2022-05-22 MED ORDER — VANCOMYCIN HCL IN DEXTROSE 1-5 GM/200ML-% IV SOLN
INTRAVENOUS | Status: AC
  Filled 2022-05-22: qty 200

## 2022-05-22 MED ORDER — PROPOFOL 10 MG/ML IV BOLUS
INTRAVENOUS | Status: DC | PRN
  Administered 2022-05-22: 150 mg via INTRAVENOUS

## 2022-05-22 MED ORDER — ACETAMINOPHEN 500 MG PO TABS: 1000.0000 mg | ORAL_TABLET | Freq: Once | ORAL | Status: DC

## 2022-05-22 MED ORDER — DEXAMETHASONE SODIUM PHOSPHATE 10 MG/ML IJ SOLN
INTRAMUSCULAR | Status: AC
  Filled 2022-05-22: qty 1

## 2022-05-22 MED ORDER — PROMETHAZINE HCL 25 MG/ML IJ SOLN: 6.2500 mg | INTRAMUSCULAR | Status: DC | PRN

## 2022-05-22 SURGICAL SUPPLY — 44 items
ADH SKN CLS APL DERMABOND .7 (GAUZE/BANDAGES/DRESSINGS) ×1
APL PRP STRL LF DISP 70% ISPRP (MISCELLANEOUS) ×1
APPLIER CLIP 9.375 MED OPEN (MISCELLANEOUS) ×1
APR CLP MED 9.3 20 MLT OPN (MISCELLANEOUS) ×1
BLADE SURG 15 STRL LF DISP TIS (BLADE) ×1 IMPLANT
BLADE SURG 15 STRL SS (BLADE) ×1
CANISTER SUC SOCK COL 7IN (MISCELLANEOUS) IMPLANT
CANISTER SUCT 1200ML W/VALVE (MISCELLANEOUS) IMPLANT
CHLORAPREP W/TINT 26 (MISCELLANEOUS) ×1 IMPLANT
CLIP APPLIE 9.375 MED OPEN (MISCELLANEOUS) ×1 IMPLANT
COVER BACK TABLE 60X90IN (DRAPES) ×1 IMPLANT
COVER MAYO STAND STRL (DRAPES) ×1 IMPLANT
COVER PROBE W GEL 5X96 (DRAPES) ×1 IMPLANT
DERMABOND ADVANCED .7 DNX12 (GAUZE/BANDAGES/DRESSINGS) ×1 IMPLANT
DRAPE LAPAROSCOPIC ABDOMINAL (DRAPES) ×1 IMPLANT
DRAPE UTILITY XL STRL (DRAPES) ×1 IMPLANT
ELECT COATED BLADE 2.86 ST (ELECTRODE) ×1 IMPLANT
ELECT REM PT RETURN 9FT ADLT (ELECTROSURGICAL) ×1
ELECTRODE REM PT RTRN 9FT ADLT (ELECTROSURGICAL) ×1 IMPLANT
GLOVE BIO SURGEON STRL SZ7.5 (GLOVE) ×1 IMPLANT
GLOVE BIOGEL PI IND STRL 7.0 (GLOVE) IMPLANT
GOWN STRL REUS W/ TWL LRG LVL3 (GOWN DISPOSABLE) ×2 IMPLANT
GOWN STRL REUS W/TWL LRG LVL3 (GOWN DISPOSABLE) ×2
ILLUMINATOR WAVEGUIDE N/F (MISCELLANEOUS) IMPLANT
KIT MARKER MARGIN INK (KITS) ×1 IMPLANT
LIGHT WAVEGUIDE WIDE FLAT (MISCELLANEOUS) IMPLANT
NDL HYPO 25X1 1.5 SAFETY (NEEDLE) ×1 IMPLANT
NDL SAFETY ECLIP 18X1.5 (MISCELLANEOUS) IMPLANT
NEEDLE HYPO 25X1 1.5 SAFETY (NEEDLE) ×1 IMPLANT
NS IRRIG 1000ML POUR BTL (IV SOLUTION) IMPLANT
PACK BASIN DAY SURGERY FS (CUSTOM PROCEDURE TRAY) ×1 IMPLANT
PENCIL SMOKE EVACUATOR (MISCELLANEOUS) ×1 IMPLANT
SLEEVE SCD COMPRESS KNEE MED (STOCKING) ×1 IMPLANT
SPIKE FLUID TRANSFER (MISCELLANEOUS) IMPLANT
SPONGE T-LAP 18X18 ~~LOC~~+RFID (SPONGE) ×1 IMPLANT
SUT MON AB 4-0 PC3 18 (SUTURE) ×2 IMPLANT
SUT SILK 2 0 SH (SUTURE) IMPLANT
SUT VICRYL 3-0 CR8 SH (SUTURE) ×1 IMPLANT
SYR CONTROL 10ML LL (SYRINGE) ×1 IMPLANT
TOWEL GREEN STERILE FF (TOWEL DISPOSABLE) ×1 IMPLANT
TRACER MAGTRACE VIAL (MISCELLANEOUS) IMPLANT
TRAY FAXITRON CT DISP (TRAY / TRAY PROCEDURE) ×1 IMPLANT
TUBE CONNECTING 20X1/4 (TUBING) IMPLANT
YANKAUER SUCT BULB TIP NO VENT (SUCTIONS) IMPLANT

## 2022-05-22 NOTE — H&P (Signed)
MRN: [id number] DOB: Mar 16, 1951 Subjective   Chief Complaint: Breast Cancer   History of Present Illness: Lisa Morrison is a 71 y.o. female who is seen today as an office consultation for evaluation of Breast Cancer .   The patient is a 71 year old female who recently presented after a routine screening mammogram found a distortion in the upper inner quadrant of the left breast. This measured 6 mm. The axilla looked negative. 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 She is otherwise in good health other than some asthma and arthritis. She does not smoke.  Review of Systems: A complete review of systems was obtained from the patient. I have reviewed this information and discussed as appropriate with the patient. See HPI as well for other ROS.  ROS   Medical History: Past Medical History:  Diagnosis Date  Anxiety  Arthritis  History of cancer   Patient Active Problem List  Diagnosis  Malignant neoplasm of upper-inner quadrant of left breast in female, estrogen receptor positive (CMS-HCC)  Arthritis of left knee  Anxiety   Past Surgical History:  Procedure Laterality Date  Back Surgery N/A  2015  HERNIA REPAIR    Allergies  Allergen Reactions  Amoxicillin-Pot Clavulanate Rash  Did it involve swelling of the face/tongue/throat, SOB, or low BP? Yes Did it involve sudden or severe rash/hives, skin peeling, or any reaction on the inside of your mouth or nose? Yes Did you need to seek medical attention at a hospital or doctor's office? Yes When did it last happen? Over 10 years ago If all above answers are "NO", may proceed with cephalosporin use.  Meperidine Other (See Comments) and Swelling  Other Reaction: CARDIAC ARREST  Lowers blood pressure   "DEMEROL"   Current Outpatient Medications on File Prior to Visit  Medication Sig Dispense Refill  potassium chloride  (MICRO-K) 10 MEQ ER capsule Take 2 capsules by mouth once daily  pregabalin (LYRICA) 150 MG capsule Take 1 capsule by mouth at bedtime  topiramate (TOPAMAX) 50 MG tablet Take 1 tablet by mouth at bedtime  triamterene-hydroCHLOROthiazide (DYAZIDE) 37.5-25 mg capsule Take by mouth  omalizumab (XOLAIR) 150 mg vial Inject subcutaneously  [START ON 08/02/2022] semaglutide (WEGOVY) 2.4 mg/0.75 mL pen injector Inject subcutaneously   No current facility-administered medications on file prior to visit.   Family History  Problem Relation Age of Onset  High blood pressure (Hypertension) Father  Hyperlipidemia (Elevated cholesterol) Father  Diabetes Father    Social History   Tobacco Use  Smoking Status Never  Smokeless Tobacco Never    Social History   Socioeconomic History  Marital status: Married  Tobacco Use  Smoking status: Never  Smokeless tobacco: Never  Vaping Use  Vaping Use: Never used  Substance and Sexual Activity  Alcohol use: Defer  Drug use: Defer   Objective:  There were no vitals filed for this visit.  There is no height or weight on file to calculate BMI.  Physical Exam Vitals reviewed.  Constitutional:  General: She is not in acute distress. Appearance: Normal appearance.  HENT:  Head: Normocephalic and atraumatic.  Right Ear: External ear normal.  Left Ear: External ear normal.  Nose: Nose normal.  Mouth/Throat:  Mouth: Mucous membranes are moist.  Pharynx: Oropharynx is clear.  Eyes:  General: No scleral icterus. Extraocular Movements: Extraocular movements intact.  Conjunctiva/sclera: Conjunctivae normal.  Pupils: Pupils  are equal, round, and reactive to light.  Cardiovascular:  Rate and Rhythm: Normal rate and regular rhythm.  Pulses: Normal pulses.  Heart sounds: Normal heart sounds.  Pulmonary:  Effort: Pulmonary effort is normal. No respiratory distress.  Breath sounds: Normal breath sounds.  Abdominal:  General: Bowel sounds are  normal.  Palpations: Abdomen is soft.  Tenderness: There is no abdominal tenderness.  Musculoskeletal:  General: No swelling, tenderness or deformity. Normal range of motion.  Cervical back: Normal range of motion and neck supple.  Skin: General: Skin is warm and dry.  Coloration: Skin is not jaundiced.  Neurological:  General: No focal deficit present.  Mental Status: She is alert and oriented to person, place, and time.  Psychiatric:  Mood and Affect: Mood normal.  Behavior: Behavior normal.    Breast: There is no palpable mass in either breast. There is no palpable axillary, supraclavicular, or cervical lymphadenopathy.  Labs, Imaging and Diagnostic Testing:  Assessment and Plan:   Diagnoses and all orders for this visit:  Malignant neoplasm of upper-inner quadrant of left breast in female, estrogen receptor positive (CMS-HCC) - Ambulatory Referral to Oncology-Medical - Ambulatory Referral to Radiation Oncology - Ambulatory Referral to Physical Therapy - [facility] Case Posting Request; Future    The patient appears to have a 6 mm invasive ductal cancer in the upper inner quadrant of the left breast with clinically negative nodes and all favorable markers. I have discussed with her in detail the different options for treatment and at this point she favors breast conservation which I feel is very reasonable. She will be a good candidate for sentinel node biopsy as well. I have discussed with her in detail the risks and benefits of the operation as well as some of the technical aspects including the use of a radioactive seed for localization and she understands and wishes to proceed. She will meet with medical and radiation oncology to discuss adjuvant therapy. I will also get a preoperative anesthesia consult since she has had some issues with anesthesia in the past.

## 2022-05-22 NOTE — Discharge Instructions (Signed)
May take NSAIDS (ibuprofen, motrin) after 4pm, if needed.    Post Anesthesia Home Care Instructions  Activity: Get plenty of rest for the remainder of the day. A responsible individual must stay with you for 24 hours following the procedure.  For the next 24 hours, DO NOT: -Drive a car -Paediatric nurse -Drink alcoholic beverages -Take any medication unless instructed by your physician -Make any legal decisions or sign important papers.  Meals: Start with liquid foods such as gelatin or soup. Progress to regular foods as tolerated. Avoid greasy, spicy, heavy foods. If nausea and/or vomiting occur, drink only clear liquids until the nausea and/or vomiting subsides. Call your physician if vomiting continues.  Special Instructions/Symptoms: Your throat may feel dry or sore from the anesthesia or the breathing tube placed in your throat during surgery. If this causes discomfort, gargle with warm salt water. The discomfort should disappear within 24 hours.  If you had a scopolamine patch placed behind your ear for the management of post- operative nausea and/or vomiting:  1. The medication in the patch is effective for 72 hours, after which it should be removed.  Wrap patch in a tissue and discard in the trash. Wash hands thoroughly with soap and water. 2. You may remove the patch earlier than 72 hours if you experience unpleasant side effects which may include dry mouth, dizziness or visual disturbances. 3. Avoid touching the patch. Wash your hands with soap and water after contact with the patch.

## 2022-05-22 NOTE — Anesthesia Postprocedure Evaluation (Signed)
Anesthesia Post Note  Patient: Lisa Morrison  Procedure(s) Performed: LEFT BREAST LUMPECTOMY WITH RADIOACTIVE SEED AND SENTINEL LYMPH NODE BIOPSY (Left: Breast)     Patient location during evaluation: Phase II Anesthesia Type: General Level of consciousness: awake and alert, patient cooperative and oriented Pain management: pain level controlled Vital Signs Assessment: post-procedure vital signs reviewed and stable Respiratory status: spontaneous breathing, nonlabored ventilation and respiratory function stable Cardiovascular status: blood pressure returned to baseline and stable Postop Assessment: no apparent nausea or vomiting, adequate PO intake and able to ambulate Anesthetic complications: no   No notable events documented.  Last Vitals:  Vitals:   05/22/22 1258 05/22/22 1308  BP: 114/64 (!) 122/94  Pulse: 64 67  Resp: 14 16  Temp:  (!) 36.1 C  SpO2: 96% 99%    Last Pain:  Vitals:   05/22/22 1308  TempSrc:   PainSc: 5                  Tava Peery,E. Westley Blass

## 2022-05-22 NOTE — Op Note (Signed)
05/22/2022  12:28 PM  PATIENT:  Lisa Morrison  71 y.o. female  PRE-OPERATIVE DIAGNOSIS:  LEFT BREAST CANCER  POST-OPERATIVE DIAGNOSIS:  LEFT BREAST CANCER  PROCEDURE:  Procedure(s): LEFT BREAST LUMPECTOMY WITH RADIOACTIVE SEED LOCALIZATION AND DEEP LEFT AXILLARY SENTINEL LYMPH NODE BIOPSY (Left)  SURGEON:  Surgeon(s) and Role:    * Jovita Kussmaul, MD - Primary  PHYSICIAN ASSISTANT:   ASSISTANTS: none   ANESTHESIA:   local and general  EBL:  25 mL   BLOOD ADMINISTERED:none  DRAINS: none   LOCAL MEDICATIONS USED:  MARCAINE     SPECIMEN:  Source of Specimen:  left breast tissue with additional anterior and superior margins and sentinel node   DISPOSITION OF SPECIMEN:  PATHOLOGY  COUNTS:  YES  TOURNIQUET:  * No tourniquets in log *  DICTATION: .Dragon Dictation  After informed consent was obtained the patient was brought to the operating room and placed in the supine position on the operating table.  After adequate induction of general anesthesia the patient's left chest, breast, and axillary area were prepped with ChloraPrep, allowed to dry, and draped in usual sterile manner.  An appropriate timeout was performed.  Previously an I-125 seed was placed in the upper left breast to mark an area of invasive breast cancer.  At this point, 2 cc of iron oxide were injected into the subareolar plexus of the left breast and the breast was massaged for several minutes.  Attention was first turned to the breast itself.  The neoprobe was set to I-125 in the area of radioactivity was readily identified in the upper portion of the left breast.  The area around this was infiltrated with quarter percent Marcaine.  A curvilinear incision was made along the upper edge of the areola of the left breast with a 15 blade knife.  The incision was carried through the skin and subcutaneous tissue sharply with the electrocautery.  Dissection was carried superiorly between the breast tissue and the  subcutaneous fat and skin.  Once the dissection was well beyond the area of the cancer I then removed a circular portion of breast tissue sharply with the electrocautery around the radioactive seed while checking the area of radioactivity frequently.  Once the specimen was removed it was oriented with the appropriate paint colors.  A specimen radiograph was obtained that showed the clip and seed to be within the specimen.  I did elect to take an additional anterior margin which also contained some inferior breast tissue as well as the superior margin.  These were marked appropriately and all the tissue was sent to pathology for further evaluation.  Hemostasis was achieved using the Bovie electrocautery.  The wound was irrigated with saline and infiltrated with more quarter percent Marcaine.  The cavity was marked with clips.  The deep layer of the incision was then closed with layers of interrupted 3-0 Vicryl stitches.  The skin was closed with interrupted 4-0 Monocryl subcuticular stitches.  Attention was then turned to the left axilla.  The Sentimag machine was used to identify a signal in the left axilla.  The area overlying this was infiltrated with quarter percent Marcaine.  A small transversely oriented incision was made with a 15 blade knife overlying the area of signal.  The incision was carried through the skin and subcutaneous tissue sharply with the electrocautery until the deep left axillary space was entered.  I was able to identify a lymph node with signal.  This lymph node was excised  sharply with the electrocautery and the surrounding small vessels and lymphatics were controlled with clips.  Some additional left axillary tissue was also sent as well.  No other hot or palpable nodes were identified in the left axilla.  Hemostasis was achieved using the Bovie electrocautery.  The deep layer of the incision was closed with interrupted 3-0 Vicryl stitches.  The skin was then closed with a running 4-0  Monocryl subcuticular stitch.  Dermabond dressings were applied.  The patient tolerated the procedure well.  At the end of the case all needle sponge and instrument counts were correct.  The patient was then awakened and taken to recovery in stable condition.  PLAN OF CARE: Discharge to home after PACU  PATIENT DISPOSITION:  PACU - hemodynamically stable.   Delay start of Pharmacological VTE agent (>24hrs) due to surgical blood loss or risk of bleeding: not applicable

## 2022-05-22 NOTE — Interval H&P Note (Signed)
History and Physical Interval Note:  05/22/2022 10:30 AM  Lisa Morrison  has presented today for surgery, with the diagnosis of LEFT BREAST CANCER.  The various methods of treatment have been discussed with the patient and family. After consideration of risks, benefits and other options for treatment, the patient has consented to  Procedure(s): LEFT BREAST LUMPECTOMY WITH RADIOACTIVE SEED AND SENTINEL LYMPH NODE BIOPSY (Left) as a surgical intervention.  The patient's history has been reviewed, patient examined, no change in status, stable for surgery.  I have reviewed the patient's chart and labs.  Questions were answered to the patient's satisfaction.     Autumn Messing III

## 2022-05-22 NOTE — Anesthesia Preprocedure Evaluation (Addendum)
Anesthesia Evaluation  Patient identified by MRN, date of birth, ID band Patient awake    Reviewed: Allergy & Precautions, NPO status , Patient's Chart, lab work & pertinent test results  History of Anesthesia Complications (+) PONV and history of anesthetic complications  Airway Mallampati: II  TM Distance: >3 FB Neck ROM: Full    Dental  (+) Dental Advisory Given   Pulmonary COPD,  COPD inhaler,    breath sounds clear to auscultation       Cardiovascular hypertension, Pt. on medications (-) angina Rhythm:Regular Rate:Normal     Neuro/Psych  Headaches, Anxiety Depression    GI/Hepatic negative GI ROS, Neg liver ROS,   Endo/Other  BMI 31.6  Renal/GU negative Renal ROS     Musculoskeletal  (+) Arthritis , Fibromyalgia -  Abdominal (+) + obese,   Peds  Hematology negative hematology ROS (+)   Anesthesia Other Findings Breast cancer  Reproductive/Obstetrics                            Anesthesia Physical Anesthesia Plan  ASA: 2  Anesthesia Plan: General   Post-op Pain Management: Tylenol PO (pre-op)* and Regional block*   Induction: Intravenous  PONV Risk Score and Plan: 4 or greater and Ondansetron, Dexamethasone, Scopolamine patch - Pre-op and Treatment may vary due to age or medical condition  Airway Management Planned: LMA  Additional Equipment: None  Intra-op Plan:   Post-operative Plan:   Informed Consent: I have reviewed the patients History and Physical, chart, labs and discussed the procedure including the risks, benefits and alternatives for the proposed anesthesia with the patient or authorized representative who has indicated his/her understanding and acceptance.     Dental advisory given  Plan Discussed with: CRNA and Surgeon  Anesthesia Plan Comments: (Plan routine monitors, GA with pectoralis block for post op analgesia)       Anesthesia Quick  Evaluation

## 2022-05-22 NOTE — Transfer of Care (Signed)
Immediate Anesthesia Transfer of Care Note  Patient: Lisa Morrison  Procedure(s) Performed: LEFT BREAST LUMPECTOMY WITH RADIOACTIVE SEED AND SENTINEL LYMPH NODE BIOPSY (Left: Breast)  Patient Location: PACU  Anesthesia Type:General  Level of Consciousness: awake, alert  and oriented  Airway & Oxygen Therapy: Patient Spontanous Breathing and Patient connected to nasal cannula oxygen  Post-op Assessment: Report given to RN and Post -op Vital signs reviewed and stable  Post vital signs: Reviewed and stable  Last Vitals:  Vitals Value Taken Time  BP    Temp 36.2 C 05/22/22 1235  Pulse    Resp    SpO2      Last Pain:  Vitals:   05/22/22 0952  TempSrc: Oral  PainSc: 0-No pain         Complications: No notable events documented.

## 2022-05-22 NOTE — Anesthesia Procedure Notes (Signed)
Anesthesia Regional Block: Pectoralis block   Pre-Anesthetic Checklist: , timeout performed,  Correct Patient, Correct Site, Correct Laterality,  Correct Procedure, Correct Position, site marked,  Risks and benefits discussed,  Surgical consent,  Pre-op evaluation,  At surgeon's request and post-op pain management  Laterality: Left  Prep: chloraprep       Needles:  Injection technique: Single-shot  Needle Type: Echogenic Needle     Needle Length: 9cm  Needle Gauge: 21     Additional Needles:   Procedures:,,,, ultrasound used (permanent image in chart),,    Narrative:  Start time: 05/22/2022 10:41 AM End time: 05/22/2022 10:47 AM Injection made incrementally with aspirations every 5 mL.  Performed by: Personally  Anesthesiologist: Annye Asa, MD  Additional Notes: Pt identified in Holding room.  Monitors applied. Working IV access confirmed. Sterile prep L clavicle and pec.  #21ga ECHOgenic Arrow block needle between ribs 3, 4, pec minor and serratus, then pec major and pec minor.  Total 30cc 0.75% Bupivacaine 1:200k epi injected incrementally after negative test dose, good planar spread of local anesthetic.  Patient asymptomatic, VSS, no heme aspirated, tolerated well.   Jenita Seashore, MD

## 2022-05-22 NOTE — Progress Notes (Signed)
Assisted Dr. Carswell Jackson with left, pectoralis, ultrasound guided block. Side rails up, monitors on throughout procedure. See vital signs in flow sheet. Tolerated Procedure well. 

## 2022-05-22 NOTE — Anesthesia Procedure Notes (Signed)
Procedure Name: LMA Insertion Date/Time: 05/22/2022 11:02 AM  Performed by: Bufford Spikes, CRNAPre-anesthesia Checklist: Patient identified, Emergency Drugs available, Suction available and Patient being monitored Patient Re-evaluated:Patient Re-evaluated prior to induction Oxygen Delivery Method: Circle system utilized Preoxygenation: Pre-oxygenation with 100% oxygen Induction Type: IV induction Ventilation: Mask ventilation without difficulty LMA: LMA inserted LMA Size: 4.0 Number of attempts: 1 Placement Confirmation: positive ETCO2 Tube secured with: Tape Dental Injury: Teeth and Oropharynx as per pre-operative assessment

## 2022-05-25 ENCOUNTER — Encounter (HOSPITAL_BASED_OUTPATIENT_CLINIC_OR_DEPARTMENT_OTHER): Payer: Self-pay | Admitting: General Surgery

## 2022-05-25 LAB — SURGICAL PATHOLOGY

## 2022-05-25 NOTE — Progress Notes (Signed)
Left message stating courtesy call and if any questions or concerns please call the doctors office.  

## 2022-05-28 ENCOUNTER — Encounter: Payer: Self-pay | Admitting: *Deleted

## 2022-06-01 ENCOUNTER — Ambulatory Visit: Payer: Medicare PPO

## 2022-06-01 ENCOUNTER — Other Ambulatory Visit: Payer: Self-pay

## 2022-06-01 ENCOUNTER — Encounter: Payer: Self-pay | Admitting: Hematology and Oncology

## 2022-06-01 ENCOUNTER — Inpatient Hospital Stay: Payer: Medicare PPO | Attending: Hematology and Oncology | Admitting: Hematology and Oncology

## 2022-06-01 DIAGNOSIS — C50212 Malignant neoplasm of upper-inner quadrant of left female breast: Secondary | ICD-10-CM | POA: Insufficient documentation

## 2022-06-01 DIAGNOSIS — Z170 Estrogen receptor positive status [ER+]: Secondary | ICD-10-CM | POA: Diagnosis not present

## 2022-06-01 NOTE — Assessment & Plan Note (Signed)
This is a very pleasant 71 year old postmenopausal female patient with newly diagnosed left breast grade 1 IDC, ER/PR strongly positive, HER2 negative by IHC 1+, low proliferation index Ki-67 of less than 1% referred to breast [place] for additional recommendations.  Given strong ER/PR positivity, low-grade and low proliferation index, I do not see any indication for Oncotype testing.   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  We have previously and today discussed about likely no role for Oncotype or adjuvant chemotherapy.  She is now going to pursue adjuvant radiation but has an appointment with radiation oncology team to discuss options.  Following adjuvant radiation, she will start antiestrogen therapy.  She is not quite sure about the choice of antiestrogen therapy hence I have discussed once again about tamoxifen versus aromatase inhibitors, mechanism of action for each class as well as adverse effects with each class.  At the end of the conversation, she was comfortable with either of antiestrogen therapy.  She will return to clinic after radiation.  Thank you for consulting [place] the care of this patient.  Please not hesitate contact us with any additional questions or concerns

## 2022-06-01 NOTE — Progress Notes (Signed)
[facility] NOTE  Patient Care Team: Shelda Pal, DO as PCP - General (Family Medicine) Jovita Kussmaul, MD as Consulting Physician (General Surgery) Benay Pike, MD as Consulting Physician (Hematology and Oncology) Eppie Gibson, MD as Attending Physician (Radiation Oncology) Mauro Kaufmann, RN as Oncology Nurse Navigator Rockwell Germany, RN as Oncology Nurse Navigator  CHIEF COMPLAINTS/PURPOSE OF CONSULTATION:  Newly diagnosed breast cancer  HISTORY OF PRESENTING ILLNESS:  Lisa Morrison 71 y.o. female is here because of recent diagnosis of left breast cancer  I reviewed her records extensively and collaborated the history with the patient.  SUMMARY OF ONCOLOGIC HISTORY: Oncology History  Malignant neoplasm of upper-inner quadrant of left breast in female, estrogen receptor positive ([place])  04/08/2022 Mammogram   Screening mammogram shows distortion in the left breast warranting further evaluation.  No findings suspicious for malignancy in the right breast.  Left breast diagnostic mammogram confirmed a 6 mm mass in the 11 o'clock position of the left breast with imaging features highly suspicious for malignancy.  Ultrasound of the left axilla demonstrated normal-appearing left axillary lymph nodes   04/27/2022 Initial Diagnosis   Malignant neoplasm of upper-inner quadrant of left breast in female, estrogen receptor positive [facility])    Pathology Results   Pathology from the left breast biopsy showed low-grade invasive well-differentiated ductal adenocarcinoma along with low-grade DCIS showing ER 100% positive strong staining PR 100% positive strong staining, Ki-67 of less than 1% and HER2 negative 1+ by [facility]   05/22/2022 Pathology Results   She had left breast lumpectomy which showed 0.7 cm grade 1 invasive ductal carcinoma, low-grade DCIS, all margins negative for invasive carcinoma, all lymph nodes negative for metastatic carcinoma.    Interval  History  Patient is here for follow-up after surgery.  She will be meeting radiation oncology team soon as well.  She denies any new health complaints today.  She is understandably worried about having to take antiestrogen therapy. She is not quite sure which when she would like to try for antiestrogen therapy Rest of the pertinent 10 point ROS reviewed and negative  MEDICAL HISTORY:  Past Medical History:  Diagnosis Date   Anxiety    Arthritis    Breast cancer ([place])    CFS (chronic fatigue syndrome)    Depression    denies now   Dyspnea    Edema    due to taking lyrica   Family history of adverse reaction to anesthesia    son had n/v   Family history of pancreatic cancer 05/03/2022   Fibromyalgia    IBS (irritable bowel syndrome)    Leaky heart valve    Migraine    Orthostatic hypotension    from a  previous surgery    PONV (postoperative nausea and vomiting)    and heart rate goes down " I fainted after surgery when standing in the restroom."   Seasonal allergies    Spondylolisthesis of lumbar region    Wears glasses     SURGICAL HISTORY: Past Surgical History:  Procedure Laterality Date   ABDOMINAL HYSTERECTOMY     back ablasion     03/2018 and 05/2018   back stimulator  2022   BACK SURGERY  2015   BREAST LUMPECTOMY WITH RADIOACTIVE SEED AND SENTINEL LYMPH NODE BIOPSY Left 05/22/2022   Procedure: LEFT BREAST LUMPECTOMY WITH RADIOACTIVE SEED AND SENTINEL LYMPH NODE BIOPSY;  Surgeon: Jovita Kussmaul, MD;  Location: [facility];  Service: General;  Laterality: Left;   CHOLECYSTECTOMY     HERNIA REPAIR     x2   OOPHORECTOMY     VARICOSE VEIN SURGERY      SOCIAL HISTORY: Social History   Socioeconomic History   Marital status: Married    Spouse name: Not on file   Number of children: Not on file   Years of education: Not on file   Highest education level: Not on file  Occupational History   Not on file  Tobacco Use   Smoking status: Never     Passive exposure: Past   Smokeless tobacco: Never  Vaping Use   Vaping Use: Never used  Substance and Sexual Activity   Alcohol use: Never   Drug use: Never   Sexual activity: Not on file  Other Topics Concern   Not on file  Social History Narrative   Not on file   Social Determinants of Health   Financial Resource Strain: Low Risk  (05/29/2021)   Overall Financial Resource Strain (CARDIA)    Difficulty of Paying Living Expenses: Not hard at all  Food Insecurity: No Food Insecurity (05/29/2021)   Hunger Vital Sign    Worried About Running Out of Food in the Last Year: Never true    [place] in the Last Year: Never true  Transportation Needs: No Transportation Needs (05/29/2021)   PRAPARE - Hydrologist (Medical): No    Lack of Transportation (Non-Medical): No  Physical Activity: Inactive (05/29/2021)   Exercise Vital Sign    Days of Exercise per Week: 0 days    Minutes of Exercise per Session: 0 min  Stress: No Stress Concern Present (05/29/2021)   [place]    Feeling of Stress : Not at all  Social Connections: Moderately Integrated (05/29/2021)   Social Connection and Isolation Panel [NHANES]    Frequency of Communication with Friends and Family: More than three times a week    Frequency of Social Gatherings with Friends and Family: More than three times a week    Attends Religious Services: Never    Marine scientist or Organizations: Yes    Attends Archivist Meetings: 1 to 4 times per year    Marital Status: Married  Human resources officer Violence: Not At Risk (05/29/2021)   Humiliation, Afraid, Rape, and Kick questionnaire    Fear of Current or Ex-Partner: No    Emotionally Abused: No    Physically Abused: No    Sexually Abused: No    FAMILY HISTORY: Family History  Problem Relation Age of Onset   Mitral valve prolapse Mother    Coronary aneurysm Father     Diabetes Father    Pancreatic cancer Maternal Uncle 25   Healthy Son     ALLERGIES:  is allergic to augmentin [amoxicillin-pot clavulanate], demerol [meperidine], amoxicillin, and clavulanic acid.  MEDICATIONS:  Current Outpatient Medications  Medication Sig Dispense Refill   acetaminophen (TYLENOL) 650 MG CR tablet Take 1,300 mg by mouth 2 (two) times daily as needed.     ADVAIR HFA 230-21 MCG/ACT inhaler Inhale 2 puffs into the lungs 2 (two) times daily.     buPROPion (WELLBUTRIN SR) 150 MG 12 hr tablet TAKE 1 TABLET BY MOUTH TWICE A DAY 180 tablet 1   Calcium Carbonate-Vit D-Min (CALTRATE 600+D PLUS MINERALS) 600-800 MG-UNIT TABS Take by mouth.     clonazePAM (KLONOPIN) 0.5 MG tablet TAKE 1/2 TABLETS  BY MOUTH 2 TIMES DAILY AS NEEDED FOR ANXIETY. 30 tablet 2   EPIPEN 2-PAK 0.3 MG/0.3ML SOAJ injection      levocetirizine (XYZAL) 5 MG tablet      meloxicam (MOBIC) 7.5 MG tablet      montelukast (SINGULAIR) 10 MG tablet Take by mouth.     nortriptyline (PAMELOR) 10 MG capsule TAKE 1-2 CAPSULES (10-20 MG TOTAL) BY MOUTH AT BEDTIME. 180 capsule 2   Omalizumab (XOLAIR [place]) Inject into the skin every 30 (thirty) days.     oxyCODONE (ROXICODONE) 5 MG immediate release tablet Take 1 tablet (5 mg total) by mouth every 6 (six) hours as needed for severe pain. 10 tablet 0   potassium chloride (MICRO-K) 10 MEQ CR capsule TAKE 2 CAPSULES BY MOUTH EVERY DAY 180 capsule 1   pregabalin (LYRICA) 150 MG capsule TAKE 1 CAPSULE BY MOUTH EVERY NIGHT 90 capsule 1   Prucalopride Succinate (MOTEGRITY) 2 MG TABS      topiramate (TOPAMAX) 50 MG tablet TAKE 1 TABLET BY MOUTH TWICE DAILY. 180 tablet 1   triamterene-hydrochlorothiazide (DYAZIDE) 37.5-25 MG capsule TAKE 1 EACH (1 CAPSULE TOTAL) BY MOUTH DAILY. 90 capsule 1   No current facility-administered medications for this visit.    REVIEW OF SYSTEMS:   Constitutional: Denies fevers, chills or abnormal night sweats Eyes: Denies blurriness of vision,  double vision or watery eyes Ears, nose, mouth, throat, and face: Denies mucositis or sore throat Respiratory: Denies cough, dyspnea or wheezes Cardiovascular: Denies palpitation, chest discomfort or lower extremity swelling Gastrointestinal:  Denies nausea, heartburn or change in bowel habits Skin: Denies abnormal skin rashes Lymphatics: Denies new lymphadenopathy or easy bruising Neurological:Denies numbness, tingling or new weaknesses Behavioral/Psych: Mood is stable, no new changes  Breast: Denies any palpable lumps or discharge All other systems were reviewed with the patient and are negative.  PHYSICAL EXAMINATION: ECOG PERFORMANCE STATUS: 0 - Asymptomatic  Vitals:   06/01/22 0910  BP: 138/68  Pulse: 77  Resp: 18  Temp: (!) 97.5 F (36.4 C)  SpO2: 99%    Filed Weights   06/01/22 0910  Weight: 186 lb 6.4 oz (84.6 kg)    General appearance: Alert oriented and in no acute distress Rest of the physical exam deferred in lieu of counseling  LABORATORY DATA:  I have reviewed the data as listed Lab Results  Component Value Date   WBC 6.0 04/29/2022   HGB 15.5 (H) 04/29/2022   HCT 44.7 04/29/2022   MCV 89.9 04/29/2022   PLT 269 04/29/2022   Lab Results  Component Value Date   NA 137 04/29/2022   K 3.1 (L) 04/29/2022   CL 100 04/29/2022   CO2 30 04/29/2022    RADIOGRAPHIC STUDIES: I have personally reviewed the radiological reports and agreed with the findings in the report.  ASSESSMENT AND PLAN:  Malignant neoplasm of upper-inner quadrant of left breast in female, estrogen receptor positive ([place]) This is a very pleasant 71 year old postmenopausal female patient with newly diagnosed left breast grade 1 IDC, ER/PR strongly positive, HER2 negative by IHC 1+, low proliferation index Ki-67 of less than 1% referred to breast [place] for additional recommendations.  Given strong ER/PR positivity, low-grade and low proliferation index, I do not see any indication for Oncotype  testing.   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  We have previously and today discussed about likely no role for Oncotype or adjuvant  chemotherapy.  She is now going to pursue adjuvant radiation but has an appointment with radiation oncology team to discuss options.  Following adjuvant radiation, she will start antiestrogen therapy.  She is not quite sure about the choice of antiestrogen therapy hence I have discussed once again about tamoxifen versus aromatase inhibitors, mechanism of action for each class as well as adverse effects with each class.  At the end of the conversation, she was comfortable with either of antiestrogen therapy.  She will return to clinic after radiation.  Thank you for consulting [place] the care of this patient.  Please not hesitate contact us with any additional questions or concerns Total time spent: 30 minutes including history,  review of records, counseling and coordination of care  All questions were answered. The patient knows to call the clinic with any problems, questions or concerns.    Benay Pike, MD 06/01/22

## 2022-06-03 ENCOUNTER — Telehealth: Payer: Self-pay | Admitting: *Deleted

## 2022-06-03 DIAGNOSIS — J3089 Other allergic rhinitis: Secondary | ICD-10-CM | POA: Diagnosis not present

## 2022-06-03 DIAGNOSIS — J3081 Allergic rhinitis due to animal (cat) (dog) hair and dander: Secondary | ICD-10-CM | POA: Diagnosis not present

## 2022-06-03 DIAGNOSIS — J301 Allergic rhinitis due to pollen: Secondary | ICD-10-CM | POA: Diagnosis not present

## 2022-06-03 NOTE — Telephone Encounter (Signed)
Pt called to this RN to state concern she noted when she reviewed her labs that her heme was slightly elevated " though just a little - but because Dr Chryl Heck is a hematologist and I also had labs done by a Rheumatologist this past summer I would like her to review those - because I was told I just had general arthritis "  Per further discussion with this RN- pt stating she is concerned her abnormal could indicate " that my cancer is worse then we think"  This RN informed her her concerns would be given to MD for review.  Note labs are in Epic from Rheumatology.

## 2022-06-05 ENCOUNTER — Ambulatory Visit (INDEPENDENT_AMBULATORY_CARE_PROVIDER_SITE_OTHER): Payer: Medicare PPO | Admitting: *Deleted

## 2022-06-05 DIAGNOSIS — Z0000 Encounter for general adult medical examination without abnormal findings: Secondary | ICD-10-CM

## 2022-06-05 NOTE — Progress Notes (Signed)
Subjective:   Lisa Morrison is a 71 y.o. female who presents for Medicare Annual (Subsequent) preventive examination.  I connected with  NICOLINA HIRT on 06/05/22 by a audio enabled telemedicine application and verified that I am speaking with the correct person using two identifiers.  Patient Location: Home  Provider Location: Office/Clinic  I discussed the limitations of evaluation and management by telemedicine. The patient expressed understanding and agreed to proceed.   Review of Systems    Defer to PCP Cardiac Risk Factors include: advanced age (>20mn, >>61women);dyslipidemia;hypertension     Objective:    There were no vitals filed for this visit. There is no height or weight on file to calculate BMI.     06/05/2022    9:49 AM 05/22/2022    9:56 AM 05/15/2022    3:35 PM 04/29/2022    2:11 PM 05/29/2021    9:49 AM 03/20/2019    5:42 AM 03/16/2019   10:34 AM  Advanced Directives  Does Patient Have a Medical Advance Directive? Yes Yes Yes Yes Yes Yes Yes  Type of AParamedicof [place]Living will Healthcare Power of [place]av[place]Living will Healthcare Power of AMehamaLiving will HGreensvilleLiving will HEnterpriseLiving will  Does patient want to make changes to medical advance directive? No - Patient declined No - Patient declined  No - Patient declined   No - Patient declined  Copy of [place]in Chart? No - copy requested   No - copy requested No - copy requested No - copy requested No - copy requested    Current Medications (verified) Outpatient Encounter Medications as of 06/05/2022  Medication Sig   acetaminophen (TYLENOL) 650 MG CR tablet Take 1,300 mg by mouth 2 (two) times daily as needed.   ADVAIR HFA 230-21 MCG/ACT inhaler Inhale 2 puffs into the lungs 2 (two) times daily.   buPROPion (WELLBUTRIN SR) 150 MG 12 hr tablet TAKE 1 TABLET  BY MOUTH TWICE A DAY   Calcium Carbonate-Vit D-Min (CALTRATE 600+D PLUS MINERALS) 600-800 MG-UNIT TABS Take by mouth.   EPIPEN 2-PAK 0.3 MG/0.3ML SOAJ injection    levocetirizine (XYZAL) 5 MG tablet    meloxicam (MOBIC) 7.5 MG tablet    montelukast (SINGULAIR) 10 MG tablet Take by mouth.   nortriptyline (PAMELOR) 10 MG capsule TAKE 1-2 CAPSULES (10-20 MG TOTAL) BY MOUTH AT BEDTIME.   potassium chloride (MICRO-K) 10 MEQ CR capsule TAKE 2 CAPSULES BY MOUTH EVERY DAY   pregabalin (LYRICA) 150 MG capsule TAKE 1 CAPSULE BY MOUTH EVERY NIGHT   Prucalopride Succinate (MOTEGRITY) 2 MG TABS    topiramate (TOPAMAX) 50 MG tablet TAKE 1 TABLET BY MOUTH TWICE DAILY.   triamterene-hydrochlorothiazide (DYAZIDE) 37.5-25 MG capsule TAKE 1 EACH (1 CAPSULE TOTAL) BY MOUTH DAILY.   [DISCONTINUED] Omalizumab (XOLAIR [place]) Inject into the skin every 30 (thirty) days.   [DISCONTINUED] oxyCODONE (ROXICODONE) 5 MG immediate release tablet Take 1 tablet (5 mg total) by mouth every 6 (six) hours as needed for severe pain.   No facility-administered encounter medications on file as of 06/05/2022.    Allergies (verified) Augmentin [amoxicillin-pot clavulanate], Demerol [meperidine], Amoxicillin, and Clavulanic acid   History: Past Medical History:  Diagnosis Date   Anxiety    Arthritis    Breast cancer ([place]    CFS (chronic fatigue syndrome)    Depression    denies now   Dyspnea  Edema    due to taking lyrica   Family history of adverse reaction to anesthesia    son had n/v   Family history of pancreatic cancer 05/03/2022   Fibromyalgia    IBS (irritable bowel syndrome)    Leaky heart valve    Migraine    Orthostatic hypotension    from a  previous surgery    PONV (postoperative nausea and vomiting)    and heart rate goes down " I fainted after surgery when standing in the restroom."   Seasonal allergies    Spondylolisthesis of lumbar region    Wears glasses    Past Surgical History:  Procedure  Laterality Date   ABDOMINAL HYSTERECTOMY     back ablasion     03/2018 and 05/2018   back stimulator  2022   BACK SURGERY  2015   BREAST LUMPECTOMY WITH RADIOACTIVE SEED AND SENTINEL LYMPH NODE BIOPSY Left 05/22/2022   Procedure: LEFT BREAST LUMPECTOMY WITH RADIOACTIVE SEED AND SENTINEL LYMPH NODE BIOPSY;  Surgeon: Jovita Kussmaul, MD;  Location: [facility];  Service: General;  Laterality: Left;   CHOLECYSTECTOMY     HERNIA REPAIR     x2   OOPHORECTOMY     VARICOSE VEIN SURGERY     Family History  Problem Relation Age of Onset   Mitral valve prolapse Mother    Coronary aneurysm Father    Diabetes Father    Pancreatic cancer Maternal Uncle 40   Healthy Son    Social History   Socioeconomic History   Marital status: Married    Spouse name: Not on file   Number of children: Not on file   Years of education: Not on file   Highest education level: Not on file  Occupational History   Not on file  Tobacco Use   Smoking status: Never    Passive exposure: Past   Smokeless tobacco: Never  Vaping Use   Vaping Use: Never used  Substance and Sexual Activity   Alcohol use: Never   Drug use: Never   Sexual activity: Not on file  Other Topics Concern   Not on file  Social History Narrative   Not on file   Social Determinants of Health   Financial Resource Strain: Low Risk  (05/29/2021)   Overall Financial Resource Strain (CARDIA)    Difficulty of Paying Living Expenses: Not hard at all  Food Insecurity: No Food Insecurity (05/29/2021)   Hunger Vital Sign    Worried About Running Out of Food in the Last Year: Never true    Ran Out of Food in the Last Year: Never true  Transportation Needs: No Transportation Needs (05/29/2021)   PRAPARE - Hydrologist (Medical): No    Lack of Transportation (Non-Medical): No  Physical Activity: Inactive (05/29/2021)   Exercise Vital Sign    Days of Exercise per Week: 0 days    Minutes of Exercise per  Session: 0 min  Stress: No Stress Concern Present (05/29/2021)   [place]    Feeling of Stress : Not at all  Social Connections: Moderately Integrated (05/29/2021)   Social Connection and Isolation Panel [NHANES]    Frequency of Communication with Friends and Family: More than three times a week    Frequency of Social Gatherings with Friends and Family: More than three times a week    Attends Religious Services: Never    Active Member of  Clubs or Organizations: Yes    Attends Archivist Meetings: 1 to 4 times per year    Marital Status: Married    Tobacco Counseling Counseling given: Not Answered   Clinical Intake:  Pre-visit preparation completed: Yes  Pain : No/denies pain  Diabetes: No  How often do you need to have someone help you when you read instructions, pamphlets, or other written materials from your doctor or pharmacy?: 1 - Never  Diabetic? No  Activities of Daily Living    06/05/2022    9:54 AM 05/22/2022    9:45 AM  In your present state of health, do you have any difficulty performing the following activities:  Hearing? 0 0  Vision? 1 0  Comment has cataracts   Difficulty concentrating or making decisions? 0 0  Walking or climbing stairs? 0 0  Dressing or bathing? 0 0  Doing errands, shopping? 0   Preparing Food and eating ? N   Using the Toilet? N   In the past six months, have you accidently leaked urine? Y   Comment only with full bladder after diuretic   Do you have problems with loss of bowel control? N   Managing your Medications? N   Managing your Finances? N   Housekeeping or managing your Housekeeping? N     Patient Care Team: Shelda Pal, DO as PCP - General (Family Medicine) Jovita Kussmaul, MD as Consulting Physician (General Surgery) Benay Pike, MD as Consulting Physician (Hematology and Oncology) Eppie Gibson, MD as Attending Physician  (Radiation Oncology) Mauro Kaufmann, RN as Oncology Nurse Navigator Rockwell Germany, RN as Oncology Nurse Navigator  Indicate any recent Medical Services you may have received from other than [facility] providers in the past year (date may be approximate).     Assessment:   This is a routine wellness examination for Jensyn.  Hearing/Vision screen No results found.  Dietary issues and exercise activities discussed: Current Exercise Habits: The patient does not participate in regular exercise at present, Exercise limited by: orthopedic condition(s) (multiple back surgerie)   Goals Addressed   None    Depression Screen    06/05/2022    9:53 AM 04/08/2022    8:05 AM 09/24/2021    8:17 AM 05/29/2021    9:52 AM  PHQ 2/9 Scores  PHQ - 2 Score 0 0 0 0  PHQ- 9 Score  1      Fall Risk    06/05/2022    9:53 AM 04/08/2022    8:05 AM 09/24/2021    8:17 AM 05/29/2021    9:51 AM  Fall Risk   Falls in the past year? 0 0 0 0  Number falls in past yr: 0 0 0 0  Injury with Fall? 0 0 0 0  Risk for fall due to : History of fall(s) No Fall Risks No Fall Risks   Follow up Falls evaluation completed Falls evaluation completed Falls evaluation completed Falls prevention discussed    [place]:  Any stairs in or around the home? Yes  If so, are there any without handrails? No  Home free of loose throw rugs in walkways, pet beds, electrical cords, etc? Yes  Adequate lighting in your home to reduce risk of falls? Yes   ASSISTIVE DEVICES UTILIZED TO PREVENT FALLS:  Life alert? No  Use of a cane, walker or w/c? No  Grab bars in the bathroom? No  Shower chair or bench  in shower? No  Elevated toilet seat or a handicapped toilet? Yes   TIMED UP AND GO:  Was the test performed? No . Audio visit   Cognitive Function:        06/05/2022   10:10 AM  6CIT Screen  What Year? 0 points  What month? 0 points  What time? 0 points  Count back from 20 0 points  Months  in reverse 0 points  Repeat phrase 0 points  Total Score 0 points    Immunizations Immunization History  Administered Date(s) Administered   Influenza, High Dose Seasonal PF 07/06/2018, 06/21/2019, 05/08/2020   Influenza,inj,Quad PF,6+ Mos 06/30/2017   Influenza-Unspecified 06/24/2016, 05/08/2021   Janssen (J&J) SARS-COV-2 Vaccination 05/02/2020, 07/03/2020   PNEUMOCOCCAL CONJUGATE-20 11/23/2020   Pneumococcal Conjugate-13 06/30/2017   Zoster Recombinat (Shingrix) 08/16/2018, 11/23/2020   Zoster, Live 09/08/2011    Flu Vaccine status: Due, Education has been provided regarding the importance of this vaccine. Advised may receive this vaccine at local pharmacy or Health Dept. Aware to provide a copy of the vaccination record if obtained from local pharmacy or Health Dept. Verbalized acceptance and understanding.  Pneumococcal vaccine status: Up to date  Covid-19 vaccine status: Declined, Education has been provided regarding the importance of this vaccine but patient still declined. Advised may receive this vaccine at local pharmacy or Health Dept.or vaccine clinic. Aware to provide a copy of the vaccination record if obtained from local pharmacy or Health Dept. Verbalized acceptance and understanding.  Qualifies for Shingles Vaccine? Yes   Zostavax completed Yes   Shingrix Completed?: Yes  Screening Tests Health Maintenance  Topic Date Due   INFLUENZA VACCINE  04/07/2022   MAMMOGRAM  04/08/2024   COLONOSCOPY (Pts 45-66yr Insurance coverage will need to be confirmed)  12/02/2027   Pneumonia Vaccine 70 Years old  Completed   DEXA SCAN  Completed   Hepatitis C Screening  Completed   Zoster Vaccines- Shingrix  Completed   HPV VACCINES  Aged Out   TETANUS/TDAP  Discontinued   COVID-19 Vaccine  Discontinued    Health Maintenance  Health Maintenance Due  Topic Date Due   INFLUENZA VACCINE  04/07/2022    Colorectal cancer screening: Type of screening: Colonoscopy. Completed  12/01/17. Repeat every 10 years  Mammogram status: Completed 04/08/22. Repeat every year  Bone Density status: Completed 05/01/21. Results reflect: Bone density results: OSTEOPENIA. Repeat every 2 years.  Lung Cancer Screening: (Low Dose CT Chest recommended if Age 70-80years, 30 pack-year currently smoking OR have quit w/in 15years.) does not qualify.   Lung Cancer Screening Referral: N/a  Additional Screening:  Hepatitis C Screening: does qualify; Completed 03/25/21  Vision Screening: Recommended annual ophthalmology exams for early detection of glaucoma and other disorders of the eye. Is the patient up to date with their annual eye exam?  Yes  Who is the provider or what is the name of the office in which the patient attends annual eye exams? Dr. KMarjo BickerIf pt is not established with a provider, would they like to be referred to a provider to establish care? No .   Dental Screening: Recommended annual dental exams for proper oral hygiene  Community Resource Referral / Chronic Care Management: CRR required this visit?  No   CCM required this visit?  No      Plan:     I have personally reviewed and noted the following in the patient's chart:   Medical and social history Use of alcohol, tobacco or illicit  drugs  Current medications and supplements including opioid prescriptions. Patient is not currently taking opioid prescriptions. Functional ability and status Nutritional status Physical activity Advanced directives List of other physicians Hospitalizations, surgeries, and ER visits in previous 12 months Vitals Screenings to include cognitive, depression, and falls Referrals and appointments  In addition, I have reviewed and discussed with patient certain preventive protocols, quality metrics, and best practice recommendations. A written personalized care plan for preventive services as well as general preventive health recommendations were provided to patient.   Due  to this being a telephonic visit, the after visit summary with patients personalized plan was offered to patient via mail or my-chart.  Patient would like to access on my-chart.  Beatris Ship, [place]   06/05/2022   Nurse Notes: None

## 2022-06-05 NOTE — Patient Instructions (Signed)
Lisa Morrison , Thank you for taking time to come for your Medicare Wellness Visit. I appreciate your ongoing commitment to your health goals. Please review the following plan we discussed and let me know if I can assist you in the future.   These are the goals we discussed:  Goals      Patient Stated     Increase activity as tolerated        This is a list of the screening recommended for you and due dates:  Health Maintenance  Topic Date Due   Flu Shot  04/07/2022   Mammogram  04/08/2024   Colon Cancer Screening  12/02/2027   Pneumonia Vaccine  Completed   DEXA scan (bone density measurement)  Completed   Hepatitis C Screening: USPSTF Recommendation to screen - Ages 57-79 yo.  Completed   Zoster (Shingles) Vaccine  Completed   HPV Vaccine  Aged Out   Tetanus Vaccine  Discontinued   COVID-19 Vaccine  Discontinued     Next appointment: Follow up in one year for your annual wellness visit    Preventive Care 70 Years and Older, Female Preventive care refers to lifestyle choices and visits with your health care provider that can promote health and wellness. What does preventive care include? A yearly physical exam. This is also called an annual well check. Dental exams once or twice a year. Routine eye exams. Ask your health care provider how often you should have your eyes checked. Personal lifestyle choices, including: Daily care of your teeth and gums. Regular physical activity. Eating a healthy diet. Avoiding tobacco and drug use. Limiting alcohol use. Practicing safe sex. Taking low-dose aspirin every day. Taking vitamin and mineral supplements as recommended by your health care provider. What happens during an annual well check? The services and screenings done by your health care provider during your annual well check will depend on your age, overall health, lifestyle risk factors, and family history of disease. Counseling  Your health care provider may ask you  questions about your: Alcohol use. Tobacco use. Drug use. Emotional well-being. Home and relationship well-being. Sexual activity. Eating habits. History of falls. Memory and ability to understand (cognition). Work and work Statistician. Reproductive health. Screening  You may have the following tests or measurements: Height, weight, and BMI. Blood pressure. Lipid and cholesterol levels. These may be checked every 5 years, or more frequently if you are over 31 years old. Skin check. Lung cancer screening. You may have this screening every year starting at age 2 if you have a 30-pack-year history of smoking and currently smoke or have quit within the past 15 years. Fecal occult blood test (FOBT) of the stool. You may have this test every year starting at age 79. Flexible sigmoidoscopy or colonoscopy. You may have a sigmoidoscopy every 5 years or a colonoscopy every 10 years starting at age 19. Hepatitis C blood test. Hepatitis B blood test. Sexually transmitted disease (STD) testing. Diabetes screening. This is done by checking your blood sugar (glucose) after you have not eaten for a while (fasting). You may have this done every 1-3 years. Bone density scan. This is done to screen for osteoporosis. You may have this done starting at age 40. Mammogram. This may be done every 1-2 years. Talk to your health care provider about how often you should have regular mammograms. Talk with your health care provider about your test results, treatment options, and if necessary, the need for more tests. Vaccines  Your health  care provider may recommend certain vaccines, such as: Influenza vaccine. This is recommended every year. Tetanus, diphtheria, and acellular pertussis (Tdap, Td) vaccine. You may need a Td booster every 10 years. Zoster vaccine. You may need this after age 54. Pneumococcal 13-valent conjugate (PCV13) vaccine. One dose is recommended after age 27. Pneumococcal polysaccharide  (PPSV23) vaccine. One dose is recommended after age 64. Talk to your health care provider about which screenings and vaccines you need and how often you need them. This information is not intended to replace advice given to you by your health care provider. Make sure you discuss any questions you have with your health care provider. Document Released: 09/20/2015 Document Revised: 05/13/2016 Document Reviewed: 06/25/2015 Elsevier Interactive Patient Education  2017 [place] Prevention in the Home Falls can cause injuries. They can happen to people of all ages. There are many things you can do to make your home safe and to help prevent falls. What can I do on the outside of my home? Regularly fix the edges of walkways and driveways and fix any cracks. Remove anything that might make you trip as you walk through a door, such as a raised step or threshold. Trim any bushes or trees on the path to your home. Use bright outdoor lighting. Clear any walking paths of anything that might make someone trip, such as rocks or tools. Regularly check to see if handrails are loose or broken. Make sure that both sides of any steps have handrails. Any raised decks and porches should have guardrails on the edges. Have any leaves, snow, or ice cleared regularly. Use sand or salt on walking paths during winter. Clean up any spills in your garage right away. This includes oil or grease spills. What can I do in the bathroom? Use night lights. Install grab bars by the toilet and in the tub and shower. Do not use towel bars as grab bars. Use non-skid mats or decals in the tub or shower. If you need to sit down in the shower, use a plastic, non-slip stool. Keep the floor dry. Clean up any water that spills on the floor as soon as it happens. Remove soap buildup in the tub or shower regularly. Attach bath mats securely with double-sided non-slip rug tape. Do not have throw rugs and other things on the  floor that can make you trip. What can I do in the bedroom? Use night lights. Make sure that you have a light by your bed that is easy to reach. Do not use any sheets or blankets that are too big for your bed. They should not hang down onto the floor. Have a firm chair that has side arms. You can use this for support while you get dressed. Do not have throw rugs and other things on the floor that can make you trip. What can I do in the kitchen? Clean up any spills right away. Avoid walking on wet floors. Keep items that you use a lot in easy-to-reach places. If you need to reach something above you, use a strong step stool that has a grab bar. Keep electrical cords out of the way. Do not use floor polish or wax that makes floors slippery. If you must use wax, use non-skid floor wax. Do not have throw rugs and other things on the floor that can make you trip. What can I do with my stairs? Do not leave any items on the stairs. Make sure that there are handrails on  both sides of the stairs and use them. Fix handrails that are broken or loose. Make sure that handrails are as long as the stairways. Check any carpeting to make sure that it is firmly attached to the stairs. Fix any carpet that is loose or worn. Avoid having throw rugs at the top or bottom of the stairs. If you do have throw rugs, attach them to the floor with carpet tape. Make sure that you have a light switch at the top of the stairs and the bottom of the stairs. If you do not have them, ask someone to add them for you. What else can I do to help prevent falls? Wear shoes that: Do not have high heels. Have rubber bottoms. Are comfortable and fit you well. Are closed at the toe. Do not wear sandals. If you use a stepladder: Make sure that it is fully opened. Do not climb a closed stepladder. Make sure that both sides of the stepladder are locked into place. Ask someone to hold it for you, if possible. Clearly mark and make  sure that you can see: Any grab bars or handrails. First and last steps. Where the edge of each step is. Use tools that help you move around (mobility aids) if they are needed. These include: Canes. Walkers. Scooters. Crutches. Turn on the lights when you go into a dark area. Replace any light bulbs as soon as they burn out. Set up your furniture so you have a clear path. Avoid moving your furniture around. If any of your floors are uneven, fix them. If there are any pets around you, be aware of where they are. Review your medicines with your doctor. Some medicines can make you feel dizzy. This can increase your chance of falling. Ask your doctor what other things that you can do to help prevent falls. This information is not intended to replace advice given to you by your health care provider. Make sure you discuss any questions you have with your health care provider. Document Released: 06/20/2009 Document Revised: 01/30/2016 Document Reviewed: 09/28/2014 Elsevier Interactive Patient Education  2017 [employer].

## 2022-06-11 DIAGNOSIS — J301 Allergic rhinitis due to pollen: Secondary | ICD-10-CM | POA: Diagnosis not present

## 2022-06-11 DIAGNOSIS — J4540 Moderate persistent asthma, uncomplicated: Secondary | ICD-10-CM | POA: Diagnosis not present

## 2022-06-11 DIAGNOSIS — J3089 Other allergic rhinitis: Secondary | ICD-10-CM | POA: Diagnosis not present

## 2022-06-11 DIAGNOSIS — J3081 Allergic rhinitis due to animal (cat) (dog) hair and dander: Secondary | ICD-10-CM | POA: Diagnosis not present

## 2022-06-11 DIAGNOSIS — R0609 Other forms of dyspnea: Secondary | ICD-10-CM | POA: Diagnosis not present

## 2022-06-12 NOTE — Progress Notes (Signed)
Location of Breast Cancer:  Malignant neoplasm of upper-inner quadrant of left breast in female, estrogen receptor positive   Histology per Pathology Report:  05/22/2022 A.  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 BREAST, LEFT ADDITIONAL ANTERIOR MARGIN, EXCISION: - Benign breast tissue - Final anterior margin negative for carcinoma C. BREAST, LEFT ADDITIONAL SUPERIOR MARGIN, EXCISION: - Benign breast tissue - Final superior margin negative for carcinoma D. LYMPH NODE, LEFT AXILLARY, SENTINEL, EXCISION: - One lymph node negative for metastatic carcinoma (0/1) E. LYMPH NODE, LEFT AXILLARY, SENTINEL, EXCISION: - One lymph node negative for metastatic carcinoma (0/1) F. LYMPH NODE, LEFT AXILLARY, SENTINEL, EXCISION: - One lymph node negative for metastatic carcinoma (0/1) G. LYMPH NODE, LEFT AXILLARY, SENTINEL, EXCISION: - One lymph node negative for metastatic carcinoma (0/1) H. BREAST, LEFT AXILLARY, TISSUE: - Benign adipose tissue - Negative for lymph node or malignancy  Receptor Status: ER(100%), PR (100%), Her2-neu (Negative via IHC), Ki-67(>1%)  Past/Anticipated interventions by surgeon, if any:  06/09/2022 --Dr. Autumn Messing (office visit) The patient is about 2 weeks status post left breast lumpectomy for breast cancer. She tolerated the surgery well. At this point she will meet with medical and radiation oncology to discuss adjuvant therapy. I will plan to see her back in about 6 months.  05/22/2022 --Dr.  Autumn Messing LEFT BREAST LUMPECTOMY WITH RADIOACTIVE SEED LOCALIZATION  DEEP LEFT AXILLARY SENTINEL LYMPH NODE BIOPSY   Past/Anticipated interventions by medical oncology, if any:  Under care of Dr. Arletha Pili Iruku 06/01/2022 Given strong ER/PR positivity, low-grade and low proliferation index, I do not see any indication for Oncotype testing.   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  We have previously and today discussed about likely no role for Oncotype or adjuvant chemotherapy.   She is now going to pursue adjuvant radiation but has an appointment with radiation oncology team to discuss options.   Following adjuvant radiation, she will start antiestrogen therapy.   She is not quite sure about the choice of antiestrogen therapy hence I have discussed once again about tamoxifen versus aromatase inhibitors, mechanism of action for each class as well as adverse effects with each class. At the end of the conversation, she was comfortable with either of antiestrogen therapy.   She will return to clinic after radiation.    Lymphedema issues, if any:  None    Pain issues, if any:  Denies   SAFETY ISSUES: Prior radiation? No Pacemaker/ICD? No; does have a spinal cord stimulator  Possible current pregnancy? No--hysterectomy Is the patient on methotrexate? No  Current Complaints / other details:  Nothing else of note

## 2022-06-15 DIAGNOSIS — C50212 Malignant neoplasm of upper-inner quadrant of left female breast: Secondary | ICD-10-CM | POA: Diagnosis not present

## 2022-06-15 DIAGNOSIS — Z170 Estrogen receptor positive status [ER+]: Secondary | ICD-10-CM | POA: Diagnosis not present

## 2022-06-15 NOTE — Progress Notes (Signed)
Radiation Oncology         [phone] ________________________________  Name: Lisa Morrison MRN: [id number]  Date: 06/16/2022  DOB: 08-23-1951  Follow-Up Visit Note  Outpatient  CC: Shelda Pal, DO  Benay Pike, MD  Diagnosis:   No diagnosis found.   S/p lumpectomy: Left Breast UIQ, Invasive ductal carcinoma with low-grade DCIS, ER+ / PR+ / Her2-, Grade 1; negative margins and nodes    Cancer Staging  Malignant neoplasm of upper-inner quadrant of left breast in female, estrogen receptor positive ([place]) Staging form: Breast, AJCC 8th Edition - Clinical stage from 04/29/2022: Stage IA (cT1b, cN0, cM0, G1, ER+, PR+, HER2-) - Unsigned  CHIEF COMPLAINT: Here to discuss management of left  breast cancer  Narrative:  The patient returns today for follow-up.     Since breast clinic consultation date of 04/29/22, the patient opted to proceed with left breast lumpectomy and SLN biopsies on 05/22/22 under the care of Dr. Marlou Starks. Pathology from the procedure revealed: tumor size of 0.7 cm; histology of grade 1 invasive ductal carcinoma with low-grade DCIS and calcifications; all margins negative for for both invasive and in-situ disease; margin status to invasive disease of 1 cm from the medial margin; margin status to in situ disease of 1 cm from the medial margin; nodal status of 4/4 left axillary sentinel lymph node excisions negative for carcinoma;  ER status: 100% positive and PR status 100% positive, both with strong staining intensity; proliferation marker Ki67 at less than 1%; Her2 status negative by IHC; Grade 1.  The patient will return to Dr. Chryl Heck following XRT to initiate tamoxifen or AI.   Symptomatically, the patient reports: ***        ALLERGIES:  is allergic to augmentin [amoxicillin-pot clavulanate], demerol [meperidine], amoxicillin, and clavulanic acid.  Meds: Current Outpatient Medications  Medication Sig Dispense Refill   acetaminophen (TYLENOL) 650 MG CR  tablet Take 1,300 mg by mouth 2 (two) times daily as needed.     ADVAIR HFA 230-21 MCG/ACT inhaler Inhale 2 puffs into the lungs 2 (two) times daily.     buPROPion (WELLBUTRIN SR) 150 MG 12 hr tablet TAKE 1 TABLET BY MOUTH TWICE A DAY 180 tablet 1   Calcium Carbonate-Vit D-Min (CALTRATE 600+D PLUS MINERALS) 600-800 MG-UNIT TABS Take by mouth.     EPIPEN 2-PAK 0.3 MG/0.3ML SOAJ injection      levocetirizine (XYZAL) 5 MG tablet      meloxicam (MOBIC) 7.5 MG tablet      montelukast (SINGULAIR) 10 MG tablet Take by mouth.     nortriptyline (PAMELOR) 10 MG capsule TAKE 1-2 CAPSULES (10-20 MG TOTAL) BY MOUTH AT BEDTIME. 180 capsule 2   potassium chloride (MICRO-K) 10 MEQ CR capsule TAKE 2 CAPSULES BY MOUTH EVERY DAY 180 capsule 1   pregabalin (LYRICA) 150 MG capsule TAKE 1 CAPSULE BY MOUTH EVERY NIGHT 90 capsule 1   Prucalopride Succinate (MOTEGRITY) 2 MG TABS      topiramate (TOPAMAX) 50 MG tablet TAKE 1 TABLET BY MOUTH TWICE DAILY. 180 tablet 1   triamterene-hydrochlorothiazide (DYAZIDE) 37.5-25 MG capsule TAKE 1 EACH (1 CAPSULE TOTAL) BY MOUTH DAILY. 90 capsule 1   No current facility-administered medications for this encounter.    Physical Findings:  vitals were not taken for this visit. .     General: Alert and oriented, in no acute distress HEENT: Head is normocephalic. Extraocular movements are intact. Oropharynx is clear. Neck: Neck is supple, no palpable cervical or supraclavicular lymphadenopathy.  Heart: Regular in rate and rhythm with no murmurs, rubs, or gallops. Chest: Clear to auscultation bilaterally, with no rhonchi, wheezes, or rales. Abdomen: Soft, nontender, nondistended, with no rigidity or guarding. Extremities: No cyanosis or edema. Lymphatics: see Neck Exam Musculoskeletal: symmetric strength and muscle tone throughout. Neurologic: No obvious focalities. Speech is fluent.  Psychiatric: Judgment and insight are intact. Affect is appropriate. Breast exam reveals  ***  Lab Findings: Lab Results  Component Value Date   WBC 6.0 04/29/2022   HGB 15.5 (H) 04/29/2022   HCT 44.7 04/29/2022   MCV 89.9 04/29/2022   PLT 269 04/29/2022    @LASTCHEMISTRY @  Radiographic Findings: MM Breast Surgical Specimen  Result Date: 05/22/2022 CLINICAL DATA:  Specimen radiograph of the left breast. EXAM: SPECIMEN RADIOGRAPH OF THE LEFT BREAST COMPARISON:  Previous exam(s). FINDINGS: Status post excision of the left breast. The radioactive seed and biopsy marker clip are present, completely intact, and were marked for pathology. IMPRESSION: Specimen radiograph of the left breast. Electronically Signed   By: Lillia Mountain M.D.   On: 05/22/2022 11:54  MM LT RADIOACTIVE SEED LOC MAMMO GUIDE  Result Date: 05/21/2022 CLINICAL DATA:  71 year old with biopsy-proven invasive ductal carcinoma and DCIS involving the UPPER INNER QUADRANT of the LEFT breast at middle depth. Radioactive seed localization is performed in anticipation of lumpectomy which is scheduled for tomorrow. EXAM: MAMMOGRAPHIC GUIDED RADIOACTIVE SEED LOCALIZATION OF THE LEFT BREAST COMPARISON:  Previous exam(s). FINDINGS: Patient presents for radioactive seed localization prior to LEFT breast lumpectomy. I met with the patient and we discussed the procedure of seed localization including benefits and alternatives. We discussed the high likelihood of a successful procedure. We discussed the risks of the procedure including infection, bleeding, tissue injury and further surgery. We discussed the low dose of radioactivity involved in the procedure. Informed, written consent was given. The usual time-out protocol was performed immediately prior to the procedure. Using mammographic guidance, sterile technique with chlorhexidine as skin antisepsis, 1% lidocaine as local anesthetic, an I-125 radioactive seed was used to localize the ribbon shaped tissue marking clip associated with the biopsy-proven malignancy in the [place] using a medial approach. The follow-up mammogram images confirm that the seed is appropriately positioned approximately 3 mm posterior to the clip. The images are marked for Dr. Marlou Starks. Follow-up survey of the patient confirms the presence of the radioactive seed. Order number of I-125 seed: [id number] Total activity: 0.244 mCi    Reference Date: 04/08/2022 The patient tolerated the procedure well and was released from the [facility]. She was given instructions regarding seed removal. IMPRESSION: Radioactive seed localization of biopsy-proven invasive ductal carcinoma and DCIS involving the UPPER INNER QUADRANT of the LEFT breast. No apparent complications. Electronically Signed   By: Evangeline Dakin M.D.   On: 05/21/2022 15:20   Impression/Plan: We discussed adjuvant radiotherapy today.  I recommend *** in order to ***.  I reviewed the logistics, benefits, risks, and potential side effects of this treatment in detail. Risks may include but not necessary be limited to acute and late injury tissue in the radiation fields such as skin irritation (change in color/pigmentation, itching, dryness, pain, peeling). She may experience fatigue. We also discussed possible risk of long term cosmetic changes or scar tissue. There is also a smaller risk for lung toxicity, ***cardiac toxicity, ***brachial plexopathy, ***lymphedema, ***musculoskeletal changes, ***rib fragility or ***induction of a second malignancy, ***late chronic non-healing soft tissue wound.    The patient asked good questions which I  answered to her satisfaction. She is enthusiastic about proceeding with treatment. A consent form has been *** signed and placed in her chart.  A total of *** medically necessary complex treatment devices will be fabricated and supervised by me: *** fields with MLCs for custom blocks to protect heart, and lungs;  and, a Vac-lok. MORE COMPLEX DEVICES MAY BE MADE IN DOSIMETRY FOR FIELD IN FIELD BEAMS FOR DOSE  HOMOGENEITY.  I have requested : 3D Simulation which is medically necessary to give adequate dose to at risk tissues while sparing lungs and heart.  I have requested a DVH of the following structures: lungs, heart, *** lumpectomy cavity.    The patient will receive *** Gy in *** fractions to the *** with *** fields.  This will be *** followed by a boost.  On date of service, in total, I spent *** minutes on this encounter. Patient was seen in person.  _____________________________________   Eppie Gibson, MD  This document serves as a record of services personally performed by Eppie Gibson, MD. It was created on her behalf by Roney Mans, a trained medical scribe. The creation of this record is based on the scribe's personal observations and the provider's statements to them. This document has been checked and approved by the attending provider.

## 2022-06-15 NOTE — Therapy (Signed)
OUTPATIENT PHYSICAL THERAPY BREAST CANCER POST OP FOLLOW UP   Patient Name: Lisa Morrison MRN: [id number] DOB:[birth year], 71 y.o., female Today's Date: 06/15/2022    Past Medical History:  Diagnosis Date   Anxiety    Arthritis    Breast cancer ([place])    CFS (chronic fatigue syndrome)    Depression    denies now   Dyspnea    Edema    due to taking lyrica   Family history of adverse reaction to anesthesia    son had n/v   Family history of pancreatic cancer 05/03/2022   Fibromyalgia    IBS (irritable bowel syndrome)    Leaky heart valve    Migraine    Orthostatic hypotension    from a  previous surgery    PONV (postoperative nausea and vomiting)    and heart rate goes down " I fainted after surgery when standing in the restroom."   Seasonal allergies    Spondylolisthesis of lumbar region    Wears glasses    Past Surgical History:  Procedure Laterality Date   ABDOMINAL HYSTERECTOMY     back ablasion     03/2018 and 05/2018   back stimulator  2022   BACK SURGERY  2015   BREAST LUMPECTOMY WITH RADIOACTIVE SEED AND SENTINEL LYMPH NODE BIOPSY Left 05/22/2022   Procedure: LEFT BREAST LUMPECTOMY WITH RADIOACTIVE SEED AND SENTINEL LYMPH NODE BIOPSY;  Surgeon: Jovita Kussmaul, MD;  Location: [facility];  Service: General;  Laterality: Left;   CHOLECYSTECTOMY     HERNIA REPAIR     x2   OOPHORECTOMY     VARICOSE VEIN SURGERY     Patient Active Problem List   Diagnosis Date Noted   Family history of pancreatic cancer 05/03/2022   Malignant neoplasm of upper-inner quadrant of left breast in female, estrogen receptor positive ([place]) 04/27/2022   Essential hypertension 06/10/2020   GAD (generalized anxiety disorder) 06/10/2020   Allergies 04/09/2020   Elevated blood-pressure reading, without diagnosis of hypertension 02/15/2020   Body mass index (BMI) 29.0-29.9, adult 11/07/2019   S/P lumbar fusion 03/20/2019   Lumbar facet arthropathy 10/27/2018   Asbestos  exposure 09/30/2016   DDD (degenerative disc disease), lumbar 09/30/2016   Hyperglycemia 09/30/2016   Mixed hyperlipidemia 09/30/2016   Fibromyalgia 04/27/2016   Anxiety 04/01/2016   Arthritis of left knee 04/01/2016   Chronic fatigue 04/01/2016   Irritable bowel syndrome with constipation 04/01/2016   Lumbar stenosis 04/01/2016   Recurrent major depressive disorder ([place]) 04/01/2016   Chest pain syndrome 03/31/2016   Palpitations 03/31/2016   Shortness of breath 03/31/2016    PCP: ***  REFERRING PROVIDER: Dr. Autumn Messing  REFERRING DIAG: Left Breast Cancer  THERAPY DIAG:  No diagnosis found.  Rationale for Evaluation and Treatment Rehabilitation  ONSET DATE: 04/08/2022  SUBJECTIVE:  SUBJECTIVE STATEMENT: ***  PERTINENT HISTORY:  Patient was diagnosed on 04/08/2022 with left grade 1 invasive ductal carcinoma breast cancer. It measures 6 mm of distortion and is located in the upper inner quadrant. It is ER/PR positive and HER2 negative with a Ki67 of < 1%. She had a left lumpectomy with SLNB on 05/22/2022 and 0/4 LN's. She has had multiple lumbar spine surgeries including a fusion and most recently a spinal stimulator implanted in 07/2021  PATIENT GOALS:  Reassess how my recovery is going related to arm function, pain, and swelling.  PAIN:  Are you having pain? {OPRCPAIN:27236}  PRECAUTIONS: Recent Surgery, left UE Lymphedema risk, Other: Spinal Stimulator (NO SOZO)FMS, GAD , multiple back surgeries  ACTIVITY LEVEL / LEISURE: does not exercises   OBJECTIVE:   PATIENT SURVEYS:  QUICK DASH: ***  OBSERVATIONS:  ***  POSTURE:  Forward head, rounded shoulders  LYMPHEDEMA ASSESSMENT:   UPPER EXTREMITY AROM/PROM:   A/PROM RIGHT   eval    Shoulder extension 51  Shoulder flexion 143   Shoulder abduction 140  Shoulder internal rotation 56  Shoulder external rotation 79                          (Blank rows = not tested)   A/PROM LEFT   eval   Shoulder extension 48   Shoulder flexion 125   Shoulder abduction 137   Shoulder internal rotation 54   Shoulder external rotation 73                           (Blank rows = not tested)     CERVICAL AROM: All within normal limits   UPPER EXTREMITY STRENGTH: WNL     LYMPHEDEMA ASSESSMENTS:    LANDMARK RIGHT   eval  10 cm proximal to olecranon process 29.8  Olecranon process 25.1  10 cm proximal to ulnar styloid process 21.4  Just proximal to ulnar styloid process 14.4  Across hand at thumb web space 17.7  At base of 2nd digit 6.3  (Blank rows = not tested)   LANDMARK LEFT   eval   10 cm proximal to olecranon process 29   Olecranon process 24.4   10 cm proximal to ulnar styloid process 19.9   Just proximal to ulnar styloid process 13.9   Across hand at thumb web space 17.3   At base of 2nd digit 5.9   (Blank rows = not tested)           Surgery type/Date: 05/22/2022, Left lumpectomy with SLNB Number of lymph nodes removed: 0/4 Current/past treatment (chemo, radiation, hormone therapy): pending radiation, anti estrogens, No chemotherapy Other symptoms:  Heaviness/tightness {yes/no:20286} Pain {yes/no:20286} Pitting edema {yes/no:20286} Infections {yes/no:20286} Decreased scar mobility {yes/no:20286} Stemmer sign {yes/no:20286}   PATIENT EDUCATION:  Education details: *** Person educated: {Person educated:25204} Education method: {Education Method:25205} Education comprehension: {Education Comprehension:25206}   HOME EXERCISE PROGRAM:  Reviewed previously given post op HEP. ***  ASSESSMENT:  CLINICAL IMPRESSION: Pt is s/p left lumpectomy with SLNB and 0/4 LN's.  She is pending radiation and anti estrogens.   Pt will benefit from skilled therapeutic intervention to improve on the  following deficits: Decreased knowledge of precautions, impaired UE functional use, pain, decreased ROM, postural dysfunction.   PT treatment/interventions: ADL/Self care home management, {rehab planned interventions:25118::"Therapeutic exercises","Therapeutic activity","Neuromuscular re-education","Balance training","Gait training","Patient/Family education","Self Care","Joint mobilization"}     GOALS: Goals reviewed with patient? Yes  LONG TERM GOALS:  (STG=LTG)  GOALS Name Target Date  Goal status  1 Pt will demonstrate she has regained full shoulder ROM and function post operatively compared to baselines.  Baseline: {follow up:25551} {GOALSTATUS:25110}  2  {follow up:25551} {GOALSTATUS:25110}  3  {follow up:25551} {GOALSTATUS:25110}  4  {follow up:25551} {GOALSTATUS:25110}     PLAN: PT FREQUENCY/DURATION: ***  PLAN FOR NEXT SESSION: ***   [facility]  [address]  [place] [address]  [phone]  After Breast Cancer Class It is recommended you attend the ABC class to be educated on lymphedema risk reduction. This class is free of charge and lasts for 1 hour. It is a 1-time class. You will need to download the Webex app either on your phone or computer. We will send you a link the night before or the morning of the class. You should be able to click on that link to join the class. This is not a confidential class. You don't have to turn your camera on, but other participants may be able to see your email address.  Scar massage You can begin gentle scar massage to you incision sites. Gently place one hand on the incision and move the skin (without sliding on the skin) in various directions. Do this for a few minutes and then you can gently massage either coconut oil or vitamin E cream into the scars.  Compression garment You should continue wearing your compression bra until you feel like you no longer have swelling.  Home exercise  Program Continue doing the exercises you were given until you feel like you can do them without feeling any tightness at the end.   Walking Program Studies show that 30 minutes of walking per day (fast enough to elevate your heart rate) can significantly reduce the risk of a cancer recurrence. If you can't walk due to other medical reasons, we encourage you to find another activity you could do (like a stationary bike or water exercise).  Posture After breast cancer surgery, people frequently sit with rounded shoulders posture because it puts their incisions on slack and feels better. If you sit like this and scar tissue forms in that position, you can become very tight and have pain sitting or standing with good posture. Try to be aware of your posture and sit and stand up tall to heal properly.  Follow up PT: It is recommended you return every 3 months for the first 3 years following surgery to be assessed on the SOZO machine for an L-Dex score. This helps prevent clinically significant lymphedema in 95% of patients. These follow up screens are 10 minute appointments that you are not billed for.  Claris Pong, PT 06/15/2022, 5:30 PM

## 2022-06-16 ENCOUNTER — Ambulatory Visit: Payer: Medicare PPO | Attending: General Surgery

## 2022-06-16 ENCOUNTER — Encounter (HOSPITAL_COMMUNITY): Payer: Self-pay

## 2022-06-16 ENCOUNTER — Ambulatory Visit
Admission: RE | Admit: 2022-06-16 | Discharge: 2022-06-16 | Disposition: A | Discharge status: Home / Self Care | Payer: Medicare PPO | Source: Ambulatory Visit | Attending: Radiation Oncology | Admitting: Radiation Oncology

## 2022-06-16 ENCOUNTER — Other Ambulatory Visit: Payer: Self-pay

## 2022-06-16 ENCOUNTER — Encounter: Payer: Self-pay | Admitting: Radiation Oncology

## 2022-06-16 VITALS — BP 136/71 | HR 70 | Temp 97.7°F | Resp 18 | Ht 64.0 in | Wt 185.1 lb

## 2022-06-16 DIAGNOSIS — Z170 Estrogen receptor positive status [ER+]: Secondary | ICD-10-CM | POA: Insufficient documentation

## 2022-06-16 DIAGNOSIS — Z7951 Long term (current) use of inhaled steroids: Secondary | ICD-10-CM | POA: Insufficient documentation

## 2022-06-16 DIAGNOSIS — Z79899 Other long term (current) drug therapy: Secondary | ICD-10-CM | POA: Diagnosis not present

## 2022-06-16 DIAGNOSIS — R293 Abnormal posture: Secondary | ICD-10-CM | POA: Insufficient documentation

## 2022-06-16 DIAGNOSIS — C50212 Malignant neoplasm of upper-inner quadrant of left female breast: Secondary | ICD-10-CM | POA: Insufficient documentation

## 2022-06-16 DIAGNOSIS — Z510 Encounter for antineoplastic radiation therapy: Secondary | ICD-10-CM | POA: Diagnosis not present

## 2022-06-16 NOTE — Patient Instructions (Signed)
     [facility]  [address]   [place] [address]  [phone]  After Breast Cancer Class It is recommended you attend the ABC class to be educated on lymphedema risk reduction. This class is free of charge and lasts for 1 hour. It is a 1-time class. You will need to download the Webex app either on your phone or computer. We will send you a link the night before or the morning of the class. You should be able to click on that link to join the class. This is not a confidential class. You don't have to turn your camera on, but other participants may be able to see your email address.  Scar massage You can begin gentle scar massage to you incision sites. Gently place one hand on the incision and move the skin (without sliding on the skin) in various directions. Do this for a few minutes and then you can gently massage either coconut oil or vitamin E cream into the scars.  Compression garment You should continue wearing your compression bra until you feel like you no longer have swelling.  Home exercise Program Continue doing the exercises you were given until you feel like you can do them without feeling any tightness at the end.   Walking Program Studies show that 30 minutes of walking per day (fast enough to elevate your heart rate) can significantly reduce the risk of a cancer recurrence. If you can't walk due to other medical reasons, we encourage you to find another activity you could do (like a stationary bike or water exercise).  Posture After breast cancer surgery, people frequently sit with rounded shoulders posture because it puts their incisions on slack and feels better. If you sit like this and scar tissue forms in that position, you can become very tight and have pain sitting or standing with good posture. Try to be aware of your posture and sit and stand up tall to heal properly.  Follow up PT: It is recommended you return every 3 months for  the first 2 years following surgery to be assessed on the SOZO machine for an L-Dex score. This helps prevent clinically significant lymphedema in 95% of patients. These follow up screens are 10 minute appointments that you are not billed for.  

## 2022-06-17 DIAGNOSIS — J301 Allergic rhinitis due to pollen: Secondary | ICD-10-CM | POA: Diagnosis not present

## 2022-06-17 DIAGNOSIS — J3081 Allergic rhinitis due to animal (cat) (dog) hair and dander: Secondary | ICD-10-CM | POA: Diagnosis not present

## 2022-06-17 DIAGNOSIS — J3089 Other allergic rhinitis: Secondary | ICD-10-CM | POA: Diagnosis not present

## 2022-06-19 DIAGNOSIS — J301 Allergic rhinitis due to pollen: Secondary | ICD-10-CM | POA: Diagnosis not present

## 2022-06-19 DIAGNOSIS — Z170 Estrogen receptor positive status [ER+]: Secondary | ICD-10-CM | POA: Diagnosis not present

## 2022-06-19 DIAGNOSIS — J3089 Other allergic rhinitis: Secondary | ICD-10-CM | POA: Diagnosis not present

## 2022-06-19 DIAGNOSIS — C50212 Malignant neoplasm of upper-inner quadrant of left female breast: Secondary | ICD-10-CM | POA: Diagnosis not present

## 2022-06-19 DIAGNOSIS — J3081 Allergic rhinitis due to animal (cat) (dog) hair and dander: Secondary | ICD-10-CM | POA: Diagnosis not present

## 2022-06-19 DIAGNOSIS — Z510 Encounter for antineoplastic radiation therapy: Secondary | ICD-10-CM | POA: Diagnosis not present

## 2022-06-22 ENCOUNTER — Encounter: Payer: Self-pay | Admitting: *Deleted

## 2022-06-24 ENCOUNTER — Ambulatory Visit
Admission: RE | Admit: 2022-06-24 | Discharge: 2022-06-24 | Disposition: A | Discharge status: Home / Self Care | Payer: Medicare PPO | Source: Ambulatory Visit | Attending: Radiation Oncology | Admitting: Radiation Oncology

## 2022-06-24 ENCOUNTER — Other Ambulatory Visit: Payer: Self-pay

## 2022-06-24 DIAGNOSIS — Z170 Estrogen receptor positive status [ER+]: Secondary | ICD-10-CM | POA: Diagnosis not present

## 2022-06-24 DIAGNOSIS — Z510 Encounter for antineoplastic radiation therapy: Secondary | ICD-10-CM | POA: Diagnosis not present

## 2022-06-24 DIAGNOSIS — C50212 Malignant neoplasm of upper-inner quadrant of left female breast: Secondary | ICD-10-CM | POA: Diagnosis not present

## 2022-06-24 LAB — RAD ONC ARIA SESSION SUMMARY
Course Elapsed Days: 0
Plan Fractions Treated to Date: 1
Plan Prescribed Dose Per Fraction: 2.67 Gy
Plan Total Fractions Prescribed: 15
Plan Total Prescribed Dose: 40.05 Gy
Reference Point Dosage Given to Date: 2.67 Gy
Reference Point Session Dosage Given: 2.67 Gy
Session Number: 1

## 2022-06-25 ENCOUNTER — Other Ambulatory Visit: Payer: Self-pay

## 2022-06-25 ENCOUNTER — Ambulatory Visit
Admission: RE | Admit: 2022-06-25 | Discharge: 2022-06-25 | Disposition: A | Discharge status: Home / Self Care | Payer: Medicare PPO | Source: Ambulatory Visit | Attending: Radiation Oncology | Admitting: Radiation Oncology

## 2022-06-25 DIAGNOSIS — Z170 Estrogen receptor positive status [ER+]: Secondary | ICD-10-CM | POA: Diagnosis not present

## 2022-06-25 DIAGNOSIS — C50212 Malignant neoplasm of upper-inner quadrant of left female breast: Secondary | ICD-10-CM | POA: Diagnosis not present

## 2022-06-25 DIAGNOSIS — Z510 Encounter for antineoplastic radiation therapy: Secondary | ICD-10-CM | POA: Diagnosis not present

## 2022-06-25 LAB — RAD ONC ARIA SESSION SUMMARY
Course Elapsed Days: 1
Plan Fractions Treated to Date: 2
Plan Prescribed Dose Per Fraction: 2.67 Gy
Plan Total Fractions Prescribed: 15
Plan Total Prescribed Dose: 40.05 Gy
Reference Point Dosage Given to Date: 5.34 Gy
Reference Point Session Dosage Given: 2.67 Gy
Session Number: 2

## 2022-06-26 ENCOUNTER — Other Ambulatory Visit: Payer: Self-pay

## 2022-06-26 ENCOUNTER — Ambulatory Visit
Admission: RE | Admit: 2022-06-26 | Discharge: 2022-06-26 | Disposition: A | Discharge status: Home / Self Care | Payer: Medicare PPO | Source: Ambulatory Visit | Attending: Radiation Oncology | Admitting: Radiation Oncology

## 2022-06-26 DIAGNOSIS — Z510 Encounter for antineoplastic radiation therapy: Secondary | ICD-10-CM | POA: Diagnosis not present

## 2022-06-26 DIAGNOSIS — Z170 Estrogen receptor positive status [ER+]: Secondary | ICD-10-CM | POA: Diagnosis not present

## 2022-06-26 DIAGNOSIS — C50212 Malignant neoplasm of upper-inner quadrant of left female breast: Secondary | ICD-10-CM | POA: Diagnosis not present

## 2022-06-26 LAB — RAD ONC ARIA SESSION SUMMARY
Course Elapsed Days: 2
Plan Fractions Treated to Date: 3
Plan Prescribed Dose Per Fraction: 2.67 Gy
Plan Total Fractions Prescribed: 15
Plan Total Prescribed Dose: 40.05 Gy
Reference Point Dosage Given to Date: 8.01 Gy
Reference Point Session Dosage Given: 2.67 Gy
Session Number: 3

## 2022-06-29 ENCOUNTER — Other Ambulatory Visit: Payer: Self-pay

## 2022-06-29 ENCOUNTER — Ambulatory Visit: Payer: Medicare PPO

## 2022-06-29 ENCOUNTER — Ambulatory Visit
Admission: RE | Admit: 2022-06-29 | Discharge: 2022-06-29 | Disposition: A | Discharge status: Home / Self Care | Payer: Medicare PPO | Source: Ambulatory Visit | Attending: Radiation Oncology | Admitting: Radiation Oncology

## 2022-06-29 DIAGNOSIS — Z170 Estrogen receptor positive status [ER+]: Secondary | ICD-10-CM | POA: Diagnosis not present

## 2022-06-29 DIAGNOSIS — Z510 Encounter for antineoplastic radiation therapy: Secondary | ICD-10-CM | POA: Diagnosis not present

## 2022-06-29 DIAGNOSIS — C50212 Malignant neoplasm of upper-inner quadrant of left female breast: Secondary | ICD-10-CM | POA: Diagnosis not present

## 2022-06-29 LAB — RAD ONC ARIA SESSION SUMMARY
Course Elapsed Days: 5
Plan Fractions Treated to Date: 4
Plan Prescribed Dose Per Fraction: 2.67 Gy
Plan Total Fractions Prescribed: 15
Plan Total Prescribed Dose: 40.05 Gy
Reference Point Dosage Given to Date: 10.68 Gy
Reference Point Session Dosage Given: 2.67 Gy
Session Number: 4

## 2022-06-29 MED ORDER — ALRA NON-METALLIC DEODORANT (RAD-ONC)
1.0000 | Freq: Once | TOPICAL | Status: AC
  Administered 2022-06-29: 1 via TOPICAL

## 2022-06-29 MED ORDER — RADIAPLEXRX EX GEL: Freq: Two times a day (BID) | CUTANEOUS | Status: DC

## 2022-06-29 NOTE — Progress Notes (Signed)
Pt here for patient teaching.    Pt given Radiation and You booklet, skin care instructions, Alra deodorant, and Radiaplex gel.    Reviewed areas of pertinence such as fatigue, skin changes, breast tenderness, and breast swelling .   Pt able to give teach back of to pat skin and use unscented/gentle soap,apply Radiaplex bid, avoid applying anything to skin within 4 hours of treatment, avoid wearing an under wire bra, and to use an electric razor if they must shave.   Pt verbalizes understanding of information given and will contact nursing with any questions or concerns.    [email or web address]

## 2022-06-30 ENCOUNTER — Other Ambulatory Visit: Payer: Self-pay

## 2022-06-30 ENCOUNTER — Ambulatory Visit
Admission: RE | Admit: 2022-06-30 | Discharge: 2022-06-30 | Disposition: A | Discharge status: Home / Self Care | Payer: Medicare PPO | Source: Ambulatory Visit | Attending: Radiation Oncology | Admitting: Radiation Oncology

## 2022-06-30 DIAGNOSIS — C50212 Malignant neoplasm of upper-inner quadrant of left female breast: Secondary | ICD-10-CM | POA: Diagnosis not present

## 2022-06-30 DIAGNOSIS — Z170 Estrogen receptor positive status [ER+]: Secondary | ICD-10-CM | POA: Diagnosis not present

## 2022-06-30 DIAGNOSIS — Z510 Encounter for antineoplastic radiation therapy: Secondary | ICD-10-CM | POA: Diagnosis not present

## 2022-06-30 LAB — RAD ONC ARIA SESSION SUMMARY
Course Elapsed Days: 6
Plan Fractions Treated to Date: 5
Plan Prescribed Dose Per Fraction: 2.67 Gy
Plan Total Fractions Prescribed: 15
Plan Total Prescribed Dose: 40.05 Gy
Reference Point Dosage Given to Date: 13.35 Gy
Reference Point Session Dosage Given: 2.67 Gy
Session Number: 5

## 2022-07-01 ENCOUNTER — Other Ambulatory Visit: Payer: Self-pay

## 2022-07-01 ENCOUNTER — Ambulatory Visit
Admission: RE | Admit: 2022-07-01 | Discharge: 2022-07-01 | Disposition: A | Discharge status: Home / Self Care | Payer: Medicare PPO | Source: Ambulatory Visit | Attending: Radiation Oncology | Admitting: Radiation Oncology

## 2022-07-01 DIAGNOSIS — C50212 Malignant neoplasm of upper-inner quadrant of left female breast: Secondary | ICD-10-CM | POA: Diagnosis not present

## 2022-07-01 DIAGNOSIS — J3081 Allergic rhinitis due to animal (cat) (dog) hair and dander: Secondary | ICD-10-CM | POA: Diagnosis not present

## 2022-07-01 DIAGNOSIS — J301 Allergic rhinitis due to pollen: Secondary | ICD-10-CM | POA: Diagnosis not present

## 2022-07-01 DIAGNOSIS — Z170 Estrogen receptor positive status [ER+]: Secondary | ICD-10-CM | POA: Diagnosis not present

## 2022-07-01 DIAGNOSIS — J3089 Other allergic rhinitis: Secondary | ICD-10-CM | POA: Diagnosis not present

## 2022-07-01 DIAGNOSIS — Z510 Encounter for antineoplastic radiation therapy: Secondary | ICD-10-CM | POA: Diagnosis not present

## 2022-07-01 LAB — RAD ONC ARIA SESSION SUMMARY
Course Elapsed Days: 7
Plan Fractions Treated to Date: 6
Plan Prescribed Dose Per Fraction: 2.67 Gy
Plan Total Fractions Prescribed: 15
Plan Total Prescribed Dose: 40.05 Gy
Reference Point Dosage Given to Date: 16.02 Gy
Reference Point Session Dosage Given: 2.67 Gy
Session Number: 6

## 2022-07-02 ENCOUNTER — Other Ambulatory Visit: Payer: Self-pay

## 2022-07-02 ENCOUNTER — Ambulatory Visit
Admission: RE | Admit: 2022-07-02 | Discharge: 2022-07-02 | Disposition: A | Discharge status: Home / Self Care | Payer: Medicare PPO | Source: Ambulatory Visit | Attending: Radiation Oncology | Admitting: Radiation Oncology

## 2022-07-02 DIAGNOSIS — Z510 Encounter for antineoplastic radiation therapy: Secondary | ICD-10-CM | POA: Diagnosis not present

## 2022-07-02 DIAGNOSIS — C50212 Malignant neoplasm of upper-inner quadrant of left female breast: Secondary | ICD-10-CM | POA: Diagnosis not present

## 2022-07-02 DIAGNOSIS — Z170 Estrogen receptor positive status [ER+]: Secondary | ICD-10-CM | POA: Diagnosis not present

## 2022-07-02 LAB — RAD ONC ARIA SESSION SUMMARY
Course Elapsed Days: 8
Plan Fractions Treated to Date: 7
Plan Prescribed Dose Per Fraction: 2.67 Gy
Plan Total Fractions Prescribed: 15
Plan Total Prescribed Dose: 40.05 Gy
Reference Point Dosage Given to Date: 18.69 Gy
Reference Point Session Dosage Given: 2.67 Gy
Session Number: 7

## 2022-07-03 ENCOUNTER — Ambulatory Visit
Admission: RE | Admit: 2022-07-03 | Discharge: 2022-07-03 | Disposition: A | Discharge status: Home / Self Care | Payer: Medicare PPO | Source: Ambulatory Visit | Attending: Radiation Oncology | Admitting: Radiation Oncology

## 2022-07-03 ENCOUNTER — Other Ambulatory Visit: Payer: Self-pay

## 2022-07-03 DIAGNOSIS — Z510 Encounter for antineoplastic radiation therapy: Secondary | ICD-10-CM | POA: Diagnosis not present

## 2022-07-03 DIAGNOSIS — Z170 Estrogen receptor positive status [ER+]: Secondary | ICD-10-CM | POA: Diagnosis not present

## 2022-07-03 DIAGNOSIS — C50212 Malignant neoplasm of upper-inner quadrant of left female breast: Secondary | ICD-10-CM | POA: Diagnosis not present

## 2022-07-03 LAB — RAD ONC ARIA SESSION SUMMARY
Course Elapsed Days: 9
Plan Fractions Treated to Date: 8
Plan Prescribed Dose Per Fraction: 2.67 Gy
Plan Total Fractions Prescribed: 15
Plan Total Prescribed Dose: 40.05 Gy
Reference Point Dosage Given to Date: 21.36 Gy
Reference Point Session Dosage Given: 2.67 Gy
Session Number: 8

## 2022-07-06 ENCOUNTER — Other Ambulatory Visit: Payer: Self-pay

## 2022-07-06 ENCOUNTER — Ambulatory Visit
Admission: RE | Admit: 2022-07-06 | Discharge: 2022-07-06 | Disposition: A | Discharge status: Home / Self Care | Payer: Medicare PPO | Source: Ambulatory Visit | Attending: Radiation Oncology | Admitting: Radiation Oncology

## 2022-07-06 ENCOUNTER — Ambulatory Visit: Payer: Medicare PPO

## 2022-07-06 DIAGNOSIS — C50212 Malignant neoplasm of upper-inner quadrant of left female breast: Secondary | ICD-10-CM | POA: Diagnosis not present

## 2022-07-06 DIAGNOSIS — Z510 Encounter for antineoplastic radiation therapy: Secondary | ICD-10-CM | POA: Diagnosis not present

## 2022-07-06 DIAGNOSIS — Z170 Estrogen receptor positive status [ER+]: Secondary | ICD-10-CM | POA: Diagnosis not present

## 2022-07-06 LAB — RAD ONC ARIA SESSION SUMMARY
Course Elapsed Days: 12
Plan Fractions Treated to Date: 9
Plan Prescribed Dose Per Fraction: 2.67 Gy
Plan Total Fractions Prescribed: 15
Plan Total Prescribed Dose: 40.05 Gy
Reference Point Dosage Given to Date: 24.03 Gy
Reference Point Session Dosage Given: 2.67 Gy
Session Number: 9

## 2022-07-07 ENCOUNTER — Ambulatory Visit
Admission: RE | Admit: 2022-07-07 | Discharge: 2022-07-07 | Disposition: A | Discharge status: Home / Self Care | Payer: Medicare PPO | Source: Ambulatory Visit | Attending: Radiation Oncology | Admitting: Radiation Oncology

## 2022-07-07 ENCOUNTER — Other Ambulatory Visit: Payer: Self-pay

## 2022-07-07 DIAGNOSIS — Z510 Encounter for antineoplastic radiation therapy: Secondary | ICD-10-CM | POA: Diagnosis not present

## 2022-07-07 DIAGNOSIS — C50212 Malignant neoplasm of upper-inner quadrant of left female breast: Secondary | ICD-10-CM | POA: Diagnosis not present

## 2022-07-07 DIAGNOSIS — Z170 Estrogen receptor positive status [ER+]: Secondary | ICD-10-CM | POA: Diagnosis not present

## 2022-07-07 LAB — RAD ONC ARIA SESSION SUMMARY
Course Elapsed Days: 13
Plan Fractions Treated to Date: 10
Plan Prescribed Dose Per Fraction: 2.67 Gy
Plan Total Fractions Prescribed: 15
Plan Total Prescribed Dose: 40.05 Gy
Reference Point Dosage Given to Date: 26.7 Gy
Reference Point Session Dosage Given: 2.67 Gy
Session Number: 10

## 2022-07-08 ENCOUNTER — Ambulatory Visit
Admission: RE | Admit: 2022-07-08 | Discharge: 2022-07-08 | Disposition: A | Discharge status: Home / Self Care | Payer: Medicare PPO | Source: Ambulatory Visit | Attending: Radiation Oncology | Admitting: Radiation Oncology

## 2022-07-08 ENCOUNTER — Other Ambulatory Visit: Payer: Self-pay

## 2022-07-08 DIAGNOSIS — Z170 Estrogen receptor positive status [ER+]: Secondary | ICD-10-CM | POA: Diagnosis not present

## 2022-07-08 DIAGNOSIS — Z510 Encounter for antineoplastic radiation therapy: Secondary | ICD-10-CM | POA: Insufficient documentation

## 2022-07-08 DIAGNOSIS — C50212 Malignant neoplasm of upper-inner quadrant of left female breast: Secondary | ICD-10-CM | POA: Insufficient documentation

## 2022-07-08 LAB — RAD ONC ARIA SESSION SUMMARY
Course Elapsed Days: 14
Plan Fractions Treated to Date: 11
Plan Prescribed Dose Per Fraction: 2.67 Gy
Plan Total Fractions Prescribed: 15
Plan Total Prescribed Dose: 40.05 Gy
Reference Point Dosage Given to Date: 29.37 Gy
Reference Point Session Dosage Given: 2.67 Gy
Session Number: 11

## 2022-07-09 ENCOUNTER — Other Ambulatory Visit: Payer: Self-pay

## 2022-07-09 ENCOUNTER — Ambulatory Visit
Admission: RE | Admit: 2022-07-09 | Discharge: 2022-07-09 | Disposition: A | Discharge status: Home / Self Care | Payer: Medicare PPO | Source: Ambulatory Visit | Attending: Radiation Oncology | Admitting: Radiation Oncology

## 2022-07-09 DIAGNOSIS — Z170 Estrogen receptor positive status [ER+]: Secondary | ICD-10-CM | POA: Diagnosis not present

## 2022-07-09 DIAGNOSIS — Z510 Encounter for antineoplastic radiation therapy: Secondary | ICD-10-CM | POA: Diagnosis not present

## 2022-07-09 DIAGNOSIS — C50212 Malignant neoplasm of upper-inner quadrant of left female breast: Secondary | ICD-10-CM | POA: Diagnosis not present

## 2022-07-09 LAB — RAD ONC ARIA SESSION SUMMARY
Course Elapsed Days: 15
Plan Fractions Treated to Date: 12
Plan Prescribed Dose Per Fraction: 2.67 Gy
Plan Total Fractions Prescribed: 15
Plan Total Prescribed Dose: 40.05 Gy
Reference Point Dosage Given to Date: 32.04 Gy
Reference Point Session Dosage Given: 2.67 Gy
Session Number: 12

## 2022-07-10 ENCOUNTER — Ambulatory Visit
Admission: RE | Admit: 2022-07-10 | Discharge: 2022-07-10 | Disposition: A | Discharge status: Home / Self Care | Payer: Medicare PPO | Source: Ambulatory Visit | Attending: Radiation Oncology | Admitting: Radiation Oncology

## 2022-07-10 ENCOUNTER — Other Ambulatory Visit: Payer: Self-pay

## 2022-07-10 DIAGNOSIS — C50212 Malignant neoplasm of upper-inner quadrant of left female breast: Secondary | ICD-10-CM | POA: Diagnosis not present

## 2022-07-10 DIAGNOSIS — Z510 Encounter for antineoplastic radiation therapy: Secondary | ICD-10-CM | POA: Diagnosis not present

## 2022-07-10 DIAGNOSIS — Z170 Estrogen receptor positive status [ER+]: Secondary | ICD-10-CM | POA: Diagnosis not present

## 2022-07-10 LAB — RAD ONC ARIA SESSION SUMMARY
Course Elapsed Days: 16
Plan Fractions Treated to Date: 13
Plan Prescribed Dose Per Fraction: 2.67 Gy
Plan Total Fractions Prescribed: 15
Plan Total Prescribed Dose: 40.05 Gy
Reference Point Dosage Given to Date: 34.71 Gy
Reference Point Session Dosage Given: 2.67 Gy
Session Number: 13

## 2022-07-13 ENCOUNTER — Inpatient Hospital Stay: Payer: Medicare PPO | Admitting: Hematology and Oncology

## 2022-07-13 ENCOUNTER — Ambulatory Visit
Admission: RE | Admit: 2022-07-13 | Discharge: 2022-07-13 | Disposition: A | Discharge status: Home / Self Care | Payer: Medicare PPO | Source: Ambulatory Visit | Attending: Radiation Oncology | Admitting: Radiation Oncology

## 2022-07-13 ENCOUNTER — Other Ambulatory Visit: Payer: Self-pay

## 2022-07-13 ENCOUNTER — Ambulatory Visit: Payer: Medicare PPO

## 2022-07-13 VITALS — BP 140/64 | HR 76 | Temp 97.9°F | Resp 18 | Wt 188.0 lb

## 2022-07-13 DIAGNOSIS — C50212 Malignant neoplasm of upper-inner quadrant of left female breast: Secondary | ICD-10-CM | POA: Insufficient documentation

## 2022-07-13 DIAGNOSIS — Z510 Encounter for antineoplastic radiation therapy: Secondary | ICD-10-CM | POA: Diagnosis not present

## 2022-07-13 DIAGNOSIS — Z170 Estrogen receptor positive status [ER+]: Secondary | ICD-10-CM | POA: Insufficient documentation

## 2022-07-13 LAB — RAD ONC ARIA SESSION SUMMARY
Course Elapsed Days: 19
Plan Fractions Treated to Date: 14
Plan Prescribed Dose Per Fraction: 2.67 Gy
Plan Total Fractions Prescribed: 15
Plan Total Prescribed Dose: 40.05 Gy
Reference Point Dosage Given to Date: 37.38 Gy
Reference Point Session Dosage Given: 2.67 Gy
Session Number: 14

## 2022-07-13 MED ORDER — TAMOXIFEN CITRATE 20 MG PO TABS: 20.0000 mg | ORAL_TABLET | Freq: Every day | ORAL | 3 refills | Status: DC

## 2022-07-13 NOTE — Progress Notes (Unsigned)
[place] NOTE  Patient Care Team: Shelda Pal, DO as PCP - General (Family Medicine) Jovita Kussmaul, MD as Consulting Physician (General Surgery) Benay Pike, MD as Consulting Physician (Hematology and Oncology) Eppie Gibson, MD as Attending Physician (Radiation Oncology) Mauro Kaufmann, RN as Oncology Nurse Navigator Rockwell Germany, RN as Oncology Nurse Navigator  CHIEF COMPLAINTS/PURPOSE OF CONSULTATION:  Newly diagnosed breast cancer  HISTORY OF PRESENTING ILLNESS:  Lisa Morrison 71 y.o. female is here because of recent diagnosis of left breast cancer  I reviewed her records extensively and collaborated the history with the patient.  SUMMARY OF ONCOLOGIC HISTORY: Oncology History  Malignant neoplasm of upper-inner quadrant of left breast in female, estrogen receptor positive ([place])  04/08/2022 Mammogram   Screening mammogram shows distortion in the left breast warranting further evaluation.  No findings suspicious for malignancy in the right breast.  Left breast diagnostic mammogram confirmed a 6 mm mass in the 11 o'clock position of the left breast with imaging features highly suspicious for malignancy.  Ultrasound of the left axilla demonstrated normal-appearing left axillary lymph nodes   04/27/2022 Initial Diagnosis   Malignant neoplasm of upper-inner quadrant of left breast in female, estrogen receptor positive [facility])    Pathology Results   Pathology from the left breast biopsy showed low-grade invasive well-differentiated ductal adenocarcinoma along with low-grade DCIS showing ER 100% positive strong staining PR 100% positive strong staining, Ki-67 of less than 1% and HER2 negative 1+ by [facility]   05/22/2022 Pathology Results   She had left breast lumpectomy which showed 0.7 cm grade 1 invasive ductal carcinoma, low-grade DCIS, all margins negative for invasive carcinoma, all lymph nodes negative for metastatic carcinoma.    Interval  History  Patient is here for follow-up after surgery.  She will be meeting radiation oncology team soon as well.  She denies any new health complaints today.  She is understandably worried about having to take antiestrogen therapy. She is not quite sure which when she would like to try for antiestrogen therapy Rest of the pertinent 10 point ROS reviewed and negative  MEDICAL HISTORY:  Past Medical History:  Diagnosis Date   Anxiety    Arthritis    Breast cancer ([place])    CFS (chronic fatigue syndrome)    Depression    denies now   Dyspnea    Edema    due to taking lyrica   Family history of adverse reaction to anesthesia    son had n/v   Family history of pancreatic cancer 05/03/2022   Fibromyalgia    IBS (irritable bowel syndrome)    Leaky heart valve    Migraine    Orthostatic hypotension    from a  previous surgery    PONV (postoperative nausea and vomiting)    and heart rate goes down " I fainted after surgery when standing in the restroom."   Seasonal allergies    Spondylolisthesis of lumbar region    Wears glasses     SURGICAL HISTORY: Past Surgical History:  Procedure Laterality Date   ABDOMINAL HYSTERECTOMY     back ablasion     03/2018 and 05/2018   back stimulator  2022   BACK SURGERY  2015   BREAST LUMPECTOMY WITH RADIOACTIVE SEED AND SENTINEL LYMPH NODE BIOPSY Left 05/22/2022   Procedure: LEFT BREAST LUMPECTOMY WITH RADIOACTIVE SEED AND SENTINEL LYMPH NODE BIOPSY;  Surgeon: Jovita Kussmaul, MD;  Location: [facility];  Service: General;  Laterality: Left;   CHOLECYSTECTOMY     HERNIA REPAIR     x2   OOPHORECTOMY     VARICOSE VEIN SURGERY      SOCIAL HISTORY: Social History   Socioeconomic History   Marital status: Married    Spouse name: Not on file   Number of children: Not on file   Years of education: Not on file   Highest education level: Not on file  Occupational History   Not on file  Tobacco Use   Smoking status: Never     Passive exposure: Past   Smokeless tobacco: Never  Vaping Use   Vaping Use: Never used  Substance and Sexual Activity   Alcohol use: Never   Drug use: Never   Sexual activity: Not Currently  Other Topics Concern   Not on file  Social History Narrative   Not on file   Social Determinants of Health   Financial Resource Strain: Low Risk  (05/29/2021)   Overall Financial Resource Strain (CARDIA)    Difficulty of Paying Living Expenses: Not hard at all  Food Insecurity: No Food Insecurity (05/29/2021)   Hunger Vital Sign    Worried About Running Out of Food in the Last Year: Never true    [place] in the Last Year: Never true  Transportation Needs: No Transportation Needs (05/29/2021)   PRAPARE - Hydrologist (Medical): No    Lack of Transportation (Non-Medical): No  Physical Activity: Inactive (05/29/2021)   Exercise Vital Sign    Days of Exercise per Week: 0 days    Minutes of Exercise per Session: 0 min  Stress: No Stress Concern Present (05/29/2021)   [place]    Feeling of Stress : Not at all  Social Connections: Moderately Integrated (05/29/2021)   Social Connection and Isolation Panel [NHANES]    Frequency of Communication with Friends and Family: More than three times a week    Frequency of Social Gatherings with Friends and Family: More than three times a week    Attends Religious Services: Never    Marine scientist or Organizations: Yes    Attends Archivist Meetings: 1 to 4 times per year    Marital Status: Married  Human resources officer Violence: Not At Risk (05/29/2021)   Humiliation, Afraid, Rape, and Kick questionnaire    Fear of Current or Ex-Partner: No    Emotionally Abused: No    Physically Abused: No    Sexually Abused: No    FAMILY HISTORY: Family History  Problem Relation Age of Onset   Mitral valve prolapse Mother    Coronary aneurysm Father     Diabetes Father    Pancreatic cancer Maternal Uncle 52   Healthy Son     ALLERGIES:  is allergic to augmentin [amoxicillin-pot clavulanate], demerol [meperidine], amoxicillin, and clavulanic acid.  MEDICATIONS:  Current Outpatient Medications  Medication Sig Dispense Refill   acetaminophen (TYLENOL) 650 MG CR tablet Take 1,300 mg by mouth 2 (two) times daily as needed.     ADVAIR HFA 230-21 MCG/ACT inhaler Inhale 2 puffs into the lungs 2 (two) times daily.     buPROPion (WELLBUTRIN SR) 150 MG 12 hr tablet TAKE 1 TABLET BY MOUTH TWICE A DAY 180 tablet 1   Calcium Carbonate-Vit D-Min (CALTRATE 600+D PLUS MINERALS) 600-800 MG-UNIT TABS Take by mouth.     EPIPEN 2-PAK 0.3 MG/0.3ML SOAJ injection  levocetirizine (XYZAL) 5 MG tablet      meloxicam (MOBIC) 7.5 MG tablet      montelukast (SINGULAIR) 10 MG tablet Take by mouth.     nortriptyline (PAMELOR) 10 MG capsule TAKE 1-2 CAPSULES (10-20 MG TOTAL) BY MOUTH AT BEDTIME. 180 capsule 2   potassium chloride (MICRO-K) 10 MEQ CR capsule TAKE 2 CAPSULES BY MOUTH EVERY DAY 180 capsule 1   pregabalin (LYRICA) 150 MG capsule TAKE 1 CAPSULE BY MOUTH EVERY NIGHT 90 capsule 1   Prucalopride Succinate (MOTEGRITY) 2 MG TABS      topiramate (TOPAMAX) 50 MG tablet TAKE 1 TABLET BY MOUTH TWICE DAILY. 180 tablet 1   triamterene-hydrochlorothiazide (DYAZIDE) 37.5-25 MG capsule TAKE 1 EACH (1 CAPSULE TOTAL) BY MOUTH DAILY. 90 capsule 1   No current facility-administered medications for this visit.    REVIEW OF SYSTEMS:   Constitutional: Denies fevers, chills or abnormal night sweats Eyes: Denies blurriness of vision, double vision or watery eyes Ears, nose, mouth, throat, and face: Denies mucositis or sore throat Respiratory: Denies cough, dyspnea or wheezes Cardiovascular: Denies palpitation, chest discomfort or lower extremity swelling Gastrointestinal:  Denies nausea, heartburn or change in bowel habits Skin: Denies abnormal skin  rashes Lymphatics: Denies new lymphadenopathy or easy bruising Neurological:Denies numbness, tingling or new weaknesses Behavioral/Psych: Mood is stable, no new changes  Breast: Denies any palpable lumps or discharge All other systems were reviewed with the patient and are negative.  PHYSICAL EXAMINATION: ECOG PERFORMANCE STATUS: 0 - Asymptomatic  Vitals:   07/13/22 1056  BP: (!) 140/64  Pulse: 76  Resp: 18  Temp: 97.9 F (36.6 C)  SpO2: 100%    Filed Weights   07/13/22 1056  Weight: 188 lb (85.3 kg)    General appearance: Alert oriented and in no acute distress Rest of the physical exam deferred in lieu of counseling  LABORATORY DATA:  I have reviewed the data as listed Lab Results  Component Value Date   WBC 6.0 04/29/2022   HGB 15.5 (H) 04/29/2022   HCT 44.7 04/29/2022   MCV 89.9 04/29/2022   PLT 269 04/29/2022   Lab Results  Component Value Date   NA 137 04/29/2022   K 3.1 (L) 04/29/2022   CL 100 04/29/2022   CO2 30 04/29/2022    RADIOGRAPHIC STUDIES: I have personally reviewed the radiological reports and agreed with the findings in the report.  ASSESSMENT AND PLAN:  No problem-specific Assessment & Plan notes found for this encounter.  Total time spent: 30 minutes including history,  review of records, counseling and coordination of care  All questions were answered. The patient knows to call the clinic with any problems, questions or concerns.    Benay Pike, MD 07/13/22

## 2022-07-13 NOTE — Progress Notes (Signed)
[name] NOTE  Patient Care Team: Shelda Pal, DO as PCP - General (Family Medicine) Jovita Kussmaul, MD as Consulting Physician (General Surgery) Benay Pike, MD as Consulting Physician (Hematology and Oncology) Eppie Gibson, MD as Attending Physician (Radiation Oncology) Mauro Kaufmann, RN as Oncology Nurse Navigator Rockwell Germany, RN as Oncology Nurse Navigator  CHIEF COMPLAINTS/PURPOSE OF CONSULTATION:  Newly diagnosed breast cancer  HISTORY OF PRESENTING ILLNESS:  Lisa Morrison 71 y.o. female is here because of recent diagnosis of left breast cancer  I reviewed her records extensively and collaborated the history with the patient.  SUMMARY OF ONCOLOGIC HISTORY: Oncology History  Malignant neoplasm of upper-inner quadrant of left breast in female, estrogen receptor positive ([place])  04/08/2022 Mammogram   Screening mammogram shows distortion in the left breast warranting further evaluation.  No findings suspicious for malignancy in the right breast.  Left breast diagnostic mammogram confirmed a 6 mm mass in the 11 o'clock position of the left breast with imaging features highly suspicious for malignancy.  Ultrasound of the left axilla demonstrated normal-appearing left axillary lymph nodes   04/27/2022 Initial Diagnosis   Malignant neoplasm of upper-inner quadrant of left breast in female, estrogen receptor positive [facility])    Pathology Results   Pathology from the left breast biopsy showed low-grade invasive well-differentiated ductal adenocarcinoma along with low-grade DCIS showing ER 100% positive strong staining PR 100% positive strong staining, Ki-67 of less than 1% and HER2 negative 1+ by [facility]   05/22/2022 Pathology Results   She had left breast lumpectomy which showed 0.7 cm grade 1 invasive ductal carcinoma, low-grade DCIS, all margins negative for invasive carcinoma, all lymph nodes negative for metastatic carcinoma.    Interval  History  Patient is here for follow-up after surgery.  She is almost done with radiation has the boost left.  So far she has tolerated it really well.  At this time she is leaning towards trying tamoxifen.  Rest of the pertinent 10 point ROS reviewed and negative  MEDICAL HISTORY:  Past Medical History:  Diagnosis Date   Anxiety    Arthritis    Breast cancer ([place])    CFS (chronic fatigue syndrome)    Depression    denies now   Dyspnea    Edema    due to taking lyrica   Family history of adverse reaction to anesthesia    son had n/v   Family history of pancreatic cancer 05/03/2022   Fibromyalgia    IBS (irritable bowel syndrome)    Leaky heart valve    Migraine    Orthostatic hypotension    from a  previous surgery    PONV (postoperative nausea and vomiting)    and heart rate goes down " I fainted after surgery when standing in the restroom."   Seasonal allergies    Spondylolisthesis of lumbar region    Wears glasses     SURGICAL HISTORY: Past Surgical History:  Procedure Laterality Date   ABDOMINAL HYSTERECTOMY     back ablasion     03/2018 and 05/2018   back stimulator  2022   BACK SURGERY  2015   BREAST LUMPECTOMY WITH RADIOACTIVE SEED AND SENTINEL LYMPH NODE BIOPSY Left 05/22/2022   Procedure: LEFT BREAST LUMPECTOMY WITH RADIOACTIVE SEED AND SENTINEL LYMPH NODE BIOPSY;  Surgeon: Jovita Kussmaul, MD;  Location: [facility];  Service: General;  Laterality: Left;   [place]  x2   OOPHORECTOMY     VARICOSE VEIN SURGERY      SOCIAL HISTORY: Social History   Socioeconomic History   Marital status: Married    Spouse name: Not on file   Number of children: Not on file   Years of education: Not on file   Highest education level: Not on file  Occupational History   Not on file  Tobacco Use   Smoking status: Never    Passive exposure: Past   Smokeless tobacco: Never  Vaping Use   Vaping Use: Never used  Substance and  Sexual Activity   Alcohol use: Never   Drug use: Never   Sexual activity: Not Currently  Other Topics Concern   Not on file  Social History Narrative   Not on file   Social Determinants of Health   Financial Resource Strain: Low Risk  (05/29/2021)   Overall Financial Resource Strain (CARDIA)    Difficulty of Paying Living Expenses: Not hard at all  Food Insecurity: No Food Insecurity (05/29/2021)   Hunger Vital Sign    Worried About Running Out of Food in the Last Year: Never true    [place] in the Last Year: Never true  Transportation Needs: No Transportation Needs (05/29/2021)   PRAPARE - Hydrologist (Medical): No    Lack of Transportation (Non-Medical): No  Physical Activity: Inactive (05/29/2021)   Exercise Vital Sign    Days of Exercise per Week: 0 days    Minutes of Exercise per Session: 0 min  Stress: No Stress Concern Present (05/29/2021)   [place]    Feeling of Stress : Not at all  Social Connections: Moderately Integrated (05/29/2021)   Social Connection and Isolation Panel [NHANES]    Frequency of Communication with Friends and Family: More than three times a week    Frequency of Social Gatherings with Friends and Family: More than three times a week    Attends Religious Services: Never    Marine scientist or Organizations: Yes    Attends Archivist Meetings: 1 to 4 times per year    Marital Status: Married  Human resources officer Violence: Not At Risk (05/29/2021)   Humiliation, Afraid, Rape, and Kick questionnaire    Fear of Current or Ex-Partner: No    Emotionally Abused: No    Physically Abused: No    Sexually Abused: No    FAMILY HISTORY: Family History  Problem Relation Age of Onset   Mitral valve prolapse Mother    Coronary aneurysm Father    Diabetes Father    Pancreatic cancer Maternal Uncle 56   Healthy Son     ALLERGIES:  is allergic  to augmentin [amoxicillin-pot clavulanate], demerol [meperidine], amoxicillin, and clavulanic acid.  MEDICATIONS:  Current Outpatient Medications  Medication Sig Dispense Refill   tamoxifen (NOLVADEX) 20 MG tablet Take 1 tablet (20 mg total) by mouth daily. 90 tablet 3   acetaminophen (TYLENOL) 650 MG CR tablet Take 1,300 mg by mouth 2 (two) times daily as needed.     ADVAIR HFA 230-21 MCG/ACT inhaler Inhale 2 puffs into the lungs 2 (two) times daily.     buPROPion (WELLBUTRIN SR) 150 MG 12 hr tablet TAKE 1 TABLET BY MOUTH TWICE A DAY 180 tablet 1   Calcium Carbonate-Vit D-Min (CALTRATE 600+D PLUS MINERALS) 600-800 MG-UNIT TABS Take by mouth.     EPIPEN 2-PAK 0.3 MG/0.3ML SOAJ  injection      levocetirizine (XYZAL) 5 MG tablet      meloxicam (MOBIC) 7.5 MG tablet      montelukast (SINGULAIR) 10 MG tablet Take by mouth.     nortriptyline (PAMELOR) 10 MG capsule TAKE 1-2 CAPSULES (10-20 MG TOTAL) BY MOUTH AT BEDTIME. 180 capsule 2   potassium chloride (MICRO-K) 10 MEQ CR capsule TAKE 2 CAPSULES BY MOUTH EVERY DAY 180 capsule 1   pregabalin (LYRICA) 150 MG capsule TAKE 1 CAPSULE BY MOUTH EVERY NIGHT 90 capsule 1   Prucalopride Succinate (MOTEGRITY) 2 MG TABS      topiramate (TOPAMAX) 50 MG tablet TAKE 1 TABLET BY MOUTH TWICE DAILY. 180 tablet 1   triamterene-hydrochlorothiazide (DYAZIDE) 37.5-25 MG capsule TAKE 1 EACH (1 CAPSULE TOTAL) BY MOUTH DAILY. 90 capsule 1   No current facility-administered medications for this visit.    REVIEW OF SYSTEMS:   Constitutional: Denies fevers, chills or abnormal night sweats Eyes: Denies blurriness of vision, double vision or watery eyes Ears, nose, mouth, throat, and face: Denies mucositis or sore throat Respiratory: Denies cough, dyspnea or wheezes Cardiovascular: Denies palpitation, chest discomfort or lower extremity swelling Gastrointestinal:  Denies nausea, heartburn or change in bowel habits Skin: Denies abnormal skin rashes Lymphatics: Denies  new lymphadenopathy or easy bruising Neurological:Denies numbness, tingling or new weaknesses Behavioral/Psych: Mood is stable, no new changes  Breast: Denies any palpable lumps or discharge All other systems were reviewed with the patient and are negative.  PHYSICAL EXAMINATION: ECOG PERFORMANCE STATUS: 0 - Asymptomatic  Vitals:   07/13/22 1056  BP: (!) 140/64  Pulse: 76  Resp: 18  Temp: 97.9 F (36.6 C)  SpO2: 100%    Filed Weights   07/13/22 1056  Weight: 188 lb (85.3 kg)    General appearance: Alert oriented and in no acute distress  LABORATORY DATA:  I have reviewed the data as listed Lab Results  Component Value Date   WBC 6.0 04/29/2022   HGB 15.5 (H) 04/29/2022   HCT 44.7 04/29/2022   MCV 89.9 04/29/2022   PLT 269 04/29/2022   Lab Results  Component Value Date   NA 137 04/29/2022   K 3.1 (L) 04/29/2022   CL 100 04/29/2022   CO2 30 04/29/2022    RADIOGRAPHIC STUDIES: I have personally reviewed the radiological reports and agreed with the findings in the report.  ASSESSMENT AND PLAN:  Malignant neoplasm of upper-inner quadrant of left breast in female, estrogen receptor positive ([place]) This is a very pleasant 71 year old postmenopausal female patient with newly diagnosed left breast grade 1 IDC, ER/PR strongly positive, HER2 negative by IHC 1+, low proliferation index Ki-67 of less than 1% referred to breast [place] for additional recommendations.  Given strong ER/PR positivity, low-grade and low proliferation index, I do not see any indication for Oncotype testing.   She is now almost done with adjuvant radiation and is here to discuss once again about antiestrogen therapy.  She wants to proceed with tamoxifen.  We have once again discussed about the mechanism of action of tamoxifen, adverse effects including but not limited to postmenopausal symptoms, small risk of DVT/PE, endometrial cancer.  A benefit from tamoxifen would be improvement in bone density. She was  encouraged to complete the boost as planned and start tamoxifen from 1 December.  She will return to clinic in about 3 months for survivorship clinic visit and toxicity check.  She understands that she should call us sooner with any unexpected symptoms. There is  a small interaction with Wellbutrin as well as tamoxifen however since Wellbutrin is a long-term medication of hers, we have decided not to alter this.  She understands that there may be less bioavailability of tamoxifen with concomitant use. Total time spent: 30 minutes including history,  review of records, counseling and coordination of care  All questions were answered. The patient knows to call the clinic with any problems, questions or concerns.    Benay Pike, MD 07/14/22

## 2022-07-14 ENCOUNTER — Other Ambulatory Visit: Payer: Self-pay

## 2022-07-14 ENCOUNTER — Encounter: Payer: Self-pay | Admitting: Hematology and Oncology

## 2022-07-14 ENCOUNTER — Ambulatory Visit
Admission: RE | Admit: 2022-07-14 | Discharge: 2022-07-14 | Disposition: A | Discharge status: Home / Self Care | Payer: Medicare PPO | Source: Ambulatory Visit | Attending: Radiation Oncology | Admitting: Radiation Oncology

## 2022-07-14 DIAGNOSIS — Z170 Estrogen receptor positive status [ER+]: Secondary | ICD-10-CM

## 2022-07-14 DIAGNOSIS — C50212 Malignant neoplasm of upper-inner quadrant of left female breast: Secondary | ICD-10-CM | POA: Diagnosis not present

## 2022-07-14 DIAGNOSIS — Z510 Encounter for antineoplastic radiation therapy: Secondary | ICD-10-CM | POA: Diagnosis not present

## 2022-07-14 LAB — RAD ONC ARIA SESSION SUMMARY
Course Elapsed Days: 20
Plan Fractions Treated to Date: 15
Plan Prescribed Dose Per Fraction: 2.67 Gy
Plan Total Fractions Prescribed: 15
Plan Total Prescribed Dose: 40.05 Gy
Reference Point Dosage Given to Date: 40.05 Gy
Reference Point Session Dosage Given: 2.67 Gy
Session Number: 15

## 2022-07-14 MED ORDER — RADIAPLEXRX EX GEL: Freq: Once | CUTANEOUS | Status: AC

## 2022-07-14 NOTE — Assessment & Plan Note (Signed)
This is a very pleasant 71 year old postmenopausal female patient with newly diagnosed left breast grade 1 IDC, ER/PR strongly positive, HER2 negative by IHC 1+, low proliferation index Ki-67 of less than 1% referred to breast [place] for additional recommendations.  Given strong ER/PR positivity, low-grade and low proliferation index, I do not see any indication for Oncotype testing.   She is now almost done with adjuvant radiation and is here to discuss once again about antiestrogen therapy.  She wants to proceed with tamoxifen.  We have once again discussed about the mechanism of action of tamoxifen, adverse effects including but not limited to postmenopausal symptoms, small risk of DVT/PE, endometrial cancer.  A benefit from tamoxifen would be improvement in bone density. She was encouraged to complete the boost as planned and start tamoxifen from 1 December.  She will return to clinic in about 3 months for survivorship clinic visit and toxicity check.  She understands that she should call us sooner with any unexpected symptoms.

## 2022-07-15 ENCOUNTER — Ambulatory Visit
Admission: RE | Admit: 2022-07-15 | Discharge: 2022-07-15 | Disposition: A | Discharge status: Home / Self Care | Payer: Medicare PPO | Source: Ambulatory Visit | Attending: Radiation Oncology | Admitting: Radiation Oncology

## 2022-07-15 ENCOUNTER — Other Ambulatory Visit: Payer: Self-pay

## 2022-07-15 DIAGNOSIS — C50212 Malignant neoplasm of upper-inner quadrant of left female breast: Secondary | ICD-10-CM | POA: Diagnosis not present

## 2022-07-15 DIAGNOSIS — J3081 Allergic rhinitis due to animal (cat) (dog) hair and dander: Secondary | ICD-10-CM | POA: Diagnosis not present

## 2022-07-15 DIAGNOSIS — J301 Allergic rhinitis due to pollen: Secondary | ICD-10-CM | POA: Diagnosis not present

## 2022-07-15 DIAGNOSIS — J3089 Other allergic rhinitis: Secondary | ICD-10-CM | POA: Diagnosis not present

## 2022-07-15 DIAGNOSIS — Z170 Estrogen receptor positive status [ER+]: Secondary | ICD-10-CM | POA: Diagnosis not present

## 2022-07-15 DIAGNOSIS — Z510 Encounter for antineoplastic radiation therapy: Secondary | ICD-10-CM | POA: Diagnosis not present

## 2022-07-15 LAB — RAD ONC ARIA SESSION SUMMARY
Course Elapsed Days: 21
Plan Fractions Treated to Date: 1
Plan Prescribed Dose Per Fraction: 2 Gy
Plan Total Fractions Prescribed: 5
Plan Total Prescribed Dose: 10 Gy
Reference Point Dosage Given to Date: 2 Gy
Reference Point Session Dosage Given: 2 Gy
Session Number: 16

## 2022-07-16 ENCOUNTER — Other Ambulatory Visit: Payer: Self-pay

## 2022-07-16 ENCOUNTER — Ambulatory Visit
Admission: RE | Admit: 2022-07-16 | Discharge: 2022-07-16 | Disposition: A | Discharge status: Home / Self Care | Payer: Medicare PPO | Source: Ambulatory Visit | Attending: Radiation Oncology | Admitting: Radiation Oncology

## 2022-07-16 DIAGNOSIS — Z170 Estrogen receptor positive status [ER+]: Secondary | ICD-10-CM | POA: Diagnosis not present

## 2022-07-16 DIAGNOSIS — C50212 Malignant neoplasm of upper-inner quadrant of left female breast: Secondary | ICD-10-CM | POA: Diagnosis not present

## 2022-07-16 DIAGNOSIS — Z510 Encounter for antineoplastic radiation therapy: Secondary | ICD-10-CM | POA: Diagnosis not present

## 2022-07-16 LAB — RAD ONC ARIA SESSION SUMMARY
Course Elapsed Days: 22
Plan Fractions Treated to Date: 2
Plan Prescribed Dose Per Fraction: 2 Gy
Plan Total Fractions Prescribed: 5
Plan Total Prescribed Dose: 10 Gy
Reference Point Dosage Given to Date: 4 Gy
Reference Point Session Dosage Given: 2 Gy
Session Number: 17

## 2022-07-17 ENCOUNTER — Other Ambulatory Visit: Payer: Self-pay

## 2022-07-17 ENCOUNTER — Ambulatory Visit
Admission: RE | Admit: 2022-07-17 | Discharge: 2022-07-17 | Disposition: A | Discharge status: Home / Self Care | Payer: Medicare PPO | Source: Ambulatory Visit | Attending: Radiation Oncology | Admitting: Radiation Oncology

## 2022-07-17 DIAGNOSIS — Z510 Encounter for antineoplastic radiation therapy: Secondary | ICD-10-CM | POA: Diagnosis not present

## 2022-07-17 DIAGNOSIS — Z170 Estrogen receptor positive status [ER+]: Secondary | ICD-10-CM | POA: Diagnosis not present

## 2022-07-17 DIAGNOSIS — C50212 Malignant neoplasm of upper-inner quadrant of left female breast: Secondary | ICD-10-CM | POA: Diagnosis not present

## 2022-07-17 LAB — RAD ONC ARIA SESSION SUMMARY
Course Elapsed Days: 23
Plan Fractions Treated to Date: 3
Plan Prescribed Dose Per Fraction: 2 Gy
Plan Total Fractions Prescribed: 5
Plan Total Prescribed Dose: 10 Gy
Reference Point Dosage Given to Date: 6 Gy
Reference Point Session Dosage Given: 2 Gy
Session Number: 18

## 2022-07-20 ENCOUNTER — Ambulatory Visit
Admission: RE | Admit: 2022-07-20 | Discharge: 2022-07-20 | Disposition: A | Discharge status: Home / Self Care | Payer: Medicare PPO | Source: Ambulatory Visit | Attending: Radiation Oncology | Admitting: Radiation Oncology

## 2022-07-20 ENCOUNTER — Other Ambulatory Visit: Payer: Self-pay

## 2022-07-20 ENCOUNTER — Ambulatory Visit: Payer: Medicare PPO

## 2022-07-20 ENCOUNTER — Encounter: Payer: Self-pay | Admitting: *Deleted

## 2022-07-20 DIAGNOSIS — Z170 Estrogen receptor positive status [ER+]: Secondary | ICD-10-CM | POA: Diagnosis not present

## 2022-07-20 DIAGNOSIS — Z510 Encounter for antineoplastic radiation therapy: Secondary | ICD-10-CM | POA: Diagnosis not present

## 2022-07-20 DIAGNOSIS — C50212 Malignant neoplasm of upper-inner quadrant of left female breast: Secondary | ICD-10-CM | POA: Diagnosis not present

## 2022-07-20 LAB — RAD ONC ARIA SESSION SUMMARY
Course Elapsed Days: 26
Plan Fractions Treated to Date: 4
Plan Prescribed Dose Per Fraction: 2 Gy
Plan Total Fractions Prescribed: 5
Plan Total Prescribed Dose: 10 Gy
Reference Point Dosage Given to Date: 8 Gy
Reference Point Session Dosage Given: 2 Gy
Session Number: 19

## 2022-07-20 MED ORDER — SONAFINE EX EMUL: 1.0000 | Freq: Two times a day (BID) | CUTANEOUS | Status: DC

## 2022-07-21 ENCOUNTER — Encounter: Payer: Self-pay | Admitting: Radiation Oncology

## 2022-07-21 ENCOUNTER — Other Ambulatory Visit: Payer: Self-pay

## 2022-07-21 ENCOUNTER — Ambulatory Visit
Admission: RE | Admit: 2022-07-21 | Discharge: 2022-07-21 | Disposition: A | Discharge status: Home / Self Care | Payer: Medicare PPO | Source: Ambulatory Visit | Attending: Radiation Oncology | Admitting: Radiation Oncology

## 2022-07-21 DIAGNOSIS — Z170 Estrogen receptor positive status [ER+]: Secondary | ICD-10-CM | POA: Diagnosis not present

## 2022-07-21 DIAGNOSIS — C50212 Malignant neoplasm of upper-inner quadrant of left female breast: Secondary | ICD-10-CM | POA: Diagnosis not present

## 2022-07-21 DIAGNOSIS — Z510 Encounter for antineoplastic radiation therapy: Secondary | ICD-10-CM | POA: Diagnosis not present

## 2022-07-21 LAB — RAD ONC ARIA SESSION SUMMARY
Course Elapsed Days: 27
Plan Fractions Treated to Date: 5
Plan Prescribed Dose Per Fraction: 2 Gy
Plan Total Fractions Prescribed: 5
Plan Total Prescribed Dose: 10 Gy
Reference Point Dosage Given to Date: 10 Gy
Reference Point Session Dosage Given: 2 Gy
Session Number: 20

## 2022-07-29 DIAGNOSIS — J301 Allergic rhinitis due to pollen: Secondary | ICD-10-CM | POA: Diagnosis not present

## 2022-07-29 DIAGNOSIS — J3089 Other allergic rhinitis: Secondary | ICD-10-CM | POA: Diagnosis not present

## 2022-07-29 DIAGNOSIS — J3081 Allergic rhinitis due to animal (cat) (dog) hair and dander: Secondary | ICD-10-CM | POA: Diagnosis not present

## 2022-08-03 NOTE — Progress Notes (Signed)
                                                                                                                                                             Patient Name: Lisa Morrison MRN: [id number] DOB: [birth year] Referring Physician: Benay Pike Date of Service: 07/21/2022 [facility] [place], [place]                                                        End Of Treatment Note  Diagnoses: C50.212-Malignant neoplasm of upper-inner quadrant of left female breast  Cancer Staging:  Cancer Staging  Malignant neoplasm of upper-inner quadrant of left breast in female, estrogen receptor positive ([place]) Staging form: Breast, AJCC 8th Edition - Clinical stage from 04/29/2022: Stage IA (cT1b, cN0, cM0, G1, ER+, PR+, HER2-) - Unsigned Stage prefix: Initial diagnosis Histologic grading system: 3 grade system  Intent: Curative  Radiation Treatment Dates: 06/24/2022 through 07/21/2022 Site Technique Total Dose (Gy) Dose per Fx (Gy) Completed Fx Beam Energies  Breast, Left: Breast_L 3D 40.05/40.05 2.67 15/15 6XFFF, 10XFFF  Breast, Left: Breast_L_Bst specialPort 10/10 2 5/5 12E   Narrative: The patient tolerated radiation therapy relatively well.   Plan: The patient will follow-up with radiation oncology in 28mo -----------------------------------  SEppie Gibson MD

## 2022-08-06 ENCOUNTER — Ambulatory Visit: Payer: Medicare PPO | Admitting: Podiatry

## 2022-08-06 DIAGNOSIS — Q828 Other specified congenital malformations of skin: Secondary | ICD-10-CM

## 2022-08-06 DIAGNOSIS — B351 Tinea unguium: Secondary | ICD-10-CM | POA: Diagnosis not present

## 2022-08-06 DIAGNOSIS — M79675 Pain in left toe(s): Secondary | ICD-10-CM | POA: Diagnosis not present

## 2022-08-06 DIAGNOSIS — M79674 Pain in right toe(s): Secondary | ICD-10-CM

## 2022-08-09 NOTE — Progress Notes (Signed)
Subjective: Chief Complaint  Patient presents with   Nail Problem    Routine foot care, nail trim and left foot callus     71 year old female presents the office they for concerns of elongated, thick toenails that are hard for her to trim and for a mild callus on the left foot.  She said the calluses been getting bigger but she has been on her feet more.  No open lesions.  No other concerns.  Objective: AAO x3, NAD DP/PT pulses palpable bilaterally, CRT less than 3 seconds Nails are hypertrophic, dystrophic, brittle, discolored, elongated 10. No surrounding redness or drainage. Tenderness nails 1-5 bilaterally. No open lesions or pre-ulcerative lesions are identified today. Hyperkeratotic tissue is present left foot medial hallux and left submetatarsal area.  There is no ongoing ulceration drainage or signs of infection. Hammertoes present bilaterally No pain with calf compression, swelling, warmth, erythema No acute changes otherwsie  Assessment: Symptomatic onychomycosis, hyperkeratotic lesion  Plan: -All treatment options discussed with the patient including all alternatives, risks, complications.  -Nails sharply debrided x 10 without complications or bleeding.  -Hyperkeratotic lesions sharply debrided x 1 without any complications or bleeding. Continue with supportive shoes for offloading. -Daily foot inspection  Celesta Gentile, DPM

## 2022-08-12 DIAGNOSIS — J301 Allergic rhinitis due to pollen: Secondary | ICD-10-CM | POA: Diagnosis not present

## 2022-08-12 DIAGNOSIS — J3089 Other allergic rhinitis: Secondary | ICD-10-CM | POA: Diagnosis not present

## 2022-08-12 DIAGNOSIS — J3081 Allergic rhinitis due to animal (cat) (dog) hair and dander: Secondary | ICD-10-CM | POA: Diagnosis not present

## 2022-08-20 ENCOUNTER — Telehealth: Payer: Self-pay

## 2022-08-20 ENCOUNTER — Encounter: Payer: Self-pay | Admitting: Radiation Oncology

## 2022-08-20 ENCOUNTER — Ambulatory Visit
Admission: RE | Admit: 2022-08-20 | Discharge: 2022-08-20 | Disposition: A | Discharge status: Home / Self Care | Payer: Medicare PPO | Source: Ambulatory Visit | Attending: Radiation Oncology | Admitting: Radiation Oncology

## 2022-08-20 DIAGNOSIS — C50212 Malignant neoplasm of upper-inner quadrant of left female breast: Secondary | ICD-10-CM

## 2022-08-20 NOTE — Telephone Encounter (Signed)
I called the patient today about her upcoming follow-up appointment in radiation oncology.   Given the state of the COVID-19 pandemic, concerning case numbers in our community, and guidance from [facility], I offered a phone assessment with the patient to determine if coming to the clinic was necessary. She accepted.  The patient denies any symptomatic concerns.  Specifically, they report good healing of their skin in the radiation fields.  Skin is intact. She did states she has some mild burning sensation in her breast intermittently but nothing concerning. She stated this is improving. She reports already using a vitamin E lotion. She had questions on mammogram follow-up and I encouraged her to keep her current mammogram timing, which is due in June.   I recommended that she continue skin care by applying oil or lotion with vitamin E to the skin in the radiation fields, BID, for 2 more months.  Continue follow-up with medical oncology - follow-up is scheduled on 11-10-22 with Wilber Bihari NP for survivorship.  I explained that yearly mammograms are important for patients with intact breast tissue, and physical exams are important after mastectomy for patients that cannot undergo mammography.  I encouraged her to call if she had further questions or concerns about her healing. Otherwise, she will follow-up PRN in radiation oncology. Patient is pleased with this plan, and we will cancel her upcoming follow-up to reduce the risk of COVID-19 transmission.

## 2022-08-23 ENCOUNTER — Other Ambulatory Visit: Payer: Self-pay | Admitting: Family Medicine

## 2022-08-24 ENCOUNTER — Ambulatory Visit: Payer: Medicare PPO

## 2022-08-26 DIAGNOSIS — J3081 Allergic rhinitis due to animal (cat) (dog) hair and dander: Secondary | ICD-10-CM | POA: Diagnosis not present

## 2022-08-26 DIAGNOSIS — J301 Allergic rhinitis due to pollen: Secondary | ICD-10-CM | POA: Diagnosis not present

## 2022-08-26 DIAGNOSIS — J3089 Other allergic rhinitis: Secondary | ICD-10-CM | POA: Diagnosis not present

## 2022-09-02 ENCOUNTER — Encounter: Payer: Self-pay | Admitting: Family Medicine

## 2022-09-02 ENCOUNTER — Ambulatory Visit: Payer: Medicare PPO | Admitting: Family Medicine

## 2022-09-02 VITALS — BP 130/80 | HR 80 | Temp 98.2°F | Resp 18 | Ht 64.0 in | Wt 184.2 lb

## 2022-09-02 DIAGNOSIS — J208 Acute bronchitis due to other specified organisms: Secondary | ICD-10-CM | POA: Diagnosis not present

## 2022-09-02 DIAGNOSIS — B9689 Other specified bacterial agents as the cause of diseases classified elsewhere: Secondary | ICD-10-CM | POA: Diagnosis not present

## 2022-09-02 MED ORDER — DOXYCYCLINE HYCLATE 100 MG PO TABS: 100.0000 mg | ORAL_TABLET | Freq: Two times a day (BID) | ORAL | 0 refills | Status: AC

## 2022-09-02 MED ORDER — PROMETHAZINE-DM 6.25-15 MG/5ML PO SYRP: 5.0000 mL | ORAL_SOLUTION | Freq: Four times a day (QID) | ORAL | 0 refills | Status: DC | PRN

## 2022-09-02 NOTE — Patient Instructions (Signed)
Continue to push fluids, practice good hand hygiene, and cover your mouth if you cough. ? ?If you start having fevers, shaking or shortness of breath, seek immediate care. ? ?OK to take Tylenol 1000 mg (2 extra strength tabs) or 975 mg (3 regular strength tabs) every 6 hours as needed. ? ?Let us know if you need anything. ?

## 2022-09-02 NOTE — Progress Notes (Signed)
Chief Complaint  Patient presents with   Cough    Sxs started on 12/23 and a negative COVID on 12/26. Pt states having productive cough, worse at night, drainage. Pt states using Vicks daytime/nighttime    Lisa Morrison here for URI complaints.  Duration: 5 days  She has a hx of breast cancer s/p radiation. Associated symptoms: sinus congestion, rhinorrhea, ear fullness, shortness of breath, and productive cough Denies: sinus pain, itchy watery eyes, ear pain, ear drainage, sore throat, wheezing, myalgia, and fevers Treatment to date: Ivermectin, Nyquil/Dayquil equivalent Sick contacts: Yes; granddaughter Tested neg for covid yesterday.   Past Medical History:  Diagnosis Date   Anxiety    Arthritis    Breast cancer ([place])    CFS (chronic fatigue syndrome)    Depression    denies now   Dyspnea    Edema    due to taking lyrica   Family history of adverse reaction to anesthesia    son had n/v   Family history of pancreatic cancer 05/03/2022   Fibromyalgia    IBS (irritable bowel syndrome)    Leaky heart valve    Migraine    Orthostatic hypotension    from a  previous surgery    PONV (postoperative nausea and vomiting)    and heart rate goes down " I fainted after surgery when standing in the restroom."   Seasonal allergies    Spondylolisthesis of lumbar region    Wears glasses     Objective BP 130/80 (BP Location: Right Arm, Patient Position: Sitting, Cuff Size: Normal)   Pulse 80   Temp 98.2 F (36.8 C) (Oral)   Resp 18   Ht '5\' 4"'$  (1.626 m)   Wt 184 lb 3.2 oz (83.6 kg)   SpO2 97%   BMI 31.62 kg/m  General: Awake, alert, appears stated age HEENT: AT, [place], ears patent b/l and TM's neg, nares patent w/o discharge, pharynx pink and without exudates, MMM Neck: No masses or asymmetry Heart: RRR Lungs: CTAB, no accessory muscle use Psych: Age appropriate judgment and insight, normal mood and affect  Acute bacterial bronchitis - Plan: doxycycline (VIBRA-TABS) 100 MG  tablet, promethazine-dextromethorphan (PROMETHAZINE-DM) 6.25-15 MG/5ML syrup  7 d of doxy given dyspnea. Continue to push fluids, practice good hand hygiene, cover mouth when coughing. F/u prn. If starting to experience fevers, shaking, or shortness of breath, seek immediate care. Pt voiced understanding and agreement to the plan.  [place], DO 09/02/22 1:29 PM

## 2022-09-03 ENCOUNTER — Ambulatory Visit: Payer: Medicare PPO | Admitting: Rehabilitation

## 2022-09-03 ENCOUNTER — Encounter: Payer: Self-pay | Admitting: Rehabilitation

## 2022-09-03 DIAGNOSIS — R293 Abnormal posture: Secondary | ICD-10-CM | POA: Insufficient documentation

## 2022-09-03 DIAGNOSIS — C50212 Malignant neoplasm of upper-inner quadrant of left female breast: Secondary | ICD-10-CM | POA: Insufficient documentation

## 2022-09-03 DIAGNOSIS — Z170 Estrogen receptor positive status [ER+]: Secondary | ICD-10-CM | POA: Insufficient documentation

## 2022-09-03 NOTE — Therapy (Signed)
  Pt arrived for SOZO screen but did not need to be scheduled for these due to cardiac implanted defibrillator.  Discussed with patient.   Shan Levans, PT

## 2022-09-09 DIAGNOSIS — J3089 Other allergic rhinitis: Secondary | ICD-10-CM | POA: Diagnosis not present

## 2022-09-09 DIAGNOSIS — J301 Allergic rhinitis due to pollen: Secondary | ICD-10-CM | POA: Diagnosis not present

## 2022-09-09 DIAGNOSIS — J3081 Allergic rhinitis due to animal (cat) (dog) hair and dander: Secondary | ICD-10-CM | POA: Diagnosis not present

## 2022-09-16 ENCOUNTER — Other Ambulatory Visit: Payer: Self-pay | Admitting: Family Medicine

## 2022-09-16 NOTE — Telephone Encounter (Signed)
Requesting: Lyrica '150mg'$   Contract: None UDS: None Last Visit: 09/02/22 Next Visit: 10/13/22 Last Refill: 03/23/22 #90 and 1RF   Please Advise

## 2022-09-23 DIAGNOSIS — J3089 Other allergic rhinitis: Secondary | ICD-10-CM | POA: Diagnosis not present

## 2022-09-23 DIAGNOSIS — J3081 Allergic rhinitis due to animal (cat) (dog) hair and dander: Secondary | ICD-10-CM | POA: Diagnosis not present

## 2022-09-23 DIAGNOSIS — J301 Allergic rhinitis due to pollen: Secondary | ICD-10-CM | POA: Diagnosis not present

## 2022-09-25 ENCOUNTER — Encounter: Payer: Self-pay | Admitting: Family

## 2022-09-25 ENCOUNTER — Ambulatory Visit: Payer: Medicare PPO | Admitting: Family

## 2022-09-25 VITALS — BP 122/66 | HR 92 | Temp 98.0°F | Resp 18 | Ht 64.0 in | Wt 185.1 lb

## 2022-09-25 DIAGNOSIS — U071 COVID-19: Secondary | ICD-10-CM | POA: Diagnosis not present

## 2022-09-25 LAB — POCT INFLUENZA A/B
Influenza A, POC: NEGATIVE
Influenza B, POC: NEGATIVE

## 2022-09-25 LAB — POC COVID19 BINAXNOW: SARS Coronavirus 2 Ag: POSITIVE — AB

## 2022-09-25 MED ORDER — MOLNUPIRAVIR EUA 200MG CAPSULE: 4.0000 | ORAL_CAPSULE | Freq: Two times a day (BID) | ORAL | 0 refills | Status: AC

## 2022-09-25 NOTE — Progress Notes (Signed)
Lisa Morrison is a 72 y.o. female with the following history as recorded in EpicCare:  Patient Active Problem List   Diagnosis Date Noted   Family history of pancreatic cancer 05/03/2022   Malignant neoplasm of upper-inner quadrant of left breast in female, estrogen receptor positive ([place]) 04/27/2022   Essential hypertension 06/10/2020   GAD (generalized anxiety disorder) 06/10/2020   Allergies 04/09/2020   Elevated blood-pressure reading, without diagnosis of hypertension 02/15/2020   Body mass index (BMI) 29.0-29.9, adult 11/07/2019   S/P lumbar fusion 03/20/2019   Lumbar facet arthropathy 10/27/2018   Asbestos exposure 09/30/2016   DDD (degenerative disc disease), lumbar 09/30/2016   Hyperglycemia 09/30/2016   Mixed hyperlipidemia 09/30/2016   Fibromyalgia 04/27/2016   Anxiety 04/01/2016   Arthritis of left knee 04/01/2016   Chronic fatigue 04/01/2016   Irritable bowel syndrome with constipation 04/01/2016   Lumbar stenosis 04/01/2016   Recurrent major depressive disorder ([place]) 04/01/2016   Chest pain syndrome 03/31/2016   Palpitations 03/31/2016   Shortness of breath 03/31/2016    Current Outpatient Medications  Medication Sig Dispense Refill   acetaminophen (TYLENOL) 650 MG CR tablet Take 1,300 mg by mouth 2 (two) times daily as needed.     ADVAIR HFA 230-21 MCG/ACT inhaler Inhale 2 puffs into the lungs 2 (two) times daily.     buPROPion (WELLBUTRIN SR) 150 MG 12 hr tablet TAKE 1 TABLET BY MOUTH TWICE A DAY 180 tablet 1   Calcium Carbonate-Vit D-Min (CALTRATE 600+D PLUS MINERALS) 600-800 MG-UNIT TABS Take by mouth.     levocetirizine (XYZAL) 5 MG tablet      meloxicam (MOBIC) 7.5 MG tablet      molnupiravir EUA (LAGEVRIO) 200 mg CAPS capsule Take 4 capsules (800 mg total) by mouth 2 (two) times daily for 5 days. 40 capsule 0   montelukast (SINGULAIR) 10 MG tablet Take by mouth.     nortriptyline (PAMELOR) 10 MG capsule TAKE 1-2 CAPSULES (10-20 MG TOTAL) BY MOUTH AT BEDTIME.  180 capsule 2   potassium chloride (MICRO-K) 10 MEQ CR capsule TAKE 2 CAPSULES BY MOUTH EVERY DAY 180 capsule 1   pregabalin (LYRICA) 150 MG capsule TAKE 1 CAPSULE BY MOUTH EVERY DAY AT NIGHT 90 capsule 1   promethazine-dextromethorphan (PROMETHAZINE-DM) 6.25-15 MG/5ML syrup Take 5 mLs by mouth 4 (four) times daily as needed for cough. 118 mL 0   Prucalopride Succinate (MOTEGRITY) 2 MG TABS      tamoxifen (NOLVADEX) 20 MG tablet Take 1 tablet (20 mg total) by mouth daily. 90 tablet 3   topiramate (TOPAMAX) 50 MG tablet TAKE 1 TABLET BY MOUTH TWICE DAILY. 180 tablet 1   triamterene-hydrochlorothiazide (DYAZIDE) 37.5-25 MG capsule TAKE 1 EACH (1 CAPSULE TOTAL) BY MOUTH DAILY. 90 capsule 1   EPIPEN 2-PAK 0.3 MG/0.3ML SOAJ injection  (Patient not taking: Reported on 09/25/2022)     No current facility-administered medications for this visit.    Allergies: Augmentin [amoxicillin-pot clavulanate], Demerol [meperidine], Amoxicillin, and Clavulanic acid  Past Medical History:  Diagnosis Date   Anxiety    Arthritis    Breast cancer ([place])    CFS (chronic fatigue syndrome)    Depression    denies now   Dyspnea    Edema    due to taking lyrica   Family history of adverse reaction to anesthesia    son had n/v   Family history of pancreatic cancer 05/03/2022   Fibromyalgia    IBS (irritable bowel syndrome)    Leaky heart  valve    Migraine    Orthostatic hypotension    from a  previous surgery    PONV (postoperative nausea and vomiting)    and heart rate goes down " I fainted after surgery when standing in the restroom."   Seasonal allergies    Spondylolisthesis of lumbar region    Wears glasses     Past Surgical History:  Procedure Laterality Date   ABDOMINAL HYSTERECTOMY     back ablasion     03/2018 and 05/2018   back stimulator  2022   BACK SURGERY  2015   BREAST LUMPECTOMY WITH RADIOACTIVE SEED AND SENTINEL LYMPH NODE BIOPSY Left 05/22/2022   Procedure: LEFT BREAST LUMPECTOMY WITH  RADIOACTIVE SEED AND SENTINEL LYMPH NODE BIOPSY;  Surgeon: Jovita Kussmaul, MD;  Location: [facility];  Service: General;  Laterality: Left;   CHOLECYSTECTOMY     HERNIA REPAIR     x2   OOPHORECTOMY     VARICOSE VEIN SURGERY      Family History  Problem Relation Age of Onset   Mitral valve prolapse Mother    Coronary aneurysm Father    Diabetes Father    Pancreatic cancer Maternal Uncle 69   Healthy Son     Social History   Tobacco Use   Smoking status: Never    Passive exposure: Past   Smokeless tobacco: Never  Substance Use Topics   Alcohol use: Never    Subjective:   Cough/ congestion/ fever x 1 day; + headache; husband tested positive for COVID earlier this week; has not taken any home tests;   Objective:  Vitals:   09/25/22 1046  BP: 122/66  Pulse: 92  Resp: 18  Temp: 98 F (36.7 C)  TempSrc: Oral  SpO2: 98%  Weight: 185 lb 2 oz (84 kg)  Height: '5\' 4"'$  (1.626 m)    General: Well developed, well nourished, in no acute distress  Skin : Warm and dry.  Head: Normocephalic and atraumatic  Eyes: Sclera and conjunctiva clear; pupils round and reactive to light; extraocular movements intact  Ears: External normal; canals clear; tympanic membranes normal  Oropharynx: Pink, supple. No suspicious lesions  Neck: Supple without thyromegaly, adenopathy  Lungs: Respirations unlabored; clear to auscultation bilaterally without wheeze, rales, rhonchi  CVS exam: normal rate and regular rhythm.  Neurologic: Alert and oriented; speech intact; face symmetrical; moves all extremities well; CNII-XII intact without focal deficit   Assessment:  1. COVID-19     Plan:  Rapid COVID is positive; Rx for Molnupiravir- take as directed; patient has Promethazine DM at home and can use for symptom relief; increase fluids, rest and follow up worse, no better.   No follow-ups on file.  Orders Placed This Encounter  Procedures   POCT Influenza A/B   POC COVID-19    Order  Specific Question:   Previously tested for COVID-19    Answer:   Yes    Order Specific Question:   Resident in a congregate (group) care setting    Answer:   No    Order Specific Question:   Employed in healthcare setting    Answer:   No    Order Specific Question:   Pregnant    Answer:   No    Requested Prescriptions   Signed Prescriptions Disp Refills   molnupiravir EUA (LAGEVRIO) 200 mg CAPS capsule 40 capsule 0    Sig: Take 4 capsules (800 mg total) by mouth 2 (two) times daily for  5 days.

## 2022-10-07 DIAGNOSIS — J301 Allergic rhinitis due to pollen: Secondary | ICD-10-CM | POA: Diagnosis not present

## 2022-10-07 DIAGNOSIS — J3081 Allergic rhinitis due to animal (cat) (dog) hair and dander: Secondary | ICD-10-CM | POA: Diagnosis not present

## 2022-10-07 DIAGNOSIS — J3089 Other allergic rhinitis: Secondary | ICD-10-CM | POA: Diagnosis not present

## 2022-10-13 ENCOUNTER — Ambulatory Visit: Payer: Medicare PPO | Admitting: Family Medicine

## 2022-10-13 ENCOUNTER — Other Ambulatory Visit: Payer: Self-pay | Admitting: Family Medicine

## 2022-10-13 ENCOUNTER — Encounter: Payer: Self-pay | Admitting: Family Medicine

## 2022-10-13 VITALS — BP 120/80 | HR 78 | Temp 97.8°F | Ht 64.0 in | Wt 184.5 lb

## 2022-10-13 DIAGNOSIS — F411 Generalized anxiety disorder: Secondary | ICD-10-CM | POA: Diagnosis not present

## 2022-10-13 DIAGNOSIS — G43109 Migraine with aura, not intractable, without status migrainosus: Secondary | ICD-10-CM

## 2022-10-13 DIAGNOSIS — I10 Essential (primary) hypertension: Secondary | ICD-10-CM

## 2022-10-13 DIAGNOSIS — G8929 Other chronic pain: Secondary | ICD-10-CM

## 2022-10-13 MED ORDER — LUBIPROSTONE 24 MCG PO CAPS: 24.0000 ug | ORAL_CAPSULE | Freq: Every day | ORAL | 2 refills | Status: DC

## 2022-10-13 NOTE — Progress Notes (Signed)
Chief Complaint  Patient presents with   Follow-up    6 month Still has a cough from [place] is a 72 y.o. female who presents for hypertension follow up. She does not monitor home blood pressures. She is compliant with medications. Patient has these side effects of medication: none She is adhering to a healthy diet overall. Current exercise: walking No CP, SOB improving from COVID.   GAD Doing OK. Taking nortriptyline 10-20 mg/d. Does have some drowsiness. Does not want to change anything. She is not following with a therapist/counseling. No HI or SI. No self medication.    Past Medical History:  Diagnosis Date   Anxiety    Arthritis    Breast cancer ([place])    CFS (chronic fatigue syndrome)    Depression    denies now   Dyspnea    Edema    due to taking lyrica   Family history of adverse reaction to anesthesia    son had n/v   Family history of pancreatic cancer 05/03/2022   Fibromyalgia    IBS (irritable bowel syndrome)    Leaky heart valve    Migraine    Orthostatic hypotension    from a  previous surgery    PONV (postoperative nausea and vomiting)    and heart rate goes down " I fainted after surgery when standing in the restroom."   Seasonal allergies    Spondylolisthesis of lumbar region    Wears glasses     Exam BP 120/80 (BP Location: Right Arm, Patient Position: Sitting, Cuff Size: Normal)   Pulse 78   Temp 97.8 F (36.6 C) (Oral)   Ht '5\' 4"'$  (1.626 m)   Wt 184 lb 8 oz (83.7 kg)   SpO2 97%   BMI 31.67 kg/m  General:  well developed, well nourished, in no apparent distress Heart: RRR, no bruits, no LE edema Lungs: clear to auscultation, no accessory muscle use Psych: well oriented with normal range of affect and appropriate judgment/insight  Essential hypertension  GAD (generalized anxiety disorder)  Chronic, stable. Cont Dyazide 37.5-25 mg/d.  Counseled on diet and exercise. Chronic, stable. Cont nortriptyline 10-20 mg  qhs.  Consider getting a tetanus booster at the pharmacy.  F/u in 6 mo. The patient voiced understanding and agreement to the plan.  [place], DO 10/13/22  9:08 AM

## 2022-10-13 NOTE — Patient Instructions (Signed)
Keep the diet clean and stay active.  Please consider going to the pharmacy to get your updated tetanus booster.  Let us know if you need anything.

## 2022-10-21 ENCOUNTER — Ambulatory Visit: Payer: Medicare PPO | Admitting: Family Medicine

## 2022-10-21 ENCOUNTER — Encounter: Payer: Self-pay | Admitting: Family Medicine

## 2022-10-21 VITALS — BP 120/80 | HR 105 | Temp 97.8°F | Ht 64.0 in | Wt 184.1 lb

## 2022-10-21 DIAGNOSIS — H6592 Unspecified nonsuppurative otitis media, left ear: Secondary | ICD-10-CM

## 2022-10-21 DIAGNOSIS — J3089 Other allergic rhinitis: Secondary | ICD-10-CM | POA: Diagnosis not present

## 2022-10-21 DIAGNOSIS — J301 Allergic rhinitis due to pollen: Secondary | ICD-10-CM | POA: Diagnosis not present

## 2022-10-21 DIAGNOSIS — J3081 Allergic rhinitis due to animal (cat) (dog) hair and dander: Secondary | ICD-10-CM | POA: Diagnosis not present

## 2022-10-21 MED ORDER — PREDNISONE 20 MG PO TABS: 40.0000 mg | ORAL_TABLET | Freq: Every day | ORAL | 0 refills | Status: DC

## 2022-10-21 NOTE — Progress Notes (Signed)
Chief Complaint  Patient presents with   Ear Pain    Left ear- No hearing in her left ear     Pt is here for left ear pain. Duration: 6 days Progression: Worsening Associated symptoms: dizziness, fullness, decreased hearing Denies: recent illness, fevers, bleeding, or discharge from ear Treatment to date: none  Past Medical History:  Diagnosis Date   Anxiety    Arthritis    Breast cancer ([place])    CFS (chronic fatigue syndrome)    Depression    denies now   Dyspnea    Edema    due to taking lyrica   Family history of adverse reaction to anesthesia    son had n/v   Family history of pancreatic cancer 05/03/2022   Fibromyalgia    IBS (irritable bowel syndrome)    Leaky heart valve    Migraine    Orthostatic hypotension    from a  previous surgery    PONV (postoperative nausea and vomiting)    and heart rate goes down " I fainted after surgery when standing in the restroom."   Seasonal allergies    Spondylolisthesis of lumbar region    Wears glasses     BP 120/80 (BP Location: Right Arm, Patient Position: Sitting, Cuff Size: Normal)   Pulse (!) 105   Temp 97.8 F (36.6 C) (Oral)   Ht 5' 4"$  (1.626 m)   Wt 184 lb 2 oz (83.5 kg)   SpO2 97%   BMI 31.60 kg/m  General: Awake, alert, appearing stated age HEENT:  L ear- Canal patent without drainage or erythema, TM is retracted and with serous fluid present; no erythema R ear- canal patent without drainage or erythema, TM is neg Nose- nares patent and without discharge Mouth- Lips, gums and dentition unremarkable, pharynx is without erythema or exudate Neck: No adenopathy Lungs: Normal effort, no accessory muscle use Psych: Age appropriate judgment and insight, normal mood and affect  Middle ear effusion, left - Plan: predniSONE (DELTASONE) 20 MG tablet  5 d pred burst 40 mg/d. If no improvement by next week, will consider ENT eval.  F/u prn.  Pt voiced understanding and agreement to the plan.  [place], DO 10/21/22 10:13 AM

## 2022-10-21 NOTE — Patient Instructions (Signed)
We can consider phentermine (Adipex) for up to 90 days.  Let us know if you need anything.

## 2022-10-22 ENCOUNTER — Telehealth: Payer: Self-pay | Admitting: Hematology and Oncology

## 2022-10-22 NOTE — Telephone Encounter (Signed)
Per 2/15 IB reached out  to patient , went over scheduling time and dates patient confirmed.

## 2022-10-26 ENCOUNTER — Other Ambulatory Visit: Payer: Self-pay | Admitting: Family Medicine

## 2022-10-26 ENCOUNTER — Encounter: Payer: Self-pay | Admitting: Hematology and Oncology

## 2022-10-26 ENCOUNTER — Encounter: Payer: Self-pay | Admitting: Family Medicine

## 2022-10-26 MED ORDER — AZITHROMYCIN 250 MG PO TABS: ORAL_TABLET | ORAL | 0 refills | Status: DC

## 2022-10-27 ENCOUNTER — Telehealth: Payer: Self-pay

## 2022-10-27 NOTE — Telephone Encounter (Signed)
Called pt in regards to MyChart message. We spoke extensively concerning her side effects. She tells me she has been on Prednisone 40 mg daily for a week d/t otitis. She thinks some of these side effects are secondary to Prednisone. Advised pt we typically recommend stopping Tamoxifen and coming in for 2 week f/u. She would like to see if sx are from Prednisone first. Pt states she will contact us in one week if sx have not subsided.

## 2022-11-04 DIAGNOSIS — J3089 Other allergic rhinitis: Secondary | ICD-10-CM | POA: Diagnosis not present

## 2022-11-04 DIAGNOSIS — J301 Allergic rhinitis due to pollen: Secondary | ICD-10-CM | POA: Diagnosis not present

## 2022-11-04 DIAGNOSIS — J3081 Allergic rhinitis due to animal (cat) (dog) hair and dander: Secondary | ICD-10-CM | POA: Diagnosis not present

## 2022-11-05 ENCOUNTER — Ambulatory Visit: Payer: Medicare PPO | Admitting: Podiatry

## 2022-11-05 DIAGNOSIS — M79675 Pain in left toe(s): Secondary | ICD-10-CM

## 2022-11-05 DIAGNOSIS — Q828 Other specified congenital malformations of skin: Secondary | ICD-10-CM | POA: Diagnosis not present

## 2022-11-05 DIAGNOSIS — M79674 Pain in right toe(s): Secondary | ICD-10-CM | POA: Diagnosis not present

## 2022-11-05 DIAGNOSIS — B351 Tinea unguium: Secondary | ICD-10-CM

## 2022-11-05 NOTE — Progress Notes (Signed)
Subjective: Chief Complaint  Patient presents with   Nail Problem    Routine Foot Care    Callouses    Callus to right foot      72 year old female presents the office they for concerns of elongated, thick toenails that are hard for her to trim and for a mild callus on the left foot. She has been on her feet more recently she reports.  Objective: AAO x3, NAD DP/PT pulses palpable bilaterally, CRT less than 3 seconds Nails are hypertrophic, dystrophic, brittle, discolored, elongated 10. No surrounding redness or drainage. Tenderness nails 1-5 bilaterally. No open lesions or pre-ulcerative lesions are identified today. Hyperkeratotic tissue is present left foot medial hallux and left submetatarsal area.  There is no ongoing ulceration drainage or signs of infection.  Calluses seem to be somewhat larger today compared to prior but there is no ulceration. Hammertoes present bilaterally No pain with calf compression, swelling, warmth, erythema No acute changes otherwsie  Assessment: Symptomatic onychomycosis, hyperkeratotic lesion  Plan: -All treatment options discussed with the patient including all alternatives, risks, complications.  -Nails sharply debrided x 10 without complications or bleeding.  -Hyperkeratotic lesions sharply debrided x 2 without any complications or bleeding. Continue with supportive shoes for offloading. -Daily foot inspection  Celesta Gentile, DPM

## 2022-11-09 DIAGNOSIS — H25813 Combined forms of age-related cataract, bilateral: Secondary | ICD-10-CM | POA: Diagnosis not present

## 2022-11-09 DIAGNOSIS — J3089 Other allergic rhinitis: Secondary | ICD-10-CM | POA: Diagnosis not present

## 2022-11-09 DIAGNOSIS — J301 Allergic rhinitis due to pollen: Secondary | ICD-10-CM | POA: Diagnosis not present

## 2022-11-09 DIAGNOSIS — J3081 Allergic rhinitis due to animal (cat) (dog) hair and dander: Secondary | ICD-10-CM | POA: Diagnosis not present

## 2022-11-10 ENCOUNTER — Other Ambulatory Visit (HOSPITAL_COMMUNITY): Payer: Self-pay

## 2022-11-10 ENCOUNTER — Inpatient Hospital Stay: Payer: Medicare PPO | Attending: Adult Health | Admitting: Adult Health

## 2022-11-10 ENCOUNTER — Encounter: Payer: Self-pay | Admitting: Adult Health

## 2022-11-10 VITALS — BP 123/63 | HR 80 | Temp 97.8°F | Resp 14 | Wt 189.4 lb

## 2022-11-10 DIAGNOSIS — Z79810 Long term (current) use of selective estrogen receptor modulators (SERMs): Secondary | ICD-10-CM | POA: Diagnosis not present

## 2022-11-10 DIAGNOSIS — J301 Allergic rhinitis due to pollen: Secondary | ICD-10-CM | POA: Diagnosis not present

## 2022-11-10 DIAGNOSIS — J4540 Moderate persistent asthma, uncomplicated: Secondary | ICD-10-CM | POA: Diagnosis not present

## 2022-11-10 DIAGNOSIS — J3089 Other allergic rhinitis: Secondary | ICD-10-CM | POA: Diagnosis not present

## 2022-11-10 DIAGNOSIS — R5383 Other fatigue: Secondary | ICD-10-CM | POA: Insufficient documentation

## 2022-11-10 DIAGNOSIS — C50212 Malignant neoplasm of upper-inner quadrant of left female breast: Secondary | ICD-10-CM | POA: Diagnosis not present

## 2022-11-10 DIAGNOSIS — Z170 Estrogen receptor positive status [ER+]: Secondary | ICD-10-CM | POA: Diagnosis not present

## 2022-11-10 DIAGNOSIS — R0609 Other forms of dyspnea: Secondary | ICD-10-CM | POA: Diagnosis not present

## 2022-11-10 DIAGNOSIS — J3081 Allergic rhinitis due to animal (cat) (dog) hair and dander: Secondary | ICD-10-CM | POA: Diagnosis not present

## 2022-11-10 NOTE — Progress Notes (Signed)
SURVIVORSHIP VISIT:   BRIEF ONCOLOGIC HISTORY:  Oncology History  Malignant neoplasm of upper-inner quadrant of left breast in female, estrogen receptor positive ([place])  04/08/2022 Mammogram   Screening mammogram shows distortion in the left breast warranting further evaluation.  No findings suspicious for malignancy in the right breast.  Left breast diagnostic mammogram confirmed a 6 mm mass in the 11 o'clock position of the left breast with imaging features highly suspicious for malignancy.  Ultrasound of the left axilla demonstrated normal-appearing left axillary lymph nodes   04/27/2022 Initial Diagnosis   Malignant neoplasm of upper-inner quadrant of left breast in female, estrogen receptor positive [facility])    Pathology Results   Pathology from the left breast biopsy showed low-grade invasive well-differentiated ductal adenocarcinoma along with low-grade DCIS showing ER 100% positive strong staining PR 100% positive strong staining, Ki-67 of less than 1% and HER2 negative 1+ by [facility]   05/22/2022 Pathology Results   She had left breast lumpectomy which showed 0.7 cm grade 1 invasive ductal carcinoma, low-grade DCIS, all margins negative for invasive carcinoma, all lymph nodes negative for metastatic carcinoma.   05/2022 -  Anti-estrogen oral therapy   Tamoxifen   06/24/2022 - 07/21/2022 Radiation Therapy   Site Technique Total Dose (Gy) Dose per Fx (Gy) Completed Fx Beam Energies  Breast, Left: Breast_L 3D 40.05/40.05 2.67 15/15 6XFFF, 10XFFF  Breast, Left: Breast_L_Bst specialPort 10/10 2 5/5 12E       INTERVAL HISTORY:  Ms. Bowes to review her survivorship care plan detailing her treatment course for breast cancer, as well as monitoring long-term side effects of that treatment, education regarding health maintenance, screening, and overall wellness and health promotion.     Overall, Ms. Ings reports feeling quite well.  She is taking Tamoxifen daily.  She has some hot flashes and  increased pimples on her back.  She does not want to take the aromatase inhibitors.    She has shortness of breath with exercise which is related to her asthma.  She is fatigued, however this is related to her sleep schedule and lifestyle, not related to sleep quality.    REVIEW OF SYSTEMS:  Review of Systems  Constitutional:  Negative for appetite change, chills, fatigue, fever and unexpected weight change.  HENT:   Negative for hearing loss, lump/mass and trouble swallowing.   Eyes:  Negative for eye problems and icterus.  Respiratory:  Negative for chest tightness, cough and shortness of breath.   Cardiovascular:  Negative for chest pain, leg swelling and palpitations.  Gastrointestinal:  Negative for abdominal distention, abdominal pain, constipation, diarrhea, nausea and vomiting.  Endocrine: Negative for hot flashes.  Genitourinary:  Negative for difficulty urinating.   Musculoskeletal:  Negative for arthralgias.  Skin:  Negative for itching and rash.  Neurological:  Negative for dizziness, extremity weakness, headaches and numbness.  Hematological:  Negative for adenopathy. Does not bruise/bleed easily.  Psychiatric/Behavioral:  Negative for depression. The patient is not nervous/anxious.   Breast: Denies any new nodularity, masses, tenderness, nipple changes, or nipple discharge.   PAST MEDICAL/SURGICAL HISTORY:  Past Medical History:  Diagnosis Date   Anxiety    Arthritis    Breast cancer ([place])    CFS (chronic fatigue syndrome)    Depression    denies now   Dyspnea    Edema    due to taking lyrica   Family history of adverse reaction to anesthesia    son had n/v   Family history of pancreatic cancer 05/03/2022  Fibromyalgia    IBS (irritable bowel syndrome)    Leaky heart valve    Migraine    Orthostatic hypotension    from a  previous surgery    PONV (postoperative nausea and vomiting)    and heart rate goes down " I fainted after surgery when standing in the  restroom."   Seasonal allergies    Spondylolisthesis of lumbar region    Wears glasses    Past Surgical History:  Procedure Laterality Date   ABDOMINAL HYSTERECTOMY     back ablasion     03/2018 and 05/2018   back stimulator  2022   BACK SURGERY  2015   BREAST LUMPECTOMY WITH RADIOACTIVE SEED AND SENTINEL LYMPH NODE BIOPSY Left 05/22/2022   Procedure: LEFT BREAST LUMPECTOMY WITH RADIOACTIVE SEED AND SENTINEL LYMPH NODE BIOPSY;  Surgeon: Jovita Kussmaul, MD;  Location: [facility];  Service: General;  Laterality: Left;   CHOLECYSTECTOMY     HERNIA REPAIR     x2   OOPHORECTOMY     VARICOSE VEIN SURGERY       ALLERGIES:  Allergies  Allergen Reactions   Augmentin [Amoxicillin-Pot Clavulanate] Rash    Did it involve swelling of the face/tongue/throat, SOB, or low BP? Yes Did it involve sudden or severe rash/hives, skin peeling, or any reaction on the inside of your mouth or nose? Yes Did you need to seek medical attention at a hospital or doctor's office? Yes When did it last happen? Over 10 years ago If all above answers are "NO", may proceed with cephalosporin use.   Demerol [Meperidine] Other (See Comments)    Lowers blood pressure    Amoxicillin     Other reaction(s): Loss of consciousness   Clavulanic Acid     Other reaction(s): lowers blood pressure     CURRENT MEDICATIONS:  Outpatient Encounter Medications as of 11/10/2022  Medication Sig Note   acetaminophen (TYLENOL) 650 MG CR tablet Take 1,300 mg by mouth 2 (two) times daily as needed.    ADVAIR HFA 230-21 MCG/ACT inhaler Inhale 2 puffs into the lungs 2 (two) times daily.    azithromycin (ZITHROMAX) 250 MG tablet Take 2 tabs the first day and then 1 tab daily until you run out.    buPROPion (WELLBUTRIN SR) 150 MG 12 hr tablet TAKE 1 TABLET BY MOUTH TWICE A DAY    Calcium Carbonate-Vit D-Min (CALTRATE 600+D PLUS MINERALS) 600-800 MG-UNIT TABS Take by mouth.    EPIPEN 2-PAK 0.3 MG/0.3ML SOAJ injection   09/25/2022: PRN   levocetirizine (XYZAL) 5 MG tablet     lubiprostone (AMITIZA) 24 MCG capsule Take 1 capsule (24 mcg total) by mouth daily with breakfast.    meloxicam (MOBIC) 7.5 MG tablet     montelukast (SINGULAIR) 10 MG tablet Take by mouth.    nortriptyline (PAMELOR) 10 MG capsule TAKE 1-2 CAPSULES (10-20 MG TOTAL) BY MOUTH AT BEDTIME.    potassium chloride (MICRO-K) 10 MEQ CR capsule TAKE 2 CAPSULES BY MOUTH EVERY DAY    pregabalin (LYRICA) 150 MG capsule TAKE 1 CAPSULE BY MOUTH EVERY DAY AT NIGHT    tamoxifen (NOLVADEX) 20 MG tablet Take 1 tablet (20 mg total) by mouth daily.    topiramate (TOPAMAX) 50 MG tablet TAKE 1 TABLET BY MOUTH TWICE A DAY    triamterene-hydrochlorothiazide (DYAZIDE) 37.5-25 MG capsule TAKE 1 EACH (1 CAPSULE TOTAL) BY MOUTH DAILY.    No facility-administered encounter medications on file as of 11/10/2022.  ONCOLOGIC FAMILY HISTORY:  Family History  Problem Relation Age of Onset   Mitral valve prolapse Mother    Coronary aneurysm Father    Diabetes Father    Pancreatic cancer Maternal Uncle 47   Healthy Son       SOCIAL HISTORY:  Social History   Socioeconomic History   Marital status: Married    Spouse name: Not on file   Number of children: Not on file   Years of education: Not on file   Highest education level: Not on file  Occupational History   Not on file  Tobacco Use   Smoking status: Never    Passive exposure: Past   Smokeless tobacco: Never  Vaping Use   Vaping Use: Never used  Substance and Sexual Activity   Alcohol use: Never   Drug use: Never   Sexual activity: Not Currently  Other Topics Concern   Not on file  Social History Narrative   Not on file   Social Determinants of Health   Financial Resource Strain: Low Risk  (05/29/2021)   Overall Financial Resource Strain (CARDIA)    Difficulty of Paying Living Expenses: Not hard at all  Food Insecurity: No Food Insecurity (05/29/2021)   Hunger Vital Sign    Worried  About Running Out of Food in the Last Year: Never true    [place] in the Last Year: Never true  Transportation Needs: No Transportation Needs (05/29/2021)   PRAPARE - Hydrologist (Medical): No    Lack of Transportation (Non-Medical): No  Physical Activity: Inactive (05/29/2021)   Exercise Vital Sign    Days of Exercise per Week: 0 days    Minutes of Exercise per Session: 0 min  Stress: No Stress Concern Present (05/29/2021)   [place]    Feeling of Stress : Not at all  Social Connections: Moderately Integrated (05/29/2021)   Social Connection and Isolation Panel [NHANES]    Frequency of Communication with Friends and Family: More than three times a week    Frequency of Social Gatherings with Friends and Family: More than three times a week    Attends Religious Services: Never    Marine scientist or Organizations: Yes    Attends Archivist Meetings: 1 to 4 times per year    Marital Status: Married  Human resources officer Violence: Not At Risk (05/29/2021)   Humiliation, Afraid, Rape, and Kick questionnaire    Fear of Current or Ex-Partner: No    Emotionally Abused: No    Physically Abused: No    Sexually Abused: No     OBSERVATIONS/OBJECTIVE:  BP 123/63 (BP Location: Right Arm, Patient Position: Sitting)   Pulse 80   Temp 97.8 F (36.6 C) (Oral)   Resp 14   Wt 189 lb 6.4 oz (85.9 kg)   SpO2 100%   BMI 32.51 kg/m  GENERAL: Patient is a well appearing female in no acute distress HEENT:  Sclerae anicteric.  Oropharynx clear and moist. No ulcerations or evidence of oropharyngeal candidiasis. Neck is supple.  NODES:  No cervical, supraclavicular, or axillary lymphadenopathy palpated.  BREAST EXAM: Left breast status postlumpectomy and radiation no sign of local recurrence right breast is benign. LUNGS:  Clear to auscultation bilaterally.  No wheezes or rhonchi. HEART:   Regular rate and rhythm. No murmur appreciated. ABDOMEN:  Soft, nontender.  Positive, normoactive bowel sounds. No organomegaly palpated. MSK:  No  focal spinal tenderness to palpation. Full range of motion bilaterally in the upper extremities. EXTREMITIES:  No peripheral edema.   SKIN:  Clear with no obvious rashes or skin changes. No nail dyscrasia. NEURO:  Nonfocal. Well oriented.  Appropriate affect.  LABORATORY DATA:  None for this visit.  DIAGNOSTIC IMAGING:  None for this visit.      ASSESSMENT AND PLAN:  Ms.. Roughton is a pleasant 72 y.o. female with Stage 1A left breast invasive ductal carcinoma, ER+/PR+/HER2-, diagnosed in August 2023, treated with lumpectomy, adjuvant radiation therapy, and anti-estrogen therapy with tamoxifen beginning in 05/2022.  She presents to the Survivorship Clinic for our initial meeting and routine follow-up post-completion of treatment for breast cancer.    1. Stage 1A left breast cancer:  Ms. Clowdus is continuing to recover from definitive treatment for breast cancer. She will follow-up with her medical oncologist, Dr. Chryl Heck with history and physical exam per surveillance protocol.  She will continue her anti-estrogen therapy with Tamoxifen. Thus far, she is tolerating the Tamoxifen well, with minimal side effects. Her mammogram is due 04/2023; orders placed today. Today, a comprehensive survivorship care plan and treatment summary was reviewed with the patient today detailing her breast cancer diagnosis, treatment course, potential late/long-term effects of treatment, appropriate follow-up care with recommendations for the future, and patient education resources.  A copy of this summary, along with a letter will be sent to the patient's primary care provider via mail/fax/In Basket message after today's visit.    2. Hot flashes: We discussed nonpharmacologic interventions such as changing the time of day that she takes tamoxifen.  We also discussed moisture wicking  sheets and pajamas.  3. Bone health. Her last DEXA scan was 05/01/2021 which demonstrated osteopenia with a T-score of -1.2 in the left forearm.  She is recommended to undergo repeat testing every 2 years based on the diagnosis of osteopenia and can follow-up with her primary care provider for this testing as tamoxifen has a protective effect on the bones.  She was given education on specific activities to promote bone health.  4. Cancer screening:  Due to Ms. Wilusz history and her age, she should receive screening for skin cancers, colon cancer.  The information and recommendations are listed on the patient's comprehensive care plan/treatment summary and were reviewed in detail with the patient.    5. Health maintenance and wellness promotion: Ms. Bloemer was encouraged to consume 5-7 servings of fruits and vegetables per day. We reviewed the "Nutrition Rainbow" handout.  She was also encouraged to engage in moderate to vigorous exercise for 30 minutes per day most days of the week.  She was instructed to limit her alcohol consumption and continue to abstain from tobacco use.     6. Support services/counseling: It is not uncommon for this period of the patient's cancer care trajectory to be one of many emotions and stressors.  She was given information regarding our available services and encouraged to contact me with any questions or for help enrolling in any of our support group/programs.    Follow up instructions:    -Return to cancer center in 6 months for f/u with Dr. Chryl Heck  -Mammogram due in 04/2023 -She is welcome to return back to the Survivorship Clinic at any time; no additional follow-up needed at this time.  -Consider referral back to survivorship as a long-term survivor for continued surveillance  The patient was provided an opportunity to ask questions and all were answered. The patient agreed with  the plan and demonstrated an understanding of the instructions.   Total encounter  time:40 minutes*in face-to-face visit time, chart review, lab review, care coordination, order entry, and documentation of the encounter time.    Wilber Bihari, NP 11/10/22 9:05 AM Medical Oncology and Hematology [facility] [place], [place] [address] Tel. [phone]    Fax. [phone]  *Total Encounter Time as defined by the Centers for Medicare and Medicaid Services includes, in addition to the face-to-face time of a patient visit (documented in the note above) non-face-to-face time: obtaining and reviewing outside history, ordering and reviewing medications, tests or procedures, care coordination (communications with other health care professionals or caregivers) and documentation in the medical record.

## 2022-11-10 NOTE — Addendum Note (Signed)
Addended by: Rea [employer] D on: 11/10/2022 03:27 PM   Modules accepted: Orders

## 2022-11-11 ENCOUNTER — Telehealth: Payer: Self-pay | Admitting: Adult Health

## 2022-11-11 NOTE — Telephone Encounter (Signed)
Scheduled appointment per 3/5 los. Left voicemail.

## 2022-11-18 DIAGNOSIS — J301 Allergic rhinitis due to pollen: Secondary | ICD-10-CM | POA: Diagnosis not present

## 2022-11-18 DIAGNOSIS — J3089 Other allergic rhinitis: Secondary | ICD-10-CM | POA: Diagnosis not present

## 2022-11-18 DIAGNOSIS — J3081 Allergic rhinitis due to animal (cat) (dog) hair and dander: Secondary | ICD-10-CM | POA: Diagnosis not present

## 2022-11-19 ENCOUNTER — Other Ambulatory Visit: Payer: Self-pay | Admitting: Family Medicine

## 2022-11-24 ENCOUNTER — Encounter: Payer: Self-pay | Admitting: Podiatry

## 2022-12-02 DIAGNOSIS — J3081 Allergic rhinitis due to animal (cat) (dog) hair and dander: Secondary | ICD-10-CM | POA: Diagnosis not present

## 2022-12-02 DIAGNOSIS — J301 Allergic rhinitis due to pollen: Secondary | ICD-10-CM | POA: Diagnosis not present

## 2022-12-02 DIAGNOSIS — J3089 Other allergic rhinitis: Secondary | ICD-10-CM | POA: Diagnosis not present

## 2022-12-07 DIAGNOSIS — H5052 Exophoria: Secondary | ICD-10-CM | POA: Diagnosis not present

## 2022-12-07 DIAGNOSIS — H02831 Dermatochalasis of right upper eyelid: Secondary | ICD-10-CM | POA: Diagnosis not present

## 2022-12-07 DIAGNOSIS — H25813 Combined forms of age-related cataract, bilateral: Secondary | ICD-10-CM | POA: Diagnosis not present

## 2022-12-07 DIAGNOSIS — H40013 Open angle with borderline findings, low risk, bilateral: Secondary | ICD-10-CM | POA: Diagnosis not present

## 2022-12-07 DIAGNOSIS — H35373 Puckering of macula, bilateral: Secondary | ICD-10-CM | POA: Diagnosis not present

## 2022-12-11 ENCOUNTER — Other Ambulatory Visit: Payer: Self-pay | Admitting: Family Medicine

## 2022-12-16 DIAGNOSIS — C50212 Malignant neoplasm of upper-inner quadrant of left female breast: Secondary | ICD-10-CM | POA: Diagnosis not present

## 2022-12-16 DIAGNOSIS — Z170 Estrogen receptor positive status [ER+]: Secondary | ICD-10-CM | POA: Diagnosis not present

## 2022-12-17 DIAGNOSIS — J301 Allergic rhinitis due to pollen: Secondary | ICD-10-CM | POA: Diagnosis not present

## 2022-12-17 DIAGNOSIS — J3081 Allergic rhinitis due to animal (cat) (dog) hair and dander: Secondary | ICD-10-CM | POA: Diagnosis not present

## 2022-12-17 DIAGNOSIS — J3089 Other allergic rhinitis: Secondary | ICD-10-CM | POA: Diagnosis not present

## 2022-12-29 DIAGNOSIS — H02834 Dermatochalasis of left upper eyelid: Secondary | ICD-10-CM | POA: Diagnosis not present

## 2022-12-29 DIAGNOSIS — H40013 Open angle with borderline findings, low risk, bilateral: Secondary | ICD-10-CM | POA: Diagnosis not present

## 2022-12-29 DIAGNOSIS — H02831 Dermatochalasis of right upper eyelid: Secondary | ICD-10-CM | POA: Diagnosis not present

## 2022-12-29 DIAGNOSIS — H5052 Exophoria: Secondary | ICD-10-CM | POA: Diagnosis not present

## 2022-12-29 DIAGNOSIS — H25813 Combined forms of age-related cataract, bilateral: Secondary | ICD-10-CM | POA: Diagnosis not present

## 2022-12-29 DIAGNOSIS — H524 Presbyopia: Secondary | ICD-10-CM | POA: Diagnosis not present

## 2022-12-29 DIAGNOSIS — H35373 Puckering of macula, bilateral: Secondary | ICD-10-CM | POA: Diagnosis not present

## 2022-12-30 DIAGNOSIS — J3081 Allergic rhinitis due to animal (cat) (dog) hair and dander: Secondary | ICD-10-CM | POA: Diagnosis not present

## 2022-12-30 DIAGNOSIS — J301 Allergic rhinitis due to pollen: Secondary | ICD-10-CM | POA: Diagnosis not present

## 2022-12-30 DIAGNOSIS — J3089 Other allergic rhinitis: Secondary | ICD-10-CM | POA: Diagnosis not present

## 2023-01-13 DIAGNOSIS — J3089 Other allergic rhinitis: Secondary | ICD-10-CM | POA: Diagnosis not present

## 2023-01-13 DIAGNOSIS — J301 Allergic rhinitis due to pollen: Secondary | ICD-10-CM | POA: Diagnosis not present

## 2023-01-13 DIAGNOSIS — J3081 Allergic rhinitis due to animal (cat) (dog) hair and dander: Secondary | ICD-10-CM | POA: Diagnosis not present

## 2023-01-15 ENCOUNTER — Encounter: Payer: Self-pay | Admitting: Family Medicine

## 2023-01-20 ENCOUNTER — Encounter: Payer: Self-pay | Admitting: Family Medicine

## 2023-01-20 ENCOUNTER — Ambulatory Visit: Payer: Medicare PPO | Admitting: Family Medicine

## 2023-01-20 VITALS — BP 124/72 | HR 81 | Temp 97.8°F | Ht 64.0 in | Wt 187.1 lb

## 2023-01-20 DIAGNOSIS — E669 Obesity, unspecified: Secondary | ICD-10-CM

## 2023-01-20 MED ORDER — PHENTERMINE HCL 37.5 MG PO CAPS: 37.5000 mg | ORAL_CAPSULE | ORAL | 0 refills | Status: DC

## 2023-01-20 NOTE — Patient Instructions (Addendum)
Keep the diet clean and stay active.  Use this time to form new good habits.   Flonase (fluticasone); nasal spray that is over the counter. 2 sprays each nostril, once daily. Aim towards the same side eye when you spray.  Let us know if you need anything.

## 2023-01-20 NOTE — Progress Notes (Signed)
Chief Complaint  Patient presents with   Weight Loss    Subjective: Patient is a 72 y.o. female here for discussion for weight loss options.  Interested in losing weight. Started Weight Watchers last week. Diet is starting to improve. Active around house. Has never been on a medication before. Interested in doing so, injectables were not covered by insurance.   Past Medical History:  Diagnosis Date   Anxiety    Arthritis    Breast cancer ([facility])    CFS (chronic fatigue syndrome)    Depression    denies now   Dyspnea    Edema    due to taking lyrica   Family history of adverse reaction to anesthesia    son had n/v   Family history of pancreatic cancer 05/03/2022   Fibromyalgia    IBS (irritable bowel syndrome)    Leaky heart valve    Migraine    Orthostatic hypotension    from a  previous surgery    PONV (postoperative nausea and vomiting)    and heart rate goes down " I fainted after surgery when standing in the restroom."   Seasonal allergies    Spondylolisthesis of lumbar region    Wears glasses     Objective: BP 124/72 (BP Location: Right Arm, Patient Position: Sitting, Cuff Size: Normal)   Pulse 81   Temp 97.8 F (36.6 C) (Oral)   Ht 5\' 4"  (1.626 m)   Wt 187 lb 2 oz (84.9 kg)   SpO2 98%   BMI 32.12 kg/m  General: Awake, appears stated age Heart: RRR, no LE edema Lungs: CTAB, no rales, wheezes or rhonchi. No accessory muscle use Psych: Age appropriate judgment and insight, normal affect and mood  Assessment and Plan: Obesity (BMI 30-39.9) - Plan: phentermine 37.5 MG capsule, DISCONTINUED: phentermine 37.5 MG capsule  Chronic, uncontrolled. Injectables not covered. Counseled on diet/exercise. Will do a 30 d trial of phentermine and see her next mo. If doing well, will do another 60 d and then stop.  The patient voiced understanding and agreement to the plan.  Lisa Morrison [place], DO 01/20/23  10:54 AM

## 2023-01-27 DIAGNOSIS — J3081 Allergic rhinitis due to animal (cat) (dog) hair and dander: Secondary | ICD-10-CM | POA: Diagnosis not present

## 2023-01-27 DIAGNOSIS — J301 Allergic rhinitis due to pollen: Secondary | ICD-10-CM | POA: Diagnosis not present

## 2023-01-27 DIAGNOSIS — J3089 Other allergic rhinitis: Secondary | ICD-10-CM | POA: Diagnosis not present

## 2023-02-04 ENCOUNTER — Ambulatory Visit: Payer: Medicare PPO | Admitting: Podiatry

## 2023-02-05 ENCOUNTER — Other Ambulatory Visit: Payer: Self-pay | Admitting: Family Medicine

## 2023-02-05 DIAGNOSIS — F411 Generalized anxiety disorder: Secondary | ICD-10-CM

## 2023-02-05 DIAGNOSIS — M797 Fibromyalgia: Secondary | ICD-10-CM

## 2023-02-09 ENCOUNTER — Ambulatory Visit: Payer: Medicare PPO | Admitting: Rheumatology

## 2023-02-09 DIAGNOSIS — J3089 Other allergic rhinitis: Secondary | ICD-10-CM | POA: Diagnosis not present

## 2023-02-09 DIAGNOSIS — J301 Allergic rhinitis due to pollen: Secondary | ICD-10-CM | POA: Diagnosis not present

## 2023-02-09 DIAGNOSIS — J3081 Allergic rhinitis due to animal (cat) (dog) hair and dander: Secondary | ICD-10-CM | POA: Diagnosis not present

## 2023-02-10 DIAGNOSIS — H5052 Exophoria: Secondary | ICD-10-CM | POA: Diagnosis not present

## 2023-02-10 DIAGNOSIS — Z961 Presence of intraocular lens: Secondary | ICD-10-CM | POA: Diagnosis not present

## 2023-02-10 DIAGNOSIS — H25811 Combined forms of age-related cataract, right eye: Secondary | ICD-10-CM | POA: Diagnosis not present

## 2023-02-10 DIAGNOSIS — H02834 Dermatochalasis of left upper eyelid: Secondary | ICD-10-CM | POA: Diagnosis not present

## 2023-02-10 DIAGNOSIS — H25813 Combined forms of age-related cataract, bilateral: Secondary | ICD-10-CM | POA: Diagnosis not present

## 2023-02-10 DIAGNOSIS — J45909 Unspecified asthma, uncomplicated: Secondary | ICD-10-CM | POA: Diagnosis not present

## 2023-02-10 DIAGNOSIS — E669 Obesity, unspecified: Secondary | ICD-10-CM | POA: Diagnosis not present

## 2023-02-10 DIAGNOSIS — F419 Anxiety disorder, unspecified: Secondary | ICD-10-CM | POA: Diagnosis not present

## 2023-02-10 DIAGNOSIS — H25812 Combined forms of age-related cataract, left eye: Secondary | ICD-10-CM | POA: Diagnosis not present

## 2023-02-10 DIAGNOSIS — H35373 Puckering of macula, bilateral: Secondary | ICD-10-CM | POA: Diagnosis not present

## 2023-02-10 DIAGNOSIS — H524 Presbyopia: Secondary | ICD-10-CM | POA: Diagnosis not present

## 2023-02-10 DIAGNOSIS — H02831 Dermatochalasis of right upper eyelid: Secondary | ICD-10-CM | POA: Diagnosis not present

## 2023-02-10 DIAGNOSIS — H40013 Open angle with borderline findings, low risk, bilateral: Secondary | ICD-10-CM | POA: Diagnosis not present

## 2023-02-17 DIAGNOSIS — Z880 Allergy status to penicillin: Secondary | ICD-10-CM | POA: Diagnosis not present

## 2023-02-17 DIAGNOSIS — H524 Presbyopia: Secondary | ICD-10-CM | POA: Diagnosis not present

## 2023-02-17 DIAGNOSIS — H40013 Open angle with borderline findings, low risk, bilateral: Secondary | ICD-10-CM | POA: Diagnosis not present

## 2023-02-17 DIAGNOSIS — J45909 Unspecified asthma, uncomplicated: Secondary | ICD-10-CM | POA: Diagnosis not present

## 2023-02-17 DIAGNOSIS — H25812 Combined forms of age-related cataract, left eye: Secondary | ICD-10-CM | POA: Diagnosis not present

## 2023-02-17 DIAGNOSIS — E669 Obesity, unspecified: Secondary | ICD-10-CM | POA: Diagnosis not present

## 2023-02-17 DIAGNOSIS — Z961 Presence of intraocular lens: Secondary | ICD-10-CM | POA: Diagnosis not present

## 2023-02-17 DIAGNOSIS — Z6830 Body mass index (BMI) 30.0-30.9, adult: Secondary | ICD-10-CM | POA: Diagnosis not present

## 2023-02-22 ENCOUNTER — Encounter: Payer: Self-pay | Admitting: Family Medicine

## 2023-02-22 ENCOUNTER — Ambulatory Visit: Payer: Medicare PPO | Admitting: Family Medicine

## 2023-02-22 VITALS — BP 131/80 | HR 56 | Temp 98.3°F | Ht 64.0 in | Wt 176.4 lb

## 2023-02-22 DIAGNOSIS — E669 Obesity, unspecified: Secondary | ICD-10-CM | POA: Diagnosis not present

## 2023-02-22 DIAGNOSIS — Z6830 Body mass index (BMI) 30.0-30.9, adult: Secondary | ICD-10-CM

## 2023-02-22 MED ORDER — PHENTERMINE HCL 37.5 MG PO CAPS: 37.5000 mg | ORAL_CAPSULE | ORAL | 0 refills | Status: DC

## 2023-02-22 NOTE — Patient Instructions (Signed)
Keep the diet clean and stay active.  Strong work with your weight loss.   Let us know if you need anything.  

## 2023-02-22 NOTE — Progress Notes (Signed)
Chief Complaint  Patient presents with   Follow-up    1 month     Subjective: Patient is a 72 y.o. female here for f/u.  Patient was started on phentermine 37.5 mg daily 1 month ago.  Reports compliance and no adverse effects.  Diet is fair.  No specific exercise, but is active in garden and with her granddaughter.  She has lost around 10 pounds since then. She joined [employer] as well.   Past Medical History:  Diagnosis Date   Anxiety    Arthritis    Breast cancer ([facility])    CFS (chronic fatigue syndrome)    Depression    denies now   Dyspnea    Edema    due to taking lyrica   Family history of adverse reaction to anesthesia    son had n/v   Family history of pancreatic cancer 05/03/2022   Fibromyalgia    IBS (irritable bowel syndrome)    Leaky heart valve    Migraine    Orthostatic hypotension    from a  previous surgery    PONV (postoperative nausea and vomiting)    and heart rate goes down " I fainted after surgery when standing in the restroom."   Seasonal allergies    Spondylolisthesis of lumbar region    Wears glasses     Objective: BP 131/80 (BP Location: Right Arm, Patient Position: Sitting, Cuff Size: Normal)   Pulse (!) 56   Temp 98.3 F (36.8 C) (Oral)   Ht 5\' 4"  (1.626 m)   Wt 176 lb 6 oz (80 kg)   SpO2 97%   BMI 30.27 kg/m  General: Awake, appears stated age Heart: RRR, no LE edema Lungs: CTAB, no rales, wheezes or rhonchi. No accessory muscle use Psych: Age appropriate judgment and insight, normal affect and mood  Assessment and Plan: Obesity (BMI 30-39.9)  Chronic, improving.  Will continue 60 more days of phentermine.  Counseled on diet and exercise. Fu as originally scheduled. The patient voiced understanding and agreement to the plan.  Jilda Roche [place], DO 02/22/23  9:26 AM

## 2023-03-03 DIAGNOSIS — J3089 Other allergic rhinitis: Secondary | ICD-10-CM | POA: Diagnosis not present

## 2023-03-03 DIAGNOSIS — J3081 Allergic rhinitis due to animal (cat) (dog) hair and dander: Secondary | ICD-10-CM | POA: Diagnosis not present

## 2023-03-03 DIAGNOSIS — J301 Allergic rhinitis due to pollen: Secondary | ICD-10-CM | POA: Diagnosis not present

## 2023-03-10 DIAGNOSIS — J301 Allergic rhinitis due to pollen: Secondary | ICD-10-CM | POA: Diagnosis not present

## 2023-03-10 DIAGNOSIS — J3089 Other allergic rhinitis: Secondary | ICD-10-CM | POA: Diagnosis not present

## 2023-03-10 DIAGNOSIS — J3081 Allergic rhinitis due to animal (cat) (dog) hair and dander: Secondary | ICD-10-CM | POA: Diagnosis not present

## 2023-03-18 ENCOUNTER — Other Ambulatory Visit: Payer: Self-pay | Admitting: Family Medicine

## 2023-03-18 NOTE — Telephone Encounter (Signed)
Requesting: Lyrica 150mg   Contract: None UDS: None Last Visit: 02/22/23 Next Visit: 04/20/23 Last Refill: 09/16/22 #90 and 1RF   Please Advise

## 2023-03-27 ENCOUNTER — Other Ambulatory Visit: Payer: Self-pay | Admitting: Family Medicine

## 2023-03-30 DIAGNOSIS — J3089 Other allergic rhinitis: Secondary | ICD-10-CM | POA: Diagnosis not present

## 2023-03-30 DIAGNOSIS — J3081 Allergic rhinitis due to animal (cat) (dog) hair and dander: Secondary | ICD-10-CM | POA: Diagnosis not present

## 2023-03-30 DIAGNOSIS — J301 Allergic rhinitis due to pollen: Secondary | ICD-10-CM | POA: Diagnosis not present

## 2023-04-02 DIAGNOSIS — J3089 Other allergic rhinitis: Secondary | ICD-10-CM | POA: Diagnosis not present

## 2023-04-02 DIAGNOSIS — J3081 Allergic rhinitis due to animal (cat) (dog) hair and dander: Secondary | ICD-10-CM | POA: Diagnosis not present

## 2023-04-02 DIAGNOSIS — J301 Allergic rhinitis due to pollen: Secondary | ICD-10-CM | POA: Diagnosis not present

## 2023-04-03 ENCOUNTER — Other Ambulatory Visit: Payer: Self-pay | Admitting: Family Medicine

## 2023-04-03 DIAGNOSIS — R519 Headache, unspecified: Secondary | ICD-10-CM

## 2023-04-03 DIAGNOSIS — G43109 Migraine with aura, not intractable, without status migrainosus: Secondary | ICD-10-CM

## 2023-04-07 ENCOUNTER — Encounter (INDEPENDENT_AMBULATORY_CARE_PROVIDER_SITE_OTHER): Payer: Self-pay

## 2023-04-14 DIAGNOSIS — J3081 Allergic rhinitis due to animal (cat) (dog) hair and dander: Secondary | ICD-10-CM | POA: Diagnosis not present

## 2023-04-14 DIAGNOSIS — J301 Allergic rhinitis due to pollen: Secondary | ICD-10-CM | POA: Diagnosis not present

## 2023-04-14 DIAGNOSIS — J3089 Other allergic rhinitis: Secondary | ICD-10-CM | POA: Diagnosis not present

## 2023-04-20 ENCOUNTER — Other Ambulatory Visit: Payer: Self-pay | Admitting: Family Medicine

## 2023-04-20 ENCOUNTER — Ambulatory Visit (INDEPENDENT_AMBULATORY_CARE_PROVIDER_SITE_OTHER): Payer: Medicare PPO | Admitting: Family Medicine

## 2023-04-20 ENCOUNTER — Encounter: Payer: Self-pay | Admitting: Family Medicine

## 2023-04-20 ENCOUNTER — Ambulatory Visit (INDEPENDENT_AMBULATORY_CARE_PROVIDER_SITE_OTHER): Payer: Medicare PPO

## 2023-04-20 VITALS — BP 120/70 | HR 85 | Temp 98.5°F | Ht 64.0 in | Wt 172.2 lb

## 2023-04-20 DIAGNOSIS — I10 Essential (primary) hypertension: Secondary | ICD-10-CM

## 2023-04-20 DIAGNOSIS — Z0000 Encounter for general adult medical examination without abnormal findings: Secondary | ICD-10-CM

## 2023-04-20 DIAGNOSIS — E876 Hypokalemia: Secondary | ICD-10-CM

## 2023-04-20 LAB — LIPID PANEL
Cholesterol: 187 mg/dL (ref 0–200)
HDL: 56.9 mg/dL (ref >39.00)
LDL Cholesterol: 104 mg/dL — ABNORMAL HIGH (ref 0–99)
NonHDL: 130.04
Total CHOL/HDL Ratio: 3
Triglycerides: 129 mg/dL (ref 0.0–149.0)
VLDL: 25.8 mg/dL (ref 0.0–40.0)

## 2023-04-20 LAB — COMPREHENSIVE METABOLIC PANEL
ALT: 11 U/L (ref 0–35)
AST: 13 U/L (ref 0–37)
Albumin: 4.1 g/dL (ref 3.5–5.2)
Alkaline Phosphatase: 63 U/L (ref 39–117)
BUN: 23 mg/dL (ref 6–23)
CO2: 30 mEq/L (ref 19–32)
Calcium: 9.2 mg/dL (ref 8.4–10.5)
Chloride: 100 mEq/L (ref 96–112)
Creatinine, Ser: 0.76 mg/dL (ref 0.40–1.20)
GFR: 79.2 mL/min (ref >60.00)
Glucose, Bld: 98 mg/dL (ref 70–99)
Potassium: 3.3 mEq/L — ABNORMAL LOW (ref 3.5–5.1)
Sodium: 136 mEq/L (ref 135–145)
Total Bilirubin: 0.5 mg/dL (ref 0.2–1.2)
Total Protein: 6.7 g/dL (ref 6.0–8.3)

## 2023-04-20 LAB — CBC
HCT: 42.9 % (ref 36.0–46.0)
Hemoglobin: 13.9 g/dL (ref 12.0–15.0)
MCHC: 32.5 g/dL (ref 30.0–36.0)
MCV: 94.9 fl (ref 78.0–100.0)
Platelets: 236 10*3/uL (ref 150.0–400.0)
RBC: 4.52 Mil/uL (ref 3.87–5.11)
RDW: 13.3 % (ref 11.5–15.5)
WBC: 4.7 10*3/uL (ref 4.0–10.5)

## 2023-04-20 LAB — MAGNESIUM: Magnesium: 2.1 mg/dL (ref 1.5–2.5)

## 2023-04-20 MED ORDER — BREZTRI AEROSPHERE 160-9-4.8 MCG/ACT IN AERO: 2.0000 | INHALATION_SPRAY | Freq: Two times a day (BID) | RESPIRATORY_TRACT | 5 refills | Status: AC

## 2023-04-20 NOTE — Progress Notes (Signed)
Chief Complaint  Patient presents with   Annual Exam     Well Woman Lisa Morrison is here for a complete physical.   Her last physical was >1 year ago.  Current diet: in general, a "healthy" diet. Current exercise: limited. Weight is intentionally down and she denies daytime fatigue. Seatbelt? Yes Advanced directive? Yes  Health Maintenance Colonoscopy- Yes Shingrix- Yes DEXA- Yes Mammogram- Yes- hx of breast cancer Tetanus- Due Pneumonia- Yes Hep C screen- Yes  Past Medical History:  Diagnosis Date   Anxiety    Arthritis    Breast cancer (HCC)    CFS (chronic fatigue syndrome)    Depression    denies now   Dyspnea    Edema    due to taking lyrica   Family history of adverse reaction to anesthesia    son had n/v   Family history of pancreatic cancer 05/03/2022   Fibromyalgia    IBS (irritable bowel syndrome)    Leaky heart valve    Migraine    Orthostatic hypotension    from a  previous surgery    PONV (postoperative nausea and vomiting)    and heart rate goes down " I fainted after surgery when standing in the restroom."   Seasonal allergies    Spondylolisthesis of lumbar region    Wears glasses      Past Surgical History:  Procedure Laterality Date   ABDOMINAL HYSTERECTOMY     back ablasion     03/2018 and 05/2018   back stimulator  2022   BACK SURGERY  2015   BREAST LUMPECTOMY WITH RADIOACTIVE SEED AND SENTINEL LYMPH NODE BIOPSY Left 05/22/2022   Procedure: LEFT BREAST LUMPECTOMY WITH RADIOACTIVE SEED AND SENTINEL LYMPH NODE BIOPSY;  Surgeon: Griselda Miner, MD;  Location: [facility];  Service: General;  Laterality: Left;   CHOLECYSTECTOMY     HERNIA REPAIR     x2   OOPHORECTOMY     VARICOSE VEIN SURGERY      Medications  Current Outpatient Medications on File Prior to Visit  Medication Sig Dispense Refill   acetaminophen (TYLENOL) 650 MG CR tablet Take 1,300 mg by mouth 2 (two) times daily as needed.     buPROPion (WELLBUTRIN SR)  150 MG 12 hr tablet Take 1 tablet (150 mg total) by mouth 2 (two) times daily. 180 tablet 1   Calcium Carbonate-Vit D-Min (CALTRATE 600+D PLUS MINERALS) 600-800 MG-UNIT TABS Take by mouth.     EPIPEN 2-PAK 0.3 MG/0.3ML SOAJ injection      levocetirizine (XYZAL) 5 MG tablet      lubiprostone (AMITIZA) 24 MCG capsule Take 1 capsule (24 mcg total) by mouth daily with breakfast. 90 capsule 2   meloxicam (MOBIC) 7.5 MG tablet      montelukast (SINGULAIR) 10 MG tablet TAKE 1 TABLET BY MOUTH EVERY DAY IN THE EVENING 90 tablet 2   nortriptyline (PAMELOR) 10 MG capsule TAKE 1-2 CAPSULES (10-20 MG TOTAL) BY MOUTH AT BEDTIME. 180 capsule 2   potassium chloride (MICRO-K) 10 MEQ CR capsule TAKE 2 CAPSULES BY MOUTH EVERY DAY 180 capsule 1   pregabalin (LYRICA) 150 MG capsule TAKE 1 CAPSULE BY MOUTH EVERY NIGHT 90 capsule 1   tamoxifen (NOLVADEX) 20 MG tablet Take 1 tablet (20 mg total) by mouth daily. 90 tablet 3   topiramate (TOPAMAX) 50 MG tablet TAKE 1 TABLET BY MOUTH EVERY DAY AT NIGHT 90 tablet 1   triamterene-hydrochlorothiazide (DYAZIDE) 37.5-25 MG capsule Take 1  each (1 capsule total) by mouth daily. 90 capsule 1    Allergies Allergies  Allergen Reactions   Augmentin [Amoxicillin-Pot Clavulanate] Rash    Did it involve swelling of the face/tongue/throat, SOB, or low BP? Yes Did it involve sudden or severe rash/hives, skin peeling, or any reaction on the inside of your mouth or nose? Yes Did you need to seek medical attention at a hospital or doctor's office? Yes When did it last happen? Over 10 years ago If all above answers are "NO", may proceed with cephalosporin use.   Demerol [Meperidine] Other (See Comments)    Lowers blood pressure    Amoxicillin     Other reaction(s): Loss of consciousness   Clavulanic Acid     Other reaction(s): lowers blood pressure    Review of Systems: Constitutional:  no fevers Eye:  no recent significant change in vision Ears:  No changes in  hearing Nose/Mouth/Throat:  no complaints of nasal congestion, no sore throat Cardiovascular: no chest pain Respiratory:  No shortness of breath Gastrointestinal:  No change in bowel habits GU:  Female: negative for dysuria Integumentary:  no abnormal skin lesions reported Neurologic:  no headaches Endocrine:  denies unexplained weight changes  Exam BP 120/70 (BP Location: Right Arm, Patient Position: Sitting, Cuff Size: Normal)   Pulse 85   Temp 98.5 F (36.9 C) (Oral)   Ht 5\' 4"  (1.626 m)   Wt 172 lb 4 oz (78.1 kg)   SpO2 97%   BMI 29.57 kg/m  General:  well developed, well nourished, in no apparent distress Skin:  no significant moles, warts, or growths Head:  no masses, lesions, or tenderness Eyes:  pupils equal and round, sclera anicteric without injection Ears:  canals without lesions, TMs shiny without retraction, no obvious effusion, no erythema Nose:  nares patent, mucosa normal, and no drainage Throat/Pharynx:  lips and gingiva without lesion; tongue and uvula midline; non-inflamed pharynx; no exudates or postnasal drainage Neck: neck supple without adenopathy, thyromegaly, or masses Lungs:  clear to auscultation, breath sounds equal bilaterally, no respiratory distress Cardio:  regular rate and rhythm, no bruits or LE edema Abdomen:  abdomen soft, nontender; bowel sounds normal; no masses or organomegaly Genital: Deferred Neuro:  gait normal; deep tendon reflexes normal and symmetric MSK: No TTP over the lumbar paraspinal musculature or midline. Psych: well oriented with normal range of affect and appropriate judgment/insight  Assessment and Plan  Well adult exam  Essential hypertension - Plan: CBC, Comprehensive metabolic panel, Lipid panel   Well 72 y.o. female. Counseled on diet and exercise. Advanced directive form requested today.  Other orders as above. Having some back/sciatic pain which she has had in the past.  Stretches and exercises provided in her  paperwork.  If no improvement next 3 to 4 weeks, she will send a message and we will set her up with physical therapy. Follow up in 6 mo. The patient voiced understanding and agreement to the plan.  Jilda Roche [place], DO 04/20/23 9:02 AM

## 2023-04-20 NOTE — Patient Instructions (Addendum)
Give [place] 2-3 business days to get the results of your labs back.   Keep the diet clean and stay active. Try to get around 65 g of protein in daily.   I recommend getting the flu shot in mid October. This suggestion would change if the CDC comes out with a different recommendation.   Please get me a copy of your advanced directive form at your convenience.   Please get your tetanus booster at the pharmacy.   Heat (pad or rice pillow in microwave) over affected area, 10-15 minutes twice daily.   Ice/cold pack over area for 10-15 min twice daily.  OK to take Tylenol 1000 mg (2 extra strength tabs) or 975 mg (3 regular strength tabs) every 6 hours as needed.  Let me know if you want to set up with physical therapy if no better in the next 3-4 weeks.   Let us know if you need anything.  EXERCISES  RANGE OF MOTION (ROM) AND STRETCHING EXERCISES - Low Back Pain Most people with lower back pain will find that their symptoms get worse with excessive bending forward (flexion) or arching at the lower back (extension). The exercises that will help resolve your symptoms will focus on the opposite motion.  If you have pain, numbness or tingling which travels down into your buttocks, leg or foot, the goal of the therapy is for these symptoms to move closer to your back and eventually resolve. Sometimes, these leg symptoms will get better, but your lower back pain may worsen. This is often an indication of progress in your rehabilitation. Be very alert to any changes in your symptoms and the activities in which you participated in the 24 hours prior to the change. Sharing this information with your caregiver will allow him or her to most efficiently treat your condition. These exercises may help you when beginning to rehabilitate your injury. Your symptoms may resolve with or without further involvement from your physician, physical therapist or athletic trainer. While completing these exercises, remember:   Restoring tissue flexibility helps normal motion to return to the joints. This allows healthier, less painful movement and activity. An effective stretch should be held for at least 30 seconds. A stretch should never be painful. You should only feel a gentle lengthening or release in the stretched tissue. FLEXION RANGE OF MOTION AND STRETCHING EXERCISES:  STRETCH - Flexion, Single Knee to Chest  Lie on a firm bed or floor with both legs extended in front of you. Keeping one leg in contact with the floor, bring your opposite knee to your chest. Hold your leg in place by either grabbing behind your thigh or at your knee. Pull until you feel a gentle stretch in your low back. Hold 30 seconds. Slowly release your grasp and repeat the exercise with the opposite side. Repeat 2 times. Complete this exercise 3 times per week.   STRETCH - Flexion, Double Knee to Chest Lie on a firm bed or floor with both legs extended in front of you. Keeping one leg in contact with the floor, bring your opposite knee to your chest. Tense your stomach muscles to support your back and then lift your other knee to your chest. Hold your legs in place by either grabbing behind your thighs or at your knees. Pull both knees toward your chest until you feel a gentle stretch in your low back. Hold 30 seconds. Tense your stomach muscles and slowly return one leg at a time to the floor.  Repeat 2 times. Complete this exercise 3 times per week.   STRETCH - Low Trunk Rotation Lie on a firm bed or floor. Keeping your legs in front of you, bend your knees so they are both pointed toward the ceiling and your feet are flat on the floor. Extend your arms out to the side. This will stabilize your upper body by keeping your shoulders in contact with the floor. Gently and slowly drop both knees together to one side until you feel a gentle stretch in your low back. Hold for 30 seconds. Tense your stomach muscles to support your lower  back as you bring your knees back to the starting position. Repeat the exercise to the other side. Repeat 2 times. Complete this exercise at least 3 times per week.   EXTENSION RANGE OF MOTION AND FLEXIBILITY EXERCISES:  STRETCH - Extension, Prone on Elbows  Lie on your stomach on the floor, a bed will be too soft. Place your palms about shoulder width apart and at the height of your head. Place your elbows under your shoulders. If this is too painful, stack pillows under your chest. Allow your body to relax so that your hips drop lower and make contact more completely with the floor. Hold this position for 30 seconds. Slowly return to lying flat on the floor. Repeat 2 times. Complete this exercise 3 times per week.   RANGE OF MOTION - Extension, Prone Press Ups Lie on your stomach on the floor, a bed will be too soft. Place your palms about shoulder width apart and at the height of your head. Keeping your back as relaxed as possible, slowly straighten your elbows while keeping your hips on the floor. You may adjust the placement of your hands to maximize your comfort. As you gain motion, your hands will come more underneath your shoulders. Hold this position 30 seconds. Slowly return to lying flat on the floor. Repeat 2 times. Complete this exercise 3 times per week.   RANGE OF MOTION- Quadruped, Neutral Spine  Assume a hands and knees position on a firm surface. Keep your hands under your shoulders and your knees under your hips. You may place padding under your knees for comfort. Drop your head and point your tailbone toward the ground below you. This will round out your lower back like an angry cat. Hold this position for 30 seconds. Slowly lift your head and release your tail bone so that your back sags into a large arch, like an old horse. Hold this position for 30 seconds. Repeat this until you feel limber in your low back. Now, find your "sweet spot." This will be the most  comfortable position somewhere between the two previous positions. This is your neutral spine. Once you have found this position, tense your stomach muscles to support your low back. Hold this position for 30 seconds. Repeat 2 times. Complete this exercise 3 times per week.   STRENGTHENING EXERCISES - Low Back Sprain These exercises may help you when beginning to rehabilitate your injury. These exercises should be done near your "sweet spot." This is the neutral, low-back arch, somewhere between fully rounded and fully arched, that is your least painful position. When performed in this safe range of motion, these exercises can be used for people who have either a flexion or extension based injury. These exercises may resolve your symptoms with or without further involvement from your physician, physical therapist or athletic trainer. While completing these exercises, remember:  Muscles can  gain both the endurance and the strength needed for everyday activities through controlled exercises. Complete these exercises as instructed by your physician, physical therapist or athletic trainer. Increase the resistance and repetitions only as guided. You may experience muscle soreness or fatigue, but the pain or discomfort you are trying to eliminate should never worsen during these exercises. If this pain does worsen, stop and make certain you are following the directions exactly. If the pain is still present after adjustments, discontinue the exercise until you can discuss the trouble with your caregiver.  STRENGTHENING - Deep Abdominals, Pelvic Tilt  Lie on a firm bed or floor. Keeping your legs in front of you, bend your knees so they are both pointed toward the ceiling and your feet are flat on the floor. Tense your lower abdominal muscles to press your low back into the floor. This motion will rotate your pelvis so that your tail bone is scooping upwards rather than pointing at your feet or into the  floor. With a gentle tension and even breathing, hold this position for 3 seconds. Repeat 2 times. Complete this exercise 3 times per week.   STRENGTHENING - Abdominals, Crunches  Lie on a firm bed or floor. Keeping your legs in front of you, bend your knees so they are both pointed toward the ceiling and your feet are flat on the floor. Cross your arms over your chest. Slightly tip your chin down without bending your neck. Tense your abdominals and slowly lift your trunk high enough to just clear your shoulder blades. Lifting higher can put excessive stress on the lower back and does not further strengthen your abdominal muscles. Control your return to the starting position. Repeat 2 times. Complete this exercise 3 times per week.   STRENGTHENING - Quadruped, Opposite UE/LE Lift  Assume a hands and knees position on a firm surface. Keep your hands under your shoulders and your knees under your hips. You may place padding under your knees for comfort. Find your neutral spine and gently tense your abdominal muscles so that you can maintain this position. Your shoulders and hips should form a rectangle that is parallel with the floor and is not twisted. Keeping your trunk steady, lift your right hand no higher than your shoulder and then your left leg no higher than your hip. Make sure you are not holding your breath. Hold this position for 30 seconds. Continuing to keep your abdominal muscles tense and your back steady, slowly return to your starting position. Repeat with the opposite arm and leg. Repeat 2 times. Complete this exercise 3 times per week.   STRENGTHENING - Abdominals and Quadriceps, Straight Leg Raise  Lie on a firm bed or floor with both legs extended in front of you. Keeping one leg in contact with the floor, bend the other knee so that your foot can rest flat on the floor. Find your neutral spine, and tense your abdominal muscles to maintain your spinal position throughout the  exercise. Slowly lift your straight leg off the floor about 6 inches for a count of 3, making sure to not hold your breath. Still keeping your neutral spine, slowly lower your leg all the way to the floor. Repeat this exercise with each leg 2 times. Complete this exercise 3 times per week.  POSTURE AND BODY MECHANICS CONSIDERATIONS - Low Back Sprain Keeping correct posture when sitting, standing or completing your activities will reduce the stress put on different body tissues, allowing injured tissues a chance  to heal and limiting painful experiences. The following are general guidelines for improved posture.  While reading these guidelines, remember: The exercises prescribed by your provider will help you have the flexibility and strength to maintain correct postures. The correct posture provides the best environment for your joints to work. All of your joints have less wear and tear when properly supported by a spine with good posture. This means you will experience a healthier, less painful body. Correct posture must be practiced with all of your activities, especially prolonged sitting and standing. Correct posture is as important when doing repetitive low-stress activities (typing) as it is when doing a single heavy-load activity (lifting).  RESTING POSITIONS Consider which positions are most painful for you when choosing a resting position. If you have pain with flexion-based activities (sitting, bending, stooping, squatting), choose a position that allows you to rest in a less flexed posture. You would want to avoid curling into a fetal position on your side. If your pain worsens with extension-based activities (prolonged standing, working overhead), avoid resting in an extended position such as sleeping on your stomach. Most people will find more comfort when they rest with their spine in a more neutral position, neither too rounded nor too arched. Lying on a non-sagging bed on your side with a  pillow between your knees, or on your back with a pillow under your knees will often provide some relief. Keep in mind, being in any one position for a prolonged period of time, no matter how correct your posture, can still lead to stiffness.  PROPER SITTING POSTURE In order to minimize stress and discomfort on your spine, you must sit with correct posture. Sitting with good posture should be effortless for a healthy body. Returning to good posture is a gradual process. Many people can work toward this most comfortably by using various supports until they have the flexibility and strength to maintain this posture on their own. When sitting with proper posture, your ears will fall over your shoulders and your shoulders will fall over your hips. You should use the back of the chair to support your upper back. Your lower back will be in a neutral position, just slightly arched. You may place a small pillow or folded towel at the base of your lower back for  support.  When working at a desk, create an environment that supports good, upright posture. Without extra support, muscles tire, which leads to excessive strain on joints and other tissues. Keep these recommendations in mind:  CHAIR: A chair should be able to slide under your desk when your back makes contact with the back of the chair. This allows you to work closely. The chair's height should allow your eyes to be level with the upper part of your monitor and your hands to be slightly lower than your elbows.  BODY POSITION Your feet should make contact with the floor. If this is not possible, use a foot rest. Keep your ears over your shoulders. This will reduce stress on your neck and low back.  INCORRECT SITTING POSTURES  If you are feeling tired and unable to assume a healthy sitting posture, do not slouch or slump. This puts excessive strain on your back tissues, causing more damage and pain. Healthier options include: Using more support,  like a lumbar pillow. Switching tasks to something that requires you to be upright or walking. Talking a brief walk. Lying down to rest in a neutral-spine position.  PROLONGED STANDING WHILE SLIGHTLY LEANING  FORWARD  When completing a task that requires you to lean forward while standing in one place for a long time, place either foot up on a stationary 2-4 inch high object to help maintain the best posture. When both feet are on the ground, the lower back tends to lose its slight inward curve. If this curve flattens (or becomes too large), then the back and your other joints will experience too much stress, tire more quickly, and can cause pain.  CORRECT STANDING POSTURES Proper standing posture should be assumed with all daily activities, even if they only take a few moments, like when brushing your teeth. As in sitting, your ears should fall over your shoulders and your shoulders should fall over your hips. You should keep a slight tension in your abdominal muscles to brace your spine. Your tailbone should point down to the ground, not behind your body, resulting in an over-extended swayback posture.   INCORRECT STANDING POSTURES  Common incorrect standing postures include a forward head, locked knees and/or an excessive swayback. WALKING Walk with an upright posture. Your ears, shoulders and hips should all line-up.  PROLONGED ACTIVITY IN A FLEXED POSITION When completing a task that requires you to bend forward at your waist or lean over a low surface, try to find a way to stabilize 3 out of 4 of your limbs. You can place a hand or elbow on your thigh or rest a knee on the surface you are reaching across. This will provide you more stability, so that your muscles do not tire as quickly. By keeping your knees relaxed, or slightly bent, you will also reduce stress across your lower back. CORRECT LIFTING TECHNIQUES  DO : Assume a wide stance. This will provide you more stability and the  opportunity to get as close as possible to the object which you are lifting. Tense your abdominals to brace your spine. Bend at the knees and hips. Keeping your back locked in a neutral-spine position, lift using your leg muscles. Lift with your legs, keeping your back straight. Test the weight of unknown objects before attempting to lift them. Try to keep your elbows locked down at your sides in order get the best strength from your shoulders when carrying an object.   Always ask for help when lifting heavy or awkward objects. INCORRECT LIFTING TECHNIQUES DO NOT:  Lock your knees when lifting, even if it is a small object. Bend and twist. Pivot at your feet or move your feet when needing to change directions. Assume that you can safely pick up even a paperclip without proper posture.

## 2023-04-28 ENCOUNTER — Other Ambulatory Visit (INDEPENDENT_AMBULATORY_CARE_PROVIDER_SITE_OTHER): Payer: Medicare PPO

## 2023-04-28 DIAGNOSIS — J3081 Allergic rhinitis due to animal (cat) (dog) hair and dander: Secondary | ICD-10-CM | POA: Diagnosis not present

## 2023-04-28 DIAGNOSIS — E876 Hypokalemia: Secondary | ICD-10-CM | POA: Diagnosis not present

## 2023-04-28 DIAGNOSIS — J3089 Other allergic rhinitis: Secondary | ICD-10-CM | POA: Diagnosis not present

## 2023-04-28 DIAGNOSIS — J301 Allergic rhinitis due to pollen: Secondary | ICD-10-CM | POA: Diagnosis not present

## 2023-04-28 LAB — BASIC METABOLIC PANEL
BUN: 23 mg/dL (ref 6–23)
CO2: 28 mEq/L (ref 19–32)
Calcium: 8.8 mg/dL (ref 8.4–10.5)
Chloride: 104 mEq/L (ref 96–112)
Creatinine, Ser: 0.82 mg/dL (ref 0.40–1.20)
GFR: 72.29 mL/min (ref >60.00)
Glucose, Bld: 100 mg/dL — ABNORMAL HIGH (ref 70–99)
Potassium: 3.8 mEq/L (ref 3.5–5.1)
Sodium: 140 mEq/L (ref 135–145)

## 2023-05-03 ENCOUNTER — Ambulatory Visit
Admission: RE | Admit: 2023-05-03 | Discharge: 2023-05-03 | Disposition: A | Discharge status: Home / Self Care | Payer: Medicare PPO | Source: Ambulatory Visit | Attending: Adult Health | Admitting: Adult Health

## 2023-05-03 DIAGNOSIS — I10 Essential (primary) hypertension: Secondary | ICD-10-CM | POA: Diagnosis not present

## 2023-05-03 DIAGNOSIS — M797 Fibromyalgia: Secondary | ICD-10-CM | POA: Diagnosis not present

## 2023-05-03 DIAGNOSIS — Z170 Estrogen receptor positive status [ER+]: Secondary | ICD-10-CM

## 2023-05-03 DIAGNOSIS — C50212 Malignant neoplasm of upper-inner quadrant of left female breast: Secondary | ICD-10-CM | POA: Diagnosis not present

## 2023-05-03 DIAGNOSIS — E782 Mixed hyperlipidemia: Secondary | ICD-10-CM | POA: Diagnosis not present

## 2023-05-13 ENCOUNTER — Inpatient Hospital Stay: Payer: Medicare PPO | Attending: Hematology and Oncology | Admitting: Hematology and Oncology

## 2023-05-13 VITALS — BP 127/52 | HR 83 | Temp 97.8°F | Resp 16 | Wt 176.3 lb

## 2023-05-13 DIAGNOSIS — Z79810 Long term (current) use of selective estrogen receptor modulators (SERMs): Secondary | ICD-10-CM | POA: Insufficient documentation

## 2023-05-13 DIAGNOSIS — Z923 Personal history of irradiation: Secondary | ICD-10-CM | POA: Diagnosis not present

## 2023-05-13 DIAGNOSIS — Z170 Estrogen receptor positive status [ER+]: Secondary | ICD-10-CM | POA: Diagnosis not present

## 2023-05-13 DIAGNOSIS — Z800 Family history of malignant neoplasm of digestive organs: Secondary | ICD-10-CM | POA: Diagnosis not present

## 2023-05-13 DIAGNOSIS — C50212 Malignant neoplasm of upper-inner quadrant of left female breast: Secondary | ICD-10-CM | POA: Insufficient documentation

## 2023-05-13 NOTE — Progress Notes (Signed)
FOLLOW UP NOTE   BRIEF ONCOLOGIC HISTORY:  Oncology History  Malignant neoplasm of upper-inner quadrant of left breast in female, estrogen receptor positive (HCC)  04/08/2022 Mammogram   Screening mammogram shows distortion in the left breast warranting further evaluation.  No findings suspicious for malignancy in the right breast.  Left breast diagnostic mammogram confirmed a 6 mm mass in the 11 o'clock position of the left breast with imaging features highly suspicious for malignancy.  Ultrasound of the left axilla demonstrated normal-appearing left axillary lymph nodes    Pathology Results   Pathology from the left breast biopsy showed low-grade invasive well-differentiated ductal adenocarcinoma along with low-grade DCIS showing ER 100% positive strong staining PR 100% positive strong staining, Ki-67 of less than 1% and HER2 negative 1+ by [facility]   05/22/2022 Surgery   She had left breast lumpectomy which showed 0.7 cm grade 1 invasive ductal carcinoma, low-grade DCIS, all margins negative for invasive carcinoma, all lymph nodes negative for metastatic carcinoma.     05/2022 -  Anti-estrogen oral therapy   Tamoxifen   06/24/2022 - 07/21/2022 Radiation Therapy   Site Technique Total Dose (Gy) Dose per Fx (Gy) Completed Fx Beam Energies  Breast, Left: Breast_L 3D 40.05/40.05 2.67 15/15 6XFFF, 10XFFF  Breast, Left: Breast_L_Bst specialPort 10/10 2 5/5 12E       INTERVAL HISTORY:   Lisa Morrison  is here for follow up.  Since her last visit here, she has been doing really well on tamoxifen.  She says initially she had some hot flashes but these have subsided and she is doing well.  She has lost about 15 pounds and she is quite pleased by this.  She has noticed some increased bruising of the forearms and wonders if blood tamoxifen is thinning her blood.  She otherwise had a mammogram recently and got a good report.  She is very pleased with the care she has received at [facility] and is very grateful. She  will follow-up with her surgeon in spring.  Rest of the pertinent 10 point ROS reviewed and negative  REVIEW OF SYSTEMS:  Review of Systems  Constitutional:  Negative for appetite change, chills, fatigue, fever and unexpected weight change.  HENT:   Negative for hearing loss, lump/mass and trouble swallowing.   Eyes:  Negative for eye problems and icterus.  Respiratory:  Negative for chest tightness, cough and shortness of breath.   Cardiovascular:  Negative for chest pain, leg swelling and palpitations.  Gastrointestinal:  Negative for abdominal distention, abdominal pain, constipation, diarrhea, nausea and vomiting.  Endocrine: Negative for hot flashes.  Genitourinary:  Negative for difficulty urinating.   Musculoskeletal:  Negative for arthralgias.  Skin:  Negative for itching and rash.  Neurological:  Negative for dizziness, extremity weakness, headaches and numbness.  Hematological:  Negative for adenopathy. Does not bruise/bleed easily.  Psychiatric/Behavioral:  Negative for depression. The patient is not nervous/anxious.   Breast: Denies any new nodularity, masses, tenderness, nipple changes, or nipple discharge.   PAST MEDICAL/SURGICAL HISTORY:  Past Medical History:  Diagnosis Date   Anxiety    Arthritis    Breast cancer (HCC)    CFS (chronic fatigue syndrome)    Depression    denies now   Dyspnea    Edema    due to taking lyrica   Family history of adverse reaction to anesthesia    son had n/v   Family history of pancreatic cancer 05/03/2022   Fibromyalgia    IBS (irritable bowel syndrome)  Leaky heart valve    Migraine    Orthostatic hypotension    from a  previous surgery    PONV (postoperative nausea and vomiting)    and heart rate goes down " I fainted after surgery when standing in the restroom."   Seasonal allergies    Spondylolisthesis of lumbar region    Wears glasses    Past Surgical History:  Procedure Laterality Date   ABDOMINAL HYSTERECTOMY      back ablasion     03/2018 and 05/2018   back stimulator  2022   BACK SURGERY  2015   BREAST LUMPECTOMY WITH RADIOACTIVE SEED AND SENTINEL LYMPH NODE BIOPSY Left 05/22/2022   Procedure: LEFT BREAST LUMPECTOMY WITH RADIOACTIVE SEED AND SENTINEL LYMPH NODE BIOPSY;  Surgeon: Griselda Miner, MD;  Location: [facility];  Service: General;  Laterality: Left;   CHOLECYSTECTOMY     HERNIA REPAIR     x2   OOPHORECTOMY     VARICOSE VEIN SURGERY       ALLERGIES:  Allergies  Allergen Reactions   Augmentin [Amoxicillin-Pot Clavulanate] Rash    Did it involve swelling of the face/tongue/throat, SOB, or low BP? Yes Did it involve sudden or severe rash/hives, skin peeling, or any reaction on the inside of your mouth or nose? Yes Did you need to seek medical attention at a hospital or doctor's office? Yes When did it last happen? Over 10 years ago If all above answers are "NO", may proceed with cephalosporin use.   Demerol [Meperidine] Other (See Comments)    Lowers blood pressure    Amoxicillin     Other reaction(s): Loss of consciousness   Clavulanic Acid     Other reaction(s): lowers blood pressure     CURRENT MEDICATIONS:  Outpatient Encounter Medications as of 05/13/2023  Medication Sig Note   acetaminophen (TYLENOL) 650 MG CR tablet Take 1,300 mg by mouth 2 (two) times daily as needed.    Budeson-Glycopyrrol-Formoterol (BREZTRI AEROSPHERE) 160-9-4.8 MCG/ACT AERO Inhale 2 puffs into the lungs in the morning and at bedtime.    buPROPion (WELLBUTRIN SR) 150 MG 12 hr tablet Take 1 tablet (150 mg total) by mouth 2 (two) times daily.    Calcium Carbonate-Vit D-Min (CALTRATE 600+D PLUS MINERALS) 600-800 MG-UNIT TABS Take by mouth.    EPIPEN 2-PAK 0.3 MG/0.3ML SOAJ injection  09/25/2022: PRN   levocetirizine (XYZAL) 5 MG tablet     lubiprostone (AMITIZA) 24 MCG capsule Take 1 capsule (24 mcg total) by mouth daily with breakfast.    meloxicam (MOBIC) 7.5 MG tablet     montelukast  (SINGULAIR) 10 MG tablet TAKE 1 TABLET BY MOUTH EVERY DAY IN THE EVENING    nortriptyline (PAMELOR) 10 MG capsule TAKE 1-2 CAPSULES (10-20 MG TOTAL) BY MOUTH AT BEDTIME.    potassium chloride (MICRO-K) 10 MEQ CR capsule TAKE 2 CAPSULES BY MOUTH EVERY DAY    pregabalin (LYRICA) 150 MG capsule TAKE 1 CAPSULE BY MOUTH EVERY NIGHT    tamoxifen (NOLVADEX) 20 MG tablet Take 1 tablet (20 mg total) by mouth daily.    topiramate (TOPAMAX) 50 MG tablet TAKE 1 TABLET BY MOUTH EVERY DAY AT NIGHT    triamterene-hydrochlorothiazide (DYAZIDE) 37.5-25 MG capsule Take 1 each (1 capsule total) by mouth daily.    No facility-administered encounter medications on file as of 05/13/2023.     ONCOLOGIC FAMILY HISTORY:  Family History  Problem Relation Age of Onset   Mitral valve prolapse Mother  Coronary aneurysm Father    Diabetes Father    Pancreatic cancer Maternal Uncle 50   Healthy Son       SOCIAL HISTORY:  Social History   Socioeconomic History   Marital status: Married    Spouse name: Not on file   Number of children: Not on file   Years of education: Not on file   Highest education level: Associate degree: academic program  Occupational History   Not on file  Tobacco Use   Smoking status: Never    Passive exposure: Past   Smokeless tobacco: Never  Vaping Use   Vaping status: Never Used  Substance and Sexual Activity   Alcohol use: Never   Drug use: Never   Sexual activity: Not Currently  Other Topics Concern   Not on file  Social History Narrative   Not on file   Social Determinants of Health   Financial Resource Strain: Low Risk  (01/18/2023)   Overall Financial Resource Strain (CARDIA)    Difficulty of Paying Living Expenses: Not hard at all  Food Insecurity: No Food Insecurity (01/18/2023)   Hunger Vital Sign    Worried About Running Out of Food in the Last Year: Never true    Ran Out of Food in the Last Year: Never true  Transportation Needs: No Transportation Needs  (01/18/2023)   PRAPARE - Administrator, Civil Service (Medical): No    Lack of Transportation (Non-Medical): No  Physical Activity: Sufficiently Active (01/18/2023)   Exercise Vital Sign    Days of Exercise per Week: 5 days    Minutes of Exercise per Session: 30 min  Stress: No Stress Concern Present (01/18/2023)   [employer] of Occupational Health - Occupational Stress Questionnaire    Feeling of Stress : Only a little  Social Connections: Socially Integrated (01/18/2023)   Social Connection and Isolation Panel [NHANES]    Frequency of Communication with Friends and Family: More than three times a week    Frequency of Social Gatherings with Friends and Family: More than three times a week    Attends Religious Services: 1 to 4 times per year    Active Member of [employer] or Organizations: Yes    Attends Engineer, structural: More than 4 times per year    Marital Status: Married  Catering manager Violence: Unknown (01/05/2023)   Received from [employer], [facility]   HITS    Physically Hurt: Not on file    Insult or Talk Down To: Not on file    Threaten Physical Harm: Not on file    Scream or Curse: Not on file     OBSERVATIONS/OBJECTIVE:  BP (!) 127/52 (BP Location: Right Arm, Patient Position: Sitting) Comment: MD notified  Pulse 83   Temp 97.8 F (36.6 C) (Oral)   Resp 16   Wt 176 lb 4.8 oz (80 kg)   SpO2 100%   BMI 30.26 kg/m  GENERAL: Patient is a well appearing female in no acute distress HEENT:  Sclerae anicteric.  Oropharynx clear and moist. No ulcerations or evidence of oropharyngeal candidiasis. Neck is supple.  NODES:  No cervical, supraclavicular, or axillary lymphadenopathy palpated.  BREAST EXAM: Left breast status postlumpectomy and radiation, no sign of local recurrence right breast is benign. LUNGS:  Clear to auscultation bilaterally.  No wheezes or rhonchi. HEART:  Regular rate and rhythm. No murmur appreciated. ABDOMEN:  Soft,  nontender.   EXTREMITIES:  No peripheral edema.   SKIN:  Clear with no obvious rashes or skin changes. No nail dyscrasia. NEURO:  Nonfocal. Well oriented.  Appropriate affect.  LABORATORY DATA:  None for this visit.  DIAGNOSTIC IMAGING:  None for this visit.      ASSESSMENT AND PLAN:  Lisa Morrison is a pleasant 72 y.o. female with Stage 1A left breast invasive ductal carcinoma, ER+/PR+/HER2-, diagnosed in August 2023, treated with lumpectomy, adjuvant radiation therapy, and anti-estrogen therapy with tamoxifen beginning in 05/2022.    Bone health. Her last DEXA scan was 05/01/2021 which demonstrated osteopenia with a T-score of -1.2 in the left forearm.  She is recommended to undergo repeat testing every 2 years based on the diagnosis of osteopenia and can follow-up with her primary care provider for this testing as tamoxifen has a protective effect on the bones.  She was given education on specific activities to promote bone health.  Most recent mammogram with no evidence of malignancy.  She is tolerating tamoxifen extremely well.  She knows to contact us with asymmetrical lower extremity swelling, vaginal bleeding or sudden onset shortness of breath.  Mammogram ordered for August 2025.  Total time spent: 30 min  *Total Encounter Time as defined by the Centers for Medicare and Medicaid Services includes, in addition to the face-to-face time of a patient visit (documented in the note above) non-face-to-face time: obtaining and reviewing outside history, ordering and reviewing medications, tests or procedures, care coordination (communications with other health care professionals or caregivers) and documentation in the medical record.

## 2023-05-20 ENCOUNTER — Encounter: Payer: Self-pay | Admitting: Hematology and Oncology

## 2023-06-02 DIAGNOSIS — J3089 Other allergic rhinitis: Secondary | ICD-10-CM | POA: Diagnosis not present

## 2023-06-02 DIAGNOSIS — J301 Allergic rhinitis due to pollen: Secondary | ICD-10-CM | POA: Diagnosis not present

## 2023-06-02 DIAGNOSIS — J3081 Allergic rhinitis due to animal (cat) (dog) hair and dander: Secondary | ICD-10-CM | POA: Diagnosis not present

## 2023-06-07 ENCOUNTER — Encounter: Payer: Self-pay | Admitting: Family Medicine

## 2023-06-07 ENCOUNTER — Other Ambulatory Visit: Payer: Self-pay | Admitting: Family Medicine

## 2023-06-07 DIAGNOSIS — R0683 Snoring: Secondary | ICD-10-CM

## 2023-06-10 ENCOUNTER — Other Ambulatory Visit: Payer: Self-pay | Admitting: Family Medicine

## 2023-06-10 ENCOUNTER — Ambulatory Visit (INDEPENDENT_AMBULATORY_CARE_PROVIDER_SITE_OTHER): Payer: Medicare PPO

## 2023-06-10 DIAGNOSIS — Z0000 Encounter for general adult medical examination without abnormal findings: Secondary | ICD-10-CM | POA: Diagnosis not present

## 2023-06-10 NOTE — Progress Notes (Signed)
Subjective:   Lisa Morrison is a 73 y.o. female who presents for Medicare Annual (Subsequent) preventive examination.  Visit Complete: Virtual  I connected with  Lisa Morrison on 06/10/23 by a audio enabled telemedicine application and verified that I am speaking with the correct person using two identifiers.  Patient Location: Home  Provider Location: Office/Clinic  I discussed the limitations of evaluation and management by telemedicine. The patient expressed understanding and agreed to proceed.  Because this visit was a virtual/telehealth visit, some criteria may be missing or patient reported. Any vitals not documented were not able to be obtained and vitals that have been documented are patient reported.   Cardiac Risk Factors include: advanced age (>56men, >62 women);dyslipidemia;hypertension;obesity (BMI >30kg/m2)     Objective:    There were no vitals filed for this visit. There is no height or weight on file to calculate BMI.     06/10/2023   10:05 AM 11/10/2022    9:17 AM 08/20/2022   10:06 AM 06/16/2022    7:47 AM 06/05/2022    9:49 AM 05/22/2022    9:56 AM 05/15/2022    3:35 PM  Advanced Directives  Does Patient Have a Medical Advance Directive? No Yes No Yes Yes Yes Yes  Type of Furniture conservator/restorer;Living will Living will Healthcare Power of [place];Living will Healthcare Power of [place];Living will Healthcare Power of [employer] of [place];Living will  Does patient want to make changes to medical advance directive?  No - Patient declined  No - Patient declined No - Patient declined No - Patient declined   Copy of Healthcare Power of Attorney in Chart?  No - copy requested  No - copy requested No - copy requested    Would patient like information on creating a medical advance directive? No - Patient declined          Current Medications (verified) Outpatient Encounter Medications as of 06/10/2023  Medication Sig    acetaminophen (TYLENOL) 650 MG CR tablet Take 1,300 mg by mouth 2 (two) times daily as needed.   Budeson-Glycopyrrol-Formoterol (BREZTRI AEROSPHERE) 160-9-4.8 MCG/ACT AERO Inhale 2 puffs into the lungs in the morning and at bedtime.   buPROPion (WELLBUTRIN SR) 150 MG 12 hr tablet Take 1 tablet (150 mg total) by mouth 2 (two) times daily.   EPIPEN 2-PAK 0.3 MG/0.3ML SOAJ injection    levocetirizine (XYZAL) 5 MG tablet    lubiprostone (AMITIZA) 24 MCG capsule Take 1 capsule (24 mcg total) by mouth daily with breakfast.   meloxicam (MOBIC) 7.5 MG tablet    montelukast (SINGULAIR) 10 MG tablet TAKE 1 TABLET BY MOUTH EVERY DAY IN THE EVENING   nortriptyline (PAMELOR) 10 MG capsule TAKE 1-2 CAPSULES (10-20 MG TOTAL) BY MOUTH AT BEDTIME.   potassium chloride (MICRO-K) 10 MEQ CR capsule TAKE 2 CAPSULES BY MOUTH EVERY DAY   pregabalin (LYRICA) 150 MG capsule TAKE 1 CAPSULE BY MOUTH EVERY NIGHT   tamoxifen (NOLVADEX) 20 MG tablet Take 1 tablet (20 mg total) by mouth daily.   topiramate (TOPAMAX) 50 MG tablet TAKE 1 TABLET BY MOUTH EVERY DAY AT NIGHT   triamterene-hydrochlorothiazide (DYAZIDE) 37.5-25 MG capsule Take 1 each (1 capsule total) by mouth daily.   Calcium Carbonate-Vit D-Min (CALTRATE 600+D PLUS MINERALS) 600-800 MG-UNIT TABS Take by mouth.   No facility-administered encounter medications on file as of 06/10/2023.    Allergies (verified) Augmentin [amoxicillin-pot clavulanate], Demerol [meperidine], Amoxicillin, and Clavulanic acid   History:  Past Medical History:  Diagnosis Date   Anxiety    Arthritis    Breast cancer (HCC)    CFS (chronic fatigue syndrome)    Depression    denies now   Dyspnea    Edema    due to taking lyrica   Family history of adverse reaction to anesthesia    son had n/v   Family history of pancreatic cancer 05/03/2022   Fibromyalgia    IBS (irritable bowel syndrome)    Leaky heart valve    Migraine    Orthostatic hypotension    from a  previous surgery     PONV (postoperative nausea and vomiting)    and heart rate goes down " I fainted after surgery when standing in the restroom."   Seasonal allergies    Spondylolisthesis of lumbar region    Wears glasses    Past Surgical History:  Procedure Laterality Date   ABDOMINAL HYSTERECTOMY     back ablasion     03/2018 and 05/2018   back stimulator  2022   BACK SURGERY  2015   BREAST LUMPECTOMY WITH RADIOACTIVE SEED AND SENTINEL LYMPH NODE BIOPSY Left 05/22/2022   Procedure: LEFT BREAST LUMPECTOMY WITH RADIOACTIVE SEED AND SENTINEL LYMPH NODE BIOPSY;  Surgeon: Griselda Miner, MD;  Location: [facility];  Service: General;  Laterality: Left;   CHOLECYSTECTOMY     HERNIA REPAIR     x2   OOPHORECTOMY     VARICOSE VEIN SURGERY     Family History  Problem Relation Age of Onset   Mitral valve prolapse Mother    Coronary aneurysm Father    Diabetes Father    Pancreatic cancer Maternal Uncle 66   Healthy Son    Social History   Socioeconomic History   Marital status: Married    Spouse name: Not on file   Number of children: Not on file   Years of education: Not on file   Highest education level: Associate degree: academic program  Occupational History   Not on file  Tobacco Use   Smoking status: Never    Passive exposure: Past   Smokeless tobacco: Never  Vaping Use   Vaping status: Never Used  Substance and Sexual Activity   Alcohol use: Never   Drug use: Never   Sexual activity: Not Currently  Other Topics Concern   Not on file  Social History Narrative   Not on file   Social Determinants of Health   Financial Resource Strain: Low Risk  (06/10/2023)   Overall Financial Resource Strain (CARDIA)    Difficulty of Paying Living Expenses: Not hard at all  Food Insecurity: No Food Insecurity (06/10/2023)   Hunger Vital Sign    Worried About Running Out of Food in the Last Year: Never true    Ran Out of Food in the Last Year: Never true  Transportation Needs: No  Transportation Needs (06/10/2023)   PRAPARE - Administrator, Civil Service (Medical): No    Lack of Transportation (Non-Medical): No  Physical Activity: Insufficiently Active (06/10/2023)   Exercise Vital Sign    Days of Exercise per Week: 3 days    Minutes of Exercise per Session: 30 min  Stress: No Stress Concern Present (06/10/2023)   [employer] of Occupational Health - Occupational Stress Questionnaire    Feeling of Stress : Not at all  Social Connections: Moderately Integrated (06/10/2023)   Social Connection and Isolation Panel [NHANES]    Frequency  of Communication with Friends and Family: More than three times a week    Frequency of Social Gatherings with Friends and Family: More than three times a week    Attends Religious Services: Never    Database administrator or Organizations: Yes    Attends Engineer, structural: More than 4 times per year    Marital Status: Married    Tobacco Counseling Counseling given: Not Answered   Clinical Intake:  Pre-visit preparation completed: Yes  Pain : No/denies pain     Nutritional Status: BMI > 30  Obese Nutritional Risks: None Diabetes: No  How often do you need to have someone help you when you read instructions, pamphlets, or other written materials from your doctor or pharmacy?: 1 - Never  Interpreter Needed?: No  Information entered by :: Kennedy Bucker, LPN   Activities of Daily Living    06/10/2023   10:05 AM 06/09/2023    1:55 PM  In your present state of health, do you have any difficulty performing the following activities:  Hearing? 0 0  Vision? 0 0  Difficulty concentrating or making decisions? 0 0  Walking or climbing stairs? 1 0  Comment arthritis knees   Dressing or bathing? 0 0  Doing errands, shopping? 0 0  Preparing Food and eating ? N N  Using the Toilet? N N  In the past six months, have you accidently leaked urine? N Y  Do you have problems with loss of bowel control? N  N  Managing your Medications? N N  Managing your Finances? N N  Housekeeping or managing your Housekeeping? N N    Patient Care Team: Sharlene Dory, DO as PCP - General (Family Medicine) Griselda Miner, MD as Consulting Physician (General Surgery) Rachel Moulds, MD as Consulting Physician (Hematology and Oncology) Lonie Peak, MD as Attending Physician (Radiation Oncology)  Indicate any recent Medical Services you may have received from other than [facility] providers in the past year (date may be approximate).     Assessment:   This is a routine wellness examination for Lisa Morrison.  Hearing/Vision screen Hearing Screening - Comments:: No aids Vision Screening - Comments:: No glasses- Dr.[place]   Goals Addressed             This Visit's Progress    DIET - EAT MORE FRUITS AND VEGETABLES         Depression Screen    06/10/2023   10:03 AM 04/20/2023    8:51 AM 01/20/2023   10:32 AM 09/25/2022   11:16 AM 06/05/2022    9:53 AM 04/08/2022    8:05 AM 09/24/2021    8:17 AM  PHQ 2/9 Scores  PHQ - 2 Score 0 0 0 0 0 0 0  PHQ- 9 Score 0 0 0 0  1     Fall Risk    06/10/2023   10:05 AM 06/09/2023    1:55 PM 04/20/2023    8:50 AM 01/20/2023   10:32 AM 09/25/2022   11:16 AM  Fall Risk   Falls in the past year? 0 0 0 1 0  Number falls in past yr: 0  0 0 0  Injury with Fall? 0  0 0 0  Risk for fall due to : No Fall Risks  No Fall Risks No Fall Risks   Follow up Falls prevention discussed;Falls evaluation completed  Falls evaluation completed Falls evaluation completed Falls evaluation completed    MEDICARE RISK AT HOME: Medicare Risk  at Home Any stairs in or around the home?: Yes If so, are there any without handrails?: No Home free of loose throw rugs in walkways, pet beds, electrical cords, etc?: Yes Adequate lighting in your home to reduce risk of falls?: Yes Life alert?: No Use of a cane, walker or w/c?: No Grab bars in the bathroom?: No Shower chair or bench in  shower?: No Elevated toilet seat or a handicapped toilet?: Yes  TIMED UP AND GO:  Was the test performed?  No    Cognitive Function:        06/10/2023   10:07 AM 06/05/2022   10:10 AM  6CIT Screen  What Year? 0 points 0 points  What month? 0 points 0 points  What time? 0 points 0 points  Count back from 20 0 points 0 points  Months in reverse 0 points 0 points  Repeat phrase 0 points 0 points  Total Score 0 points 0 points    Immunizations Immunization History  Administered Date(s) Administered   Influenza, High Dose Seasonal PF 07/06/2018, 06/21/2019, 05/08/2020   Influenza,inj,Quad PF,6+ Mos 06/30/2017   Influenza-Unspecified 06/24/2016, 05/08/2021   Janssen (J&J) SARS-COV-2 Vaccination 05/02/2020, 07/03/2020   PNEUMOCOCCAL CONJUGATE-20 11/23/2020   Pneumococcal Conjugate-13 06/30/2017   Zoster Recombinant(Shingrix) 08/16/2018, 11/23/2020   Zoster, Live 09/08/2011    TDAP status: Due, Education has been provided regarding the importance of this vaccine. Advised may receive this vaccine at local pharmacy or Health Dept. Aware to provide a copy of the vaccination record if obtained from local pharmacy or Health Dept. Verbalized acceptance and understanding.  Flu Vaccine status: Due, Education has been provided regarding the importance of this vaccine. Advised may receive this vaccine at local pharmacy or Health Dept. Aware to provide a copy of the vaccination record if obtained from local pharmacy or Health Dept. Verbalized acceptance and understanding.  Pneumococcal vaccine status: Up to date  Covid-19 vaccine status: Completed vaccines  Qualifies for Shingles Vaccine? Yes   Zostavax completed Yes   Shingrix Completed?: Yes  Screening Tests Health Maintenance  Topic Date Due   DTaP/Tdap/Td (1 - Tdap) Never done   INFLUENZA VACCINE  04/08/2023   DEXA SCAN  05/02/2023   MAMMOGRAM  05/02/2024   Medicare Annual Wellness (AWV)  06/09/2024   Colonoscopy  12/02/2027    Pneumonia Vaccine 24+ Years old  Completed   Hepatitis C Screening  Completed   Zoster Vaccines- Shingrix  Completed   HPV VACCINES  Aged Out   COVID-19 Vaccine  Discontinued    Health Maintenance  Health Maintenance Due  Topic Date Due   DTaP/Tdap/Td (1 - Tdap) Never done   INFLUENZA VACCINE  04/08/2023   DEXA SCAN  05/02/2023    Colorectal cancer screening: Type of screening: Colonoscopy. Completed 12/01/17. Repeat every 10 years  Mammogram status: Completed 05/03/23. Repeat every year  Bone Density status: Completed 05/01/21. Results reflect: Bone density results: OSTEOPENIA. Repeat every 5 years.  Lung Cancer Screening: (Low Dose CT Chest recommended if Age 41-80 years, 20 pack-year currently smoking OR have quit w/in 15years.) does not qualify.    Additional Screening:  Hepatitis C Screening: does qualify; Completed 03/25/21  Vision Screening: Recommended annual ophthalmology exams for early detection of glaucoma and other disorders of the eye. Is the patient up to date with their annual eye exam?  Yes  Who is the provider or what is the name of the office in which the patient attends annual eye exams? Dr.Wynne If pt is  not established with a provider, would they like to be referred to a provider to establish care? No .   Dental Screening: Recommended annual dental exams for proper oral hygiene   Community Resource Referral / Chronic Care Management: CRR required this visit?  No   CCM required this visit?  No     Plan:     I have personally reviewed and noted the following in the patient's chart:   Medical and social history Use of alcohol, tobacco or illicit drugs  Current medications and supplements including opioid prescriptions. Patient is not currently taking opioid prescriptions. Functional ability and status Nutritional status Physical activity Advanced directives List of other physicians Hospitalizations, surgeries, and ER visits in previous 12  months Vitals Screenings to include cognitive, depression, and falls Referrals and appointments  In addition, I have reviewed and discussed with patient certain preventive protocols, quality metrics, and best practice recommendations. A written personalized care plan for preventive services as well as general preventive health recommendations were provided to patient.    Hal Hope, LPN   [id number]   After Visit Summary: (MyChart) Due to this being a telephonic visit, the after visit summary with patients personalized plan was offered to patient via MyChart   Nurse Notes: none

## 2023-06-10 NOTE — Patient Instructions (Addendum)
Lisa Morrison , Thank you for taking time to come for your Medicare Wellness Visit. I appreciate your ongoing commitment to your health goals. Please review the following plan we discussed and let me know if I can assist you in the future.   Referrals/Orders/Follow-Ups/Clinician Recommendations: none  This is a list of the screening recommended for you and due dates:  Health Maintenance  Topic Date Due   DTaP/Tdap/Td vaccine (1 - Tdap) Never done   Flu Shot  04/08/2023   DEXA scan (bone density measurement)  05/02/2023   Mammogram  05/02/2024   Medicare Annual Wellness Visit  06/09/2024   Colon Cancer Screening  12/02/2027   Pneumonia Vaccine  Completed   Hepatitis C Screening  Completed   Zoster (Shingles) Vaccine  Completed   HPV Vaccine  Aged Out   COVID-19 Vaccine  Discontinued    Advanced directives: (ACP Link)Information on Advanced Care Planning can be found at [facility] of [place] Advance Health Care Directives Advance Health Care Directives ([email or web address]   Next Medicare Annual Wellness Visit scheduled for next year: Yes   06/13/24 @ 10:20 am by video

## 2023-06-16 DIAGNOSIS — J301 Allergic rhinitis due to pollen: Secondary | ICD-10-CM | POA: Diagnosis not present

## 2023-06-16 DIAGNOSIS — J3081 Allergic rhinitis due to animal (cat) (dog) hair and dander: Secondary | ICD-10-CM | POA: Diagnosis not present

## 2023-06-16 DIAGNOSIS — J3089 Other allergic rhinitis: Secondary | ICD-10-CM | POA: Diagnosis not present

## 2023-06-30 DIAGNOSIS — J3089 Other allergic rhinitis: Secondary | ICD-10-CM | POA: Diagnosis not present

## 2023-06-30 DIAGNOSIS — J301 Allergic rhinitis due to pollen: Secondary | ICD-10-CM | POA: Diagnosis not present

## 2023-06-30 DIAGNOSIS — J3081 Allergic rhinitis due to animal (cat) (dog) hair and dander: Secondary | ICD-10-CM | POA: Diagnosis not present

## 2023-07-02 ENCOUNTER — Other Ambulatory Visit: Payer: Self-pay | Admitting: Family Medicine

## 2023-07-02 ENCOUNTER — Other Ambulatory Visit: Payer: Self-pay | Admitting: Hematology and Oncology

## 2023-07-02 DIAGNOSIS — R519 Headache, unspecified: Secondary | ICD-10-CM

## 2023-07-02 DIAGNOSIS — G43109 Migraine with aura, not intractable, without status migrainosus: Secondary | ICD-10-CM

## 2023-07-14 DIAGNOSIS — J3081 Allergic rhinitis due to animal (cat) (dog) hair and dander: Secondary | ICD-10-CM | POA: Diagnosis not present

## 2023-07-14 DIAGNOSIS — J3089 Other allergic rhinitis: Secondary | ICD-10-CM | POA: Diagnosis not present

## 2023-07-14 DIAGNOSIS — J301 Allergic rhinitis due to pollen: Secondary | ICD-10-CM | POA: Diagnosis not present

## 2023-07-28 DIAGNOSIS — J3089 Other allergic rhinitis: Secondary | ICD-10-CM | POA: Diagnosis not present

## 2023-07-28 DIAGNOSIS — J301 Allergic rhinitis due to pollen: Secondary | ICD-10-CM | POA: Diagnosis not present

## 2023-07-28 DIAGNOSIS — J3081 Allergic rhinitis due to animal (cat) (dog) hair and dander: Secondary | ICD-10-CM | POA: Diagnosis not present

## 2023-08-10 ENCOUNTER — Ambulatory Visit: Payer: Medicare PPO | Admitting: Podiatry

## 2023-08-10 ENCOUNTER — Encounter: Payer: Self-pay | Admitting: Podiatry

## 2023-08-10 DIAGNOSIS — B351 Tinea unguium: Secondary | ICD-10-CM

## 2023-08-10 DIAGNOSIS — M79674 Pain in right toe(s): Secondary | ICD-10-CM | POA: Diagnosis not present

## 2023-08-10 DIAGNOSIS — M79675 Pain in left toe(s): Secondary | ICD-10-CM | POA: Diagnosis not present

## 2023-08-10 DIAGNOSIS — Q828 Other specified congenital malformations of skin: Secondary | ICD-10-CM

## 2023-08-10 NOTE — Progress Notes (Unsigned)
Subjective: Chief Complaint  Patient presents with   Callouses    RM#12 Callus on left foot and nail trim    72 year old female presents the office they for concerns of elongated, thick toenails that are hard for her to trim and for a mild callus on the left foot that has been causing pain.  She was not wearing shoes regularly around the house and she recently purchased a new pair of oofos.   Objective: AAO x3, NAD DP/PT pulses palpable bilaterally, CRT less than 3 seconds Nails are hypertrophic, dystrophic, brittle, discolored, elongated 10. No surrounding redness or drainage. Tenderness nails 1-5 bilaterally. No open lesions or pre-ulcerative lesions are identified today. Hyperkeratotic tissue is present left foot medial hallux and left submetatarsal area.  There is no ongoing ulceration drainage or signs of infection.  Calluses seem to be somewhat larger today compared to prior but there is no ulceration. Hammertoes present bilaterally No pain with calf compression, swelling, warmth, erythema No acute changes otherwsie  Assessment: Symptomatic onychomycosis, hyperkeratotic lesion  Plan: -All treatment options discussed with the patient including all alternatives, risks, complications.  -Nails sharply debrided x 10 without complications or bleeding.  -Hyperkeratotic lesions sharply debrided x 2 without any complications or bleeding. Continue with supportive shoes for offloading.  Moisturizer. -Daily foot inspection  Ovid Curd, DPM

## 2023-08-11 ENCOUNTER — Other Ambulatory Visit: Payer: Self-pay | Admitting: Family Medicine

## 2023-08-11 DIAGNOSIS — J3081 Allergic rhinitis due to animal (cat) (dog) hair and dander: Secondary | ICD-10-CM | POA: Diagnosis not present

## 2023-08-11 DIAGNOSIS — J301 Allergic rhinitis due to pollen: Secondary | ICD-10-CM | POA: Diagnosis not present

## 2023-08-11 DIAGNOSIS — J3089 Other allergic rhinitis: Secondary | ICD-10-CM | POA: Diagnosis not present

## 2023-08-16 ENCOUNTER — Ambulatory Visit: Payer: Medicare PPO | Admitting: Adult Health

## 2023-08-16 ENCOUNTER — Encounter: Payer: Self-pay | Admitting: Adult Health

## 2023-08-16 VITALS — BP 118/78 | HR 80 | Temp 97.9°F | Ht 64.0 in | Wt 175.2 lb

## 2023-08-16 DIAGNOSIS — R0683 Snoring: Secondary | ICD-10-CM | POA: Insufficient documentation

## 2023-08-16 NOTE — Assessment & Plan Note (Signed)
Snoring, daytime sleepiness concerning for underlying sleep apnea.  Will set patient up for home sleep study.  Patient education given on sleep apnea  - discussed how weight can impact sleep and risk for sleep disordered breathing - discussed options to assist with weight loss: combination of diet modification, cardiovascular and strength training exercises   - had an extensive discussion regarding the adverse health consequences related to untreated sleep disordered breathing - specifically discussed the risks for hypertension, coronary artery disease, cardiac dysrhythmias, cerebrovascular disease, and diabetes - lifestyle modification discussed   - discussed how sleep disruption can increase risk of accidents, particularly when driving - safe driving practices were discussed   Plan  Patient Instructions  Set up for home sleep study.  Work on healthy sleep regimen  Work on healthy weight  Follow up in 6 weeks to discuss results and treatment plan .

## 2023-08-16 NOTE — Patient Instructions (Signed)
Set up for home sleep study.  Work on healthy sleep regimen  Work on healthy weight  Follow up in 6 weeks to discuss results and treatment plan .

## 2023-08-16 NOTE — Progress Notes (Signed)
@Patient  ID: Lisa Morrison, female    DOB: 10/26/1950, 72 y.o.   MRN: [id number]  Chief Complaint  Patient presents with   Consult    Referring provider: Sharlene Dory*  Discussed the use of AI scribe software for clinical note transcription with the patient, who gave verbal consent to proceed.  HPI: 72 yo female seen for sleep consult for snoring and daytime sleepiness   TEST/EVENTS :   08/16/2023 Sleep Consult  Patient presents for sleep consult today.  Kindly referred by primary care provider Dr. Carmelia Roller.  The patient is a 72 year old female who presents with complaints of snoring and daytime sleepiness. The patient reports that her snoring is disruptive to her husband's sleep and she also experiences daytime fatigue, which she attributes to her chronic fatigue syndrome secondary to fibromyalgia. Despite the fatigue, the patient maintains an active lifestyle.   The patient's sleep is reportedly restful, but she has been told that she talks in her sleep and snores loudly.  Typically goes to bed about 11 PM.  Gets up at 5 AM.  Goes to sleep very quickly.  She does not nap.  Her weight is steady at 175 pounds with a BMI at 30.  She has never had a sleep study before.  Does not take any sleep aids.  Has no history of congestive heart failure or stroke.  She has no removable dental work.  She does not take any sleep aids.  She does have chronic back pain.  She remains on Lyrica and nortriptyline.  Did have restless leg syndrome previously but after starting Lyrica this resolved totally.  No symptoms suspicious for cataplexy or sleep paralysis.  Epworth score is 6 out of 24.  Gets sleepy if she sits down to rest, watch TV and in the afternoon hours.  She also has a back stimulator for chronic pain management. The patient's family history, social history, and review of systems were otherwise unremarkable. The patient is primarily concerned about her snoring and its potential impact on  her husband's sleep, as well as her own sleep quality and daytime fatigue. She is open to further evaluation and potential treatment for her sleep issues.     Social history patient is married.  She is retired.  Lives at home with her husband.  She is a never smoker.  No alcohol or drugs.  She is from [place].  Moved to [place] in 2016 to be closer to family.  She is very active and helps with her grandchildren.  Says she is busier now than when she works full-time.  Family history positive for emphysema, allergies, heart disease  Past Surgical History:  Procedure Laterality Date   ABDOMINAL HYSTERECTOMY     back ablasion     03/2018 and 05/2018   back stimulator  2022   BACK SURGERY  2015   BREAST LUMPECTOMY WITH RADIOACTIVE SEED AND SENTINEL LYMPH NODE BIOPSY Left 05/22/2022   Procedure: LEFT BREAST LUMPECTOMY WITH RADIOACTIVE SEED AND SENTINEL LYMPH NODE BIOPSY;  Surgeon: Griselda Miner, MD;  Location: [facility];  Service: General;  Laterality: Left;   CHOLECYSTECTOMY     HERNIA REPAIR     x2   OOPHORECTOMY     VARICOSE VEIN SURGERY        Allergies  Allergen Reactions   Augmentin [Amoxicillin-Pot Clavulanate] Rash    Did it involve swelling of the face/tongue/throat, SOB, or low BP? Yes Did it involve sudden or severe  rash/hives, skin peeling, or any reaction on the inside of your mouth or nose? Yes Did you need to seek medical attention at a hospital or doctor's office? Yes When did it last happen? Over 10 years ago If all above answers are "NO", may proceed with cephalosporin use.   Demerol [Meperidine] Other (See Comments)    Lowers blood pressure    Amoxicillin     Other reaction(s): Loss of consciousness   Clavulanic Acid     Other reaction(s): lowers blood pressure    Immunization History  Administered Date(s) Administered   Influenza, High Dose Seasonal PF 07/06/2018, 06/21/2019, 05/08/2020   Influenza,inj,Quad PF,6+ Mos 06/30/2017    Influenza-Unspecified 06/24/2016, 05/08/2021   Janssen (J&J) SARS-COV-2 Vaccination 05/02/2020, 07/03/2020   PNEUMOCOCCAL CONJUGATE-20 11/23/2020   Pneumococcal Conjugate-13 06/30/2017   Zoster Recombinant(Shingrix) 08/16/2018, 11/23/2020   Zoster, Live 09/08/2011    Past Medical History:  Diagnosis Date   Anxiety    Arthritis    Breast cancer (HCC)    CFS (chronic fatigue syndrome)    Depression    denies now   Dyspnea    Edema    due to taking lyrica   Family history of adverse reaction to anesthesia    son had n/v   Family history of pancreatic cancer 05/03/2022   Fibromyalgia    IBS (irritable bowel syndrome)    Leaky heart valve    Migraine    Orthostatic hypotension    from a  previous surgery    PONV (postoperative nausea and vomiting)    and heart rate goes down " I fainted after surgery when standing in the restroom."   Seasonal allergies    Spondylolisthesis of lumbar region    Wears glasses    Chronic back pain   Tobacco History: Social History   Tobacco Use  Smoking Status Never   Passive exposure: Past  Smokeless Tobacco Never   Counseling given: Not Answered   Outpatient Medications Prior to Visit  Medication Sig Dispense Refill   acetaminophen (TYLENOL) 650 MG CR tablet Take 1,300 mg by mouth 2 (two) times daily as needed.     Budeson-Glycopyrrol-Formoterol (BREZTRI AEROSPHERE) 160-9-4.8 MCG/ACT AERO Inhale 2 puffs into the lungs in the morning and at bedtime. 10.7 g 5   buPROPion (WELLBUTRIN SR) 150 MG 12 hr tablet Take 1 tablet (150 mg total) by mouth 2 (two) times daily. 180 tablet 1   EPIPEN 2-PAK 0.3 MG/0.3ML SOAJ injection      levocetirizine (XYZAL) 5 MG tablet      lubiprostone (AMITIZA) 24 MCG capsule TAKE 1 CAPSULE (24 MCG TOTAL) BY MOUTH DAILY WITH BREAKFAST. 90 capsule 2   meloxicam (MOBIC) 7.5 MG tablet      montelukast (SINGULAIR) 10 MG tablet TAKE 1 TABLET BY MOUTH EVERY DAY IN THE EVENING 90 tablet 2   nortriptyline (PAMELOR) 10  MG capsule TAKE 1-2 CAPSULES (10-20 MG TOTAL) BY MOUTH AT BEDTIME. 180 capsule 2   potassium chloride (MICRO-K) 10 MEQ CR capsule TAKE 2 CAPSULES BY MOUTH EVERY DAY 180 capsule 1   pregabalin (LYRICA) 150 MG capsule TAKE 1 CAPSULE BY MOUTH EVERY NIGHT 90 capsule 1   tamoxifen (NOLVADEX) 20 MG tablet TAKE 1 TABLET BY MOUTH EVERY DAY 90 tablet 3   topiramate (TOPAMAX) 50 MG tablet TAKE 1 TABLET BY MOUTH TWICE A DAY 180 tablet 1   triamterene-hydrochlorothiazide (DYAZIDE) 37.5-25 MG capsule Take 1 each (1 capsule total) by mouth daily. 90 capsule 1   Calcium  Carbonate-Vit D-Min (CALTRATE 600+D PLUS MINERALS) 600-800 MG-UNIT TABS Take by mouth.     No facility-administered medications prior to visit.     Review of Systems:   Constitutional:   No  weight loss, night sweats,  Fevers, chills, +fatigue, or  lassitude.  HEENT:   No headaches,  Difficulty swallowing,  Tooth/dental problems, or  Sore throat,                No sneezing, itching, ear ache, nasal congestion, post nasal drip,   CV:  No chest pain,  Orthopnea, PND, swelling in lower extremities, anasarca, dizziness, palpitations, syncope.   GI  No heartburn, indigestion, abdominal pain, nausea, vomiting, diarrhea, change in bowel habits, loss of appetite, bloody stools.   Resp: No shortness of breath with exertion or at rest.  No excess mucus, no productive cough,  No non-productive cough,  No coughing up of blood.  No change in color of mucus.  No wheezing.  No chest wall deformity  Skin: no rash or lesions.  GU: no dysuria, change in color of urine, no urgency or frequency.  No flank pain, no hematuria   MS:  No joint pain or swelling.  No decreased range of motion.  +back pain.    Physical Exam  BP 118/78 (BP Location: Right Arm, Patient Position: Sitting, Cuff Size: Small)   Pulse 80   Temp 97.9 F (36.6 C) (Oral)   Ht 5\' 4"  (1.626 m)   Wt 175 lb 3.2 oz (79.5 kg)   SpO2 98%   BMI 30.07 kg/m   GEN: A/Ox3; pleasant ,  NAD, well nourished    HEENT:  [place]/AT, l, NOSE-clear, THROAT-clear, no lesions, no postnasal drip or exudate noted.  Class II-III MP airway  NECK:  Supple w/ fair ROM; no JVD; normal carotid impulses w/o bruits; no thyromegaly or nodules palpated; no lymphadenopathy.    RESP  Clear  P & A; w/o, wheezes/ rales/ or rhonchi. no accessory muscle use, no dullness to percussion  CARD:  RRR, no m/r/g, no peripheral edema, pulses intact, no cyanosis or clubbing.  GI:   Soft & nt; nml bowel sounds; no organomegaly or masses detected.   Musco: Warm bil, no deformities or joint swelling noted.   Neuro: alert, no focal deficits noted.    Skin: Warm, no lesions or rashes    Lab Results:  CBC     BNP No results found for: "BNP"  ProBNP No results found for: "PROBNP"  Imaging: No results found.  Administration History     None           No data to display          No results found for: "NITRICOXIDE"      Assessment & Plan:   Snoring Snoring, daytime sleepiness concerning for underlying sleep apnea.  Will set patient up for home sleep study.  Patient education given on sleep apnea  - discussed how weight can impact sleep and risk for sleep disordered breathing - discussed options to assist with weight loss: combination of diet modification, cardiovascular and strength training exercises   - had an extensive discussion regarding the adverse health consequences related to untreated sleep disordered breathing - specifically discussed the risks for hypertension, coronary artery disease, cardiac dysrhythmias, cerebrovascular disease, and diabetes - lifestyle modification discussed   - discussed how sleep disruption can increase risk of accidents, particularly when driving - safe driving practices were discussed   Plan  Patient Instructions  Set up for home sleep study.  Work on healthy sleep regimen  Work on healthy weight  Follow up in 6 weeks to discuss results  and treatment plan .        Rubye Oaks, NP 08/16/2023

## 2023-08-24 DIAGNOSIS — G4730 Sleep apnea, unspecified: Secondary | ICD-10-CM | POA: Diagnosis not present

## 2023-08-24 DIAGNOSIS — R0683 Snoring: Secondary | ICD-10-CM

## 2023-08-25 DIAGNOSIS — J301 Allergic rhinitis due to pollen: Secondary | ICD-10-CM | POA: Diagnosis not present

## 2023-08-25 DIAGNOSIS — J3081 Allergic rhinitis due to animal (cat) (dog) hair and dander: Secondary | ICD-10-CM | POA: Diagnosis not present

## 2023-08-25 DIAGNOSIS — J3089 Other allergic rhinitis: Secondary | ICD-10-CM | POA: Diagnosis not present

## 2023-09-07 ENCOUNTER — Other Ambulatory Visit: Payer: Self-pay | Admitting: Family Medicine

## 2023-09-07 NOTE — Telephone Encounter (Signed)
Requesting: Lyrica 150mg   Contract: None UDS: None Last Visit: 04/20/23 Next Visit: 10/20/23 Last Refill: 03/18/23 #90 and 1RF  Please Advise

## 2023-09-09 DIAGNOSIS — J3081 Allergic rhinitis due to animal (cat) (dog) hair and dander: Secondary | ICD-10-CM | POA: Diagnosis not present

## 2023-09-09 DIAGNOSIS — J3089 Other allergic rhinitis: Secondary | ICD-10-CM | POA: Diagnosis not present

## 2023-09-09 DIAGNOSIS — J301 Allergic rhinitis due to pollen: Secondary | ICD-10-CM | POA: Diagnosis not present

## 2023-09-15 ENCOUNTER — Encounter: Payer: Self-pay | Admitting: Podiatry

## 2023-09-16 DIAGNOSIS — H26493 Other secondary cataract, bilateral: Secondary | ICD-10-CM | POA: Diagnosis not present

## 2023-09-16 DIAGNOSIS — H16223 Keratoconjunctivitis sicca, not specified as Sjogren's, bilateral: Secondary | ICD-10-CM | POA: Diagnosis not present

## 2023-09-16 DIAGNOSIS — H40013 Open angle with borderline findings, low risk, bilateral: Secondary | ICD-10-CM | POA: Diagnosis not present

## 2023-09-16 DIAGNOSIS — H35373 Puckering of macula, bilateral: Secondary | ICD-10-CM | POA: Diagnosis not present

## 2023-09-16 DIAGNOSIS — H04123 Dry eye syndrome of bilateral lacrimal glands: Secondary | ICD-10-CM | POA: Diagnosis not present

## 2023-09-16 DIAGNOSIS — Z961 Presence of intraocular lens: Secondary | ICD-10-CM | POA: Diagnosis not present

## 2023-09-16 DIAGNOSIS — H5052 Exophoria: Secondary | ICD-10-CM | POA: Diagnosis not present

## 2023-10-06 DIAGNOSIS — J3089 Other allergic rhinitis: Secondary | ICD-10-CM | POA: Diagnosis not present

## 2023-10-06 DIAGNOSIS — J301 Allergic rhinitis due to pollen: Secondary | ICD-10-CM | POA: Diagnosis not present

## 2023-10-06 DIAGNOSIS — J3081 Allergic rhinitis due to animal (cat) (dog) hair and dander: Secondary | ICD-10-CM | POA: Diagnosis not present

## 2023-10-11 ENCOUNTER — Ambulatory Visit: Payer: Medicare PPO | Admitting: Adult Health

## 2023-10-11 ENCOUNTER — Encounter: Payer: Self-pay | Admitting: Adult Health

## 2023-10-11 ENCOUNTER — Other Ambulatory Visit: Payer: Self-pay | Admitting: Family Medicine

## 2023-10-11 VITALS — BP 118/60 | HR 95 | Ht 64.0 in | Wt 179.2 lb

## 2023-10-11 DIAGNOSIS — G4733 Obstructive sleep apnea (adult) (pediatric): Secondary | ICD-10-CM | POA: Diagnosis not present

## 2023-10-11 NOTE — Progress Notes (Signed)
@Patient  ID: Lisa Morrison, female    DOB: 1950-10-03, 73 y.o.   MRN: [id number]  Chief Complaint  Patient presents with   Follow-up    Referring provider: Sharlene Dory*  HPI: 73 year old female seen for sleep consult August 16, 2023 for snoring and daytime sleepiness found to have mild obstructive sleep apnea  TEST/EVENTS :   10/11/2023 Follow up : OSA  Patient presents for a 57-month follow-up.  Patient was seen last visit for sleep consult.  She has snoring and daytime sleepiness.  Her snoring was disruptive to her sleep and her husband sleep.  Patient was set up for home sleep study that was done July 25, 2023.  This showed mild sleep apnea with AHI at 7.4/hour and SpO2 low at 79%.  We discussed her sleep study results in detail.  We went over treatment options including weight loss, oral appliance, sleeping with her head of the bed at 30 degree incline.,  CPAP.  Patient says she wants to think about all of these options and is going to discuss with her dentist who does oral appliances for sleep apnea to see if it might be an option for her and affordable through her insurance.  She also says that she is going to think about CPAP and wants to get back in touch with me when she makes a decision.  Her husband has sleep apnea is our patient. Uses a dream nasal mask. Patient says she would have to use this if she chooses CPAP because she is claustrophobic   Allergies  Allergen Reactions   Augmentin [Amoxicillin-Pot Clavulanate] Rash    Did it involve swelling of the face/tongue/throat, SOB, or low BP? Yes Did it involve sudden or severe rash/hives, skin peeling, or any reaction on the inside of your mouth or nose? Yes Did you need to seek medical attention at a hospital or doctor's office? Yes When did it last happen? Over 10 years ago If all above answers are "NO", may proceed with cephalosporin use.   Demerol [Meperidine] Other (See Comments)    Lowers blood pressure     Amoxicillin     Other reaction(s): Loss of consciousness   Clavulanic Acid     Other reaction(s): lowers blood pressure    Immunization History  Administered Date(s) Administered   Influenza, High Dose Seasonal PF 07/06/2018, 06/21/2019, 05/08/2020   Influenza,inj,Quad PF,6+ Mos 06/30/2017   Influenza-Unspecified 06/24/2016, 05/08/2021   Janssen (J&J) SARS-COV-2 Vaccination 05/02/2020, 07/03/2020   PNEUMOCOCCAL CONJUGATE-20 11/23/2020   Pneumococcal Conjugate-13 06/30/2017   Zoster Recombinant(Shingrix) 08/16/2018, 11/23/2020   Zoster, Live 09/08/2011    Past Medical History:  Diagnosis Date   Anxiety    Arthritis    Breast cancer (HCC)    CFS (chronic fatigue syndrome)    Depression    denies now   Dyspnea    Edema    due to taking lyrica   Family history of adverse reaction to anesthesia    son had n/v   Family history of pancreatic cancer 05/03/2022   Fibromyalgia    IBS (irritable bowel syndrome)    Leaky heart valve    Migraine    Orthostatic hypotension    from a  previous surgery    PONV (postoperative nausea and vomiting)    and heart rate goes down " I fainted after surgery when standing in the restroom."   Seasonal allergies    Spondylolisthesis of lumbar region    Wears glasses  Tobacco History: Social History   Tobacco Use  Smoking Status Never   Passive exposure: Past  Smokeless Tobacco Never   Counseling given: Not Answered   Outpatient Medications Prior to Visit  Medication Sig Dispense Refill   acetaminophen (TYLENOL) 650 MG CR tablet Take 1,300 mg by mouth 2 (two) times daily as needed.     Budeson-Glycopyrrol-Formoterol (BREZTRI AEROSPHERE) 160-9-4.8 MCG/ACT AERO Inhale 2 puffs into the lungs in the morning and at bedtime. 10.7 g 5   buPROPion (WELLBUTRIN SR) 150 MG 12 hr tablet Take 1 tablet (150 mg total) by mouth 2 (two) times daily. 180 tablet 1   EPIPEN 2-PAK 0.3 MG/0.3ML SOAJ injection      levocetirizine (XYZAL) 5 MG tablet       lubiprostone (AMITIZA) 24 MCG capsule TAKE 1 CAPSULE (24 MCG TOTAL) BY MOUTH DAILY WITH BREAKFAST. 90 capsule 2   meloxicam (MOBIC) 7.5 MG tablet      montelukast (SINGULAIR) 10 MG tablet TAKE 1 TABLET BY MOUTH EVERY DAY IN THE EVENING 90 tablet 2   nortriptyline (PAMELOR) 10 MG capsule TAKE 1-2 CAPSULES (10-20 MG TOTAL) BY MOUTH AT BEDTIME. 180 capsule 2   potassium chloride (MICRO-K) 10 MEQ CR capsule TAKE 2 CAPSULES BY MOUTH EVERY DAY 180 capsule 1   pregabalin (LYRICA) 150 MG capsule TAKE 1 CAPSULE BY MOUTH EVERY DAY AT NIGHT 90 capsule 1   tamoxifen (NOLVADEX) 20 MG tablet TAKE 1 TABLET BY MOUTH EVERY DAY 90 tablet 3   topiramate (TOPAMAX) 50 MG tablet TAKE 1 TABLET BY MOUTH TWICE A DAY 180 tablet 1   triamterene-hydrochlorothiazide (DYAZIDE) 37.5-25 MG capsule Take 1 each (1 capsule total) by mouth daily. 90 capsule 1   Calcium Carbonate-Vit D-Min (CALTRATE 600+D PLUS MINERALS) 600-800 MG-UNIT TABS Take by mouth.     No facility-administered medications prior to visit.     Review of Systems:   Constitutional:   No  weight loss, night sweats,  Fevers, chills, +fatigue, or  lassitude.  HEENT:   No headaches,  Difficulty swallowing,  Tooth/dental problems, or  Sore throat,                No sneezing, itching, ear ache, nasal congestion, post nasal drip,   CV:  No chest pain,  Orthopnea, PND, swelling in lower extremities, anasarca, dizziness, palpitations, syncope.   GI  No heartburn, indigestion, abdominal pain, nausea, vomiting, diarrhea, change in bowel habits, loss of appetite, bloody stools.   Resp: No shortness of breath with exertion or at rest.  No excess mucus, no productive cough,  No non-productive cough,  No coughing up of blood.  No change in color of mucus.  No wheezing.  No chest wall deformity  Skin: no rash or lesions.  GU: no dysuria, change in color of urine, no urgency or frequency.  No flank pain, no hematuria   MS:  No joint pain or swelling.  No decreased  range of motion.  No back pain.    Physical Exam  BP 118/60 (BP Location: Right Arm, Patient Position: Sitting, Cuff Size: Large)   Pulse 95   Ht 5\' 4"  (1.626 m)   Wt 179 lb 3.2 oz (81.3 kg)   SpO2 98%   BMI 30.76 kg/m   GEN: A/Ox3; pleasant , NAD, well nourished    HEENT:  [place]/AT, NOSE-clear, THROAT-clear, no lesions, no postnasal drip or exudate noted.  Class 2-3 MP airway   NECK:  Supple w/ fair ROM; no JVD; normal  carotid impulses w/o bruits; no thyromegaly or nodules palpated; no lymphadenopathy.    RESP  Clear  P & A; w/o, wheezes/ rales/ or rhonchi. no accessory muscle use, no dullness to percussion  CARD:  RRR, no m/r/g, no peripheral edema, pulses intact, no cyanosis or clubbing.  GI:   Soft & nt; nml bowel sounds; no organomegaly or masses detected.   Musco: Warm bil, no deformities or joint swelling noted.   Neuro: alert, no focal deficits noted.    Skin: Warm, no lesions or rashes    Lab Results:    BNP No results found for: "BNP"  ProBNP No results found for: "PROBNP"  Imaging: No results found.  Administration History     None           No data to display          No results found for: "NITRICOXIDE"      Assessment & Plan:   OSA (obstructive sleep apnea) Mild obstructive sleep apnea.  We discussed her sleep study results in detail.  Patient education was given on sleep apnea along with treatment options.  We discussed weight loss, oral appliance, positional sleep and CPAP therapy.  Patient wants to think about all these options and will get back to [place] when she decides on oral appliance or CPAP therapy.  - discussed how weight can impact sleep and risk for sleep disordered breathing - discussed options to assist with weight loss: combination of diet modification, cardiovascular and strength training exercises   - had an extensive discussion regarding the adverse health consequences related to untreated sleep disordered  breathing - specifically discussed the risks for hypertension, coronary artery disease, cardiac dysrhythmias, cerebrovascular disease, and diabetes - lifestyle modification discussed   - discussed how sleep disruption can increase risk of accidents, particularly when driving - safe driving practices were discussed   Plan  Patient Instructions  Call back or send message once you decide on Oral appliance for Sleep apnea.  If you decide on CPAP, let me know Dr Myrtis Ser [phone] -orthodontics for oral appliance in [place].  Work on healthy weight loss Do not drive if sleepy  Follow up in 6 months and As needed          [employer], NP 10/11/2023

## 2023-10-11 NOTE — Patient Instructions (Addendum)
Call back or send message once you decide on Oral appliance for Sleep apnea.  If you decide on CPAP, let me know Dr Myrtis Ser [phone] -orthodontics for oral appliance in [place].  Work on healthy weight loss Do not drive if sleepy  Follow up in 6 months and As needed

## 2023-10-11 NOTE — Assessment & Plan Note (Signed)
Mild obstructive sleep apnea.  We discussed her sleep study results in detail.  Patient education was given on sleep apnea along with treatment options.  We discussed weight loss, oral appliance, positional sleep and CPAP therapy.  Patient wants to think about all these options and will get back to [place] when she decides on oral appliance or CPAP therapy.  - discussed how weight can impact sleep and risk for sleep disordered breathing - discussed options to assist with weight loss: combination of diet modification, cardiovascular and strength training exercises   - had an extensive discussion regarding the adverse health consequences related to untreated sleep disordered breathing - specifically discussed the risks for hypertension, coronary artery disease, cardiac dysrhythmias, cerebrovascular disease, and diabetes - lifestyle modification discussed   - discussed how sleep disruption can increase risk of accidents, particularly when driving - safe driving practices were discussed   Plan  Patient Instructions  Call back or send message once you decide on Oral appliance for Sleep apnea.  If you decide on CPAP, let me know Dr Myrtis Ser [phone] -orthodontics for oral appliance in [place].  Work on healthy weight loss Do not drive if sleepy  Follow up in 6 months and As needed

## 2023-10-13 DIAGNOSIS — J301 Allergic rhinitis due to pollen: Secondary | ICD-10-CM | POA: Diagnosis not present

## 2023-10-13 DIAGNOSIS — J3081 Allergic rhinitis due to animal (cat) (dog) hair and dander: Secondary | ICD-10-CM | POA: Diagnosis not present

## 2023-10-13 DIAGNOSIS — G4733 Obstructive sleep apnea (adult) (pediatric): Secondary | ICD-10-CM

## 2023-10-13 DIAGNOSIS — J3089 Other allergic rhinitis: Secondary | ICD-10-CM | POA: Diagnosis not present

## 2023-10-20 ENCOUNTER — Ambulatory Visit: Payer: Medicare PPO | Admitting: Family Medicine

## 2023-10-20 ENCOUNTER — Encounter: Payer: Self-pay | Admitting: Family Medicine

## 2023-10-20 VITALS — BP 128/74 | HR 78 | Temp 98.0°F | Resp 16 | Ht 64.0 in | Wt 180.8 lb

## 2023-10-20 DIAGNOSIS — N8110 Cystocele, unspecified: Secondary | ICD-10-CM | POA: Diagnosis not present

## 2023-10-20 DIAGNOSIS — G43109 Migraine with aura, not intractable, without status migrainosus: Secondary | ICD-10-CM | POA: Diagnosis not present

## 2023-10-20 DIAGNOSIS — R5382 Chronic fatigue, unspecified: Secondary | ICD-10-CM

## 2023-10-20 DIAGNOSIS — F411 Generalized anxiety disorder: Secondary | ICD-10-CM | POA: Diagnosis not present

## 2023-10-20 DIAGNOSIS — I10 Essential (primary) hypertension: Secondary | ICD-10-CM | POA: Diagnosis not present

## 2023-10-20 NOTE — Patient Instructions (Addendum)
Keep the diet clean and stay active.  Please consider getting your tetanus booster at the pharmacy.  If you do not hear anything about your referral in the next 1-2 weeks, call our office and ask for an update.  Let us know if you need anything.

## 2023-10-20 NOTE — Progress Notes (Addendum)
Chief Complaint  Patient presents with   Follow-up    Follow up    Subjective Lisa Morrison is a 73 y.o. female who presents for hypertension follow up. She does not monitor home blood pressures. She is compliant with medications Dyazide 37.5-25 mg daily. Patient has these side effects of medication: none She is usually adhering to a healthy diet overall. Current exercise: walking, active around the house No CP or SOB.   GAD Hx of anxiety taking Wellbutrin 150 mg bid. Compliant and no AE's.  No homicidal or suicidal ideation.  No self-medication.  She is not following with a therapist or counselor.  She is also takes nortriptyline 10-20 mg nightly as needed to help with sleep.  Migraines Hx of migraines on Topamax 50 mg bid. Controlled. Compliant with meds, no AE's.   CFS Taking Lyrica 150 mg qhs. Compliant, no AE's. Working well.   Last night she felt a bulge of tissue come out of her vagina while on the toilet. It did not hurt. She was able to push it back up.    Past Medical History:  Diagnosis Date   Anxiety    Arthritis    Breast cancer (HCC)    CFS (chronic fatigue syndrome)    Depression    denies now   Dyspnea    Edema    due to taking lyrica   Family history of adverse reaction to anesthesia    son had n/v   Family history of pancreatic cancer 05/03/2022   Fibromyalgia    IBS (irritable bowel syndrome)    Leaky heart valve    Migraine    Orthostatic hypotension    from a  previous surgery    PONV (postoperative nausea and vomiting)    and heart rate goes down " I fainted after surgery when standing in the restroom."   Seasonal allergies    Spondylolisthesis of lumbar region    Wears glasses     Exam BP 128/74 (BP Location: Left Arm, Patient Position: Sitting)   Pulse 78   Temp 98 F (36.7 C) (Oral)   Resp 16   Ht 5\' 4"  (1.626 m)   Wt 180 lb 12.8 oz (82 kg)   SpO2 98%   BMI 31.03 kg/m  General:  well developed, well nourished, in no apparent  distress Heart: RRR, no bruits, no LE edema Lungs: clear to auscultation, no accessory muscle use Psych: well oriented with normal range of affect and appropriate judgment/insight  Essential hypertension  GAD (generalized anxiety disorder)  Migraine with aura, not intractable, without status migrainosus  Chronic fatigue  Vaginal prolapse - Plan: Ambulatory referral to Obstetrics / Gynecology  Chronic, stable.  Continue Dyazide 37.5-25 mg daily.  Counseled on diet and exercise. Chronic, stable.  Continue Wellbutrin 150 mg twice daily, Pamelor 10-20 mg nightly as needed. Chronic, stable. Cont Topamax 50 mg bid.  Chronic, stable. Cont Lyrica 150 mg/d.  Sounds like a vag prolapse. Will get her in with OB/GYN F/u in 6 months. The patient voiced understanding and agreement to the plan.  Jilda Roche [place], DO 10/20/23  8:20 AM

## 2023-10-26 DIAGNOSIS — L565 Disseminated superficial actinic porokeratosis (DSAP): Secondary | ICD-10-CM | POA: Diagnosis not present

## 2023-10-26 DIAGNOSIS — L814 Other melanin hyperpigmentation: Secondary | ICD-10-CM | POA: Diagnosis not present

## 2023-10-26 DIAGNOSIS — L821 Other seborrheic keratosis: Secondary | ICD-10-CM | POA: Diagnosis not present

## 2023-10-26 DIAGNOSIS — L648 Other androgenic alopecia: Secondary | ICD-10-CM | POA: Diagnosis not present

## 2023-10-26 DIAGNOSIS — Z85828 Personal history of other malignant neoplasm of skin: Secondary | ICD-10-CM | POA: Diagnosis not present

## 2023-10-26 DIAGNOSIS — D225 Melanocytic nevi of trunk: Secondary | ICD-10-CM | POA: Diagnosis not present

## 2023-10-26 DIAGNOSIS — L570 Actinic keratosis: Secondary | ICD-10-CM | POA: Diagnosis not present

## 2023-10-26 DIAGNOSIS — L218 Other seborrheic dermatitis: Secondary | ICD-10-CM | POA: Diagnosis not present

## 2023-10-26 DIAGNOSIS — L579 Skin changes due to chronic exposure to nonionizing radiation, unspecified: Secondary | ICD-10-CM | POA: Diagnosis not present

## 2023-10-30 ENCOUNTER — Other Ambulatory Visit: Payer: Self-pay | Admitting: Family Medicine

## 2023-10-30 DIAGNOSIS — M797 Fibromyalgia: Secondary | ICD-10-CM

## 2023-10-30 DIAGNOSIS — F411 Generalized anxiety disorder: Secondary | ICD-10-CM

## 2023-11-01 ENCOUNTER — Encounter: Payer: Self-pay | Admitting: Family Medicine

## 2023-11-01 NOTE — Telephone Encounter (Signed)
 Please follow up on CPAP order, Thank you!

## 2023-11-01 NOTE — Telephone Encounter (Signed)
 Called Imaging and they are working on getting her in, called pt was advised they should be giving her a call. Pt stated understand.

## 2023-11-02 NOTE — Telephone Encounter (Signed)
 I received a response from Stacy with [facility], [address]   We have called the patient twice, the first time we left a message to call us back, the second time we call twice and both times no answer and unable to leave message so an email was sent to the patient to please call us to schedule. I  just called her and she said her phone was blocking our calls we are getting her on the schedule now. Thank you

## 2023-11-02 NOTE — Telephone Encounter (Signed)
 I sent urgent message to [facility] to check on this issue

## 2023-11-03 DIAGNOSIS — J3081 Allergic rhinitis due to animal (cat) (dog) hair and dander: Secondary | ICD-10-CM | POA: Diagnosis not present

## 2023-11-03 DIAGNOSIS — J3089 Other allergic rhinitis: Secondary | ICD-10-CM | POA: Diagnosis not present

## 2023-11-03 DIAGNOSIS — J301 Allergic rhinitis due to pollen: Secondary | ICD-10-CM | POA: Diagnosis not present

## 2023-11-08 DIAGNOSIS — G4733 Obstructive sleep apnea (adult) (pediatric): Secondary | ICD-10-CM | POA: Diagnosis not present

## 2023-11-10 DIAGNOSIS — J301 Allergic rhinitis due to pollen: Secondary | ICD-10-CM | POA: Diagnosis not present

## 2023-11-10 DIAGNOSIS — J3089 Other allergic rhinitis: Secondary | ICD-10-CM | POA: Diagnosis not present

## 2023-11-10 DIAGNOSIS — J3081 Allergic rhinitis due to animal (cat) (dog) hair and dander: Secondary | ICD-10-CM | POA: Diagnosis not present

## 2023-11-14 DIAGNOSIS — G4733 Obstructive sleep apnea (adult) (pediatric): Secondary | ICD-10-CM

## 2023-11-15 NOTE — Telephone Encounter (Signed)
 Sorry to hear this, can we get download, I can make some adjustments once I see this .

## 2023-11-16 NOTE — Telephone Encounter (Signed)
 Download printed out and placed in Tammy's review folder.  Tammy, please advise.  Thank you.

## 2023-11-18 NOTE — Telephone Encounter (Signed)
 Your download shows that you are doing very well, cut down on the pressure slightly to see if that makes it more comfortable.  It is controlling your sleep apnea very well CPAP download over the last week shows good usage at 6 hours.  On auto CPAP 5 to 15 cm H2O.  AHI 2.2/hour daily average pressure at 11

## 2023-11-24 DIAGNOSIS — J3089 Other allergic rhinitis: Secondary | ICD-10-CM | POA: Diagnosis not present

## 2023-11-24 DIAGNOSIS — J3081 Allergic rhinitis due to animal (cat) (dog) hair and dander: Secondary | ICD-10-CM | POA: Diagnosis not present

## 2023-11-24 DIAGNOSIS — J301 Allergic rhinitis due to pollen: Secondary | ICD-10-CM | POA: Diagnosis not present

## 2023-11-30 ENCOUNTER — Encounter: Payer: Self-pay | Admitting: Adult Health

## 2023-12-02 ENCOUNTER — Other Ambulatory Visit: Payer: Self-pay | Admitting: Family Medicine

## 2023-12-08 DIAGNOSIS — J3089 Other allergic rhinitis: Secondary | ICD-10-CM | POA: Diagnosis not present

## 2023-12-08 DIAGNOSIS — J301 Allergic rhinitis due to pollen: Secondary | ICD-10-CM | POA: Diagnosis not present

## 2023-12-08 DIAGNOSIS — J3081 Allergic rhinitis due to animal (cat) (dog) hair and dander: Secondary | ICD-10-CM | POA: Diagnosis not present

## 2023-12-09 DIAGNOSIS — G4733 Obstructive sleep apnea (adult) (pediatric): Secondary | ICD-10-CM | POA: Diagnosis not present

## 2023-12-10 ENCOUNTER — Telehealth: Payer: Self-pay

## 2023-12-10 NOTE — Telephone Encounter (Signed)
 Please find pt an earlier appointment with Rubye Oaks, NP if able, or place on the cancellation list. Pt stated she needs an earlier appointment in order to meet her insurance requirements for CPAP.

## 2023-12-13 ENCOUNTER — Encounter: Payer: Self-pay | Admitting: Obstetrics and Gynecology

## 2023-12-13 ENCOUNTER — Ambulatory Visit: Payer: Medicare PPO | Admitting: Obstetrics and Gynecology

## 2023-12-13 VITALS — BP 139/60 | HR 78 | Ht 64.0 in | Wt 181.0 lb

## 2023-12-13 DIAGNOSIS — K5900 Constipation, unspecified: Secondary | ICD-10-CM

## 2023-12-13 DIAGNOSIS — R32 Unspecified urinary incontinence: Secondary | ICD-10-CM | POA: Diagnosis not present

## 2023-12-13 DIAGNOSIS — N8110 Cystocele, unspecified: Secondary | ICD-10-CM

## 2023-12-13 DIAGNOSIS — Z79899 Other long term (current) drug therapy: Secondary | ICD-10-CM | POA: Diagnosis not present

## 2023-12-13 DIAGNOSIS — Z853 Personal history of malignant neoplasm of breast: Secondary | ICD-10-CM | POA: Insufficient documentation

## 2023-12-13 NOTE — Progress Notes (Signed)
 NEW GYNECOLOGY VISIT Chief Complaint  Patient presents with   Gynecologic Exam    Patient recently noticed ? Bladder prolapse.       Subjective:  Lisa Morrison is a 73 y.o. who presents for possible prolapse.  She states that she recently noted a golf ball size bulge in her vagina. Denies any pain or discomfort or particular pressure.  Notes some urinary leakage first thing in the morning when she wakes up if she doesn't make it to the bathroom in time. Otherwise notes rare stress or urge incontinence and does not wear a pad during the day. Sometimes has difficulty emptying her bladder. Has some constipation that she tries to combat with medication with good effect. Takes hydrochlorothiazide at night for swelling in her legs. Drinks a nightly sweat tea.  History of total abdominal hysterectomy with one ovary removed at age 20 due to history of endometriosis, subsequently had other ovary removed later on.  Denies history of abnormal pap smears  +history of breast cancer 2 years ago, treated with lumpectomy and radiation, currently on tamoxifen.    OB History     Gravida  2   Para  2   Term      Preterm      AB      Living         SAB      IAB      Ectopic      Multiple      Live Births           Obstetric Comments  Two prior SVDs, 9lbs & 6 lbs         Past Medical History:  Diagnosis Date   Anxiety    Arthritis    Breast cancer (HCC)    CFS (chronic fatigue syndrome)    Depression    denies now   Dyspnea    Edema    due to taking lyrica   Family history of adverse reaction to anesthesia    son had n/v   Family history of pancreatic cancer 05/03/2022   Fibromyalgia    IBS (irritable bowel syndrome)    Leaky heart valve    Migraine    Orthostatic hypotension    from a  previous surgery    PONV (postoperative nausea and vomiting)    and heart rate goes down " I fainted after surgery when standing in the restroom."   Seasonal allergies     Spondylolisthesis of lumbar region    Wears glasses     Past Surgical History:  Procedure Laterality Date   ABDOMINAL HYSTERECTOMY     back ablasion     03/2018 and 05/2018   back stimulator  2022   BACK SURGERY  2015   BREAST LUMPECTOMY WITH RADIOACTIVE SEED AND SENTINEL LYMPH NODE BIOPSY Left 05/22/2022   Procedure: LEFT BREAST LUMPECTOMY WITH RADIOACTIVE SEED AND SENTINEL LYMPH NODE BIOPSY;  Surgeon: Griselda Miner, MD;  Location: [facility];  Service: General;  Laterality: Left;   CHOLECYSTECTOMY     HERNIA REPAIR     x2   OOPHORECTOMY     VARICOSE VEIN SURGERY      Social History   Socioeconomic History   Marital status: Married    Spouse name: Not on file   Number of children: Not on file   Years of education: Not on file   Highest education level: Associate degree: academic program  Occupational History   Not  on file  Tobacco Use   Smoking status: Never    Passive exposure: Past   Smokeless tobacco: Never  Vaping Use   Vaping status: Never Used  Substance and Sexual Activity   Alcohol use: Never   Drug use: Never   Sexual activity: Not Currently  Other Topics Concern   Not on file  Social History Narrative   Not on file   Social Drivers of Health   Financial Resource Strain: Low Risk  (10/19/2023)   Overall Financial Resource Strain (CARDIA)    Difficulty of Paying Living Expenses: Not hard at all  Food Insecurity: No Food Insecurity (10/19/2023)   Hunger Vital Sign    Worried About Running Out of Food in the Last Year: Never true    Ran Out of Food in the Last Year: Never true  Transportation Needs: No Transportation Needs (10/19/2023)   PRAPARE - Administrator, Civil Service (Medical): No    Lack of Transportation (Non-Medical): No  Physical Activity: Insufficiently Active (10/19/2023)   Exercise Vital Sign    Days of Exercise per Week: 2 days    Minutes of Exercise per Session: 30 min  Stress: No Stress Concern Present  (10/19/2023)   [employer] of Occupational Health - Occupational Stress Questionnaire    Feeling of Stress : Only a little  Social Connections: Socially Integrated (10/19/2023)   Social Connection and Isolation Panel [NHANES]    Frequency of Communication with Friends and Family: More than three times a week    Frequency of Social Gatherings with Friends and Family: More than three times a week    Attends Religious Services: 1 to 4 times per year    Active Member of [employer] or Organizations: Yes    Attends Banker Meetings: 1 to 4 times per year    Marital Status: Married    Family History  Problem Relation Age of Onset   Mitral valve prolapse Mother    Coronary aneurysm Father    Diabetes Father    Pancreatic cancer Maternal Uncle 59   Healthy Son     Current Outpatient Medications on File Prior to Visit  Medication Sig Dispense Refill   acetaminophen (TYLENOL) 650 MG CR tablet Take 1,300 mg by mouth 2 (two) times daily as needed.     Budeson-Glycopyrrol-Formoterol (BREZTRI AEROSPHERE) 160-9-4.8 MCG/ACT AERO Inhale 2 puffs into the lungs in the morning and at bedtime. 10.7 g 5   buPROPion (WELLBUTRIN SR) 150 MG 12 hr tablet TAKE 1 TABLET BY MOUTH TWICE A DAY 180 tablet 1   EPIPEN 2-PAK 0.3 MG/0.3ML SOAJ injection      levocetirizine (XYZAL) 5 MG tablet      lubiprostone (AMITIZA) 24 MCG capsule TAKE 1 CAPSULE (24 MCG TOTAL) BY MOUTH DAILY WITH BREAKFAST. 90 capsule 2   meloxicam (MOBIC) 7.5 MG tablet      montelukast (SINGULAIR) 10 MG tablet TAKE 1 TABLET BY MOUTH EVERY DAY IN THE EVENING 90 tablet 2   nortriptyline (PAMELOR) 10 MG capsule TAKE 1-2 CAPSULES (10-20 MG TOTAL) BY MOUTH AT BEDTIME. 180 capsule 2   potassium chloride (MICRO-K) 10 MEQ CR capsule TAKE 2 CAPSULES BY MOUTH EVERY DAY 180 capsule 1   pregabalin (LYRICA) 150 MG capsule TAKE 1 CAPSULE BY MOUTH EVERY DAY AT NIGHT 90 capsule 1   tamoxifen (NOLVADEX) 20 MG tablet TAKE 1 TABLET BY MOUTH EVERY DAY 90  tablet 3   topiramate (TOPAMAX) 50 MG tablet TAKE 1 TABLET  BY MOUTH TWICE A DAY 180 tablet 1   triamterene-hydrochlorothiazide (DYAZIDE) 37.5-25 MG capsule TAKE 1 EACH (1 CAPSULE TOTAL) BY MOUTH DAILY. 90 capsule 1   No current facility-administered medications on file prior to visit.    Allergies  Allergen Reactions   Augmentin [Amoxicillin-Pot Clavulanate] Rash    Did it involve swelling of the face/tongue/throat, SOB, or low BP? Yes Did it involve sudden or severe rash/hives, skin peeling, or any reaction on the inside of your mouth or nose? Yes Did you need to seek medical attention at a hospital or doctor's office? Yes When did it last happen? Over 10 years ago If all above answers are "NO", may proceed with cephalosporin use.   Demerol [Meperidine] Other (See Comments)    Lowers blood pressure    Amoxicillin     Other reaction(s): Loss of consciousness   Clavulanic Acid     Other reaction(s): lowers blood pressure     Objective:   Vitals:   12/13/23 0855  BP: 139/60  Pulse: 78  Weight: 181 lb (82.1 kg)  Height: 5\' 4"  (1.626 m)   Physical Examination:   General appearance - well appearing, and in no distress  Mental status - alert, oriented to person, place, and time  Psych:  normal mood and affect  Skin - warm and dry, normal color, no suspicious lesions noted  Chest - effort normal  Heart - normal rate    Abdomen - soft, nontender, nondistended, no masses or organomegaly  Pelvic -  VULVA: normal appearing vulva with no masses, tenderness or lesions   VAGINA: normal appearing vagina with normal color and discharge, no lesions  Prolapse of anterior vaginal wall to +1 with valsalva CERVIX: surgically absent  UTERUS: surgically absent  ADNEXA: surgically absent  Extremities:  No swelling or varicosities noted  Chaperone present for exam  Assessment and Plan:  Lisa Morrison is a 73 y.o. with anterior vaginal wall prolapse  1. Prolapse of anterior vaginal wall  (Primary) Discussed diagnosis of prolapse in detail with the patient. Reviewed that it is not dangerous or harmful but may cause symptoms such as pressure, bulge sensation, difficulty with voiding, leakage of urine, etc. We reviewed methods for management/treatment including pelvic floor physical therapy, kegel exercises, pessary use, surgical management. She declines intervention at this time but will follow up as needed for new or worsening symptoms.  2. Urinary incontinence, unspecified type Reviewed avoidance of caffeine or alcohol in the evenings that may improve her symptoms. Also reviewed that she could discuss the timing of her diuretic with her PCP to see if that improves her symptoms since she takes the hydrochlorothiazide at night  3. Constipation, unspecified constipation type Reviewed that constipation can contribute/worsen symptoms of prolapse. Encouraged continued efforts to decrease constipation  4. Long term current use of diuretic See above    No follow-ups on file.  Future Appointments  Date Time Provider Department Center  04/13/2024  9:00 AM Parrett, Virgel Bouquet, NP [facility] None  04/21/2024  8:30 AM Sharlene Dory, DO [facility]  04/25/2024  9:15 AM Rachel Moulds, MD [facility] None  05/03/2024  8:30 AM [facility] TOMO 2 [facility]  06/13/2024 10:20 AM [facility] CLINICAL SUPPORT [facility]    Wanita Chamberlain, MD

## 2023-12-22 DIAGNOSIS — J3081 Allergic rhinitis due to animal (cat) (dog) hair and dander: Secondary | ICD-10-CM | POA: Diagnosis not present

## 2023-12-22 DIAGNOSIS — J301 Allergic rhinitis due to pollen: Secondary | ICD-10-CM | POA: Diagnosis not present

## 2023-12-22 DIAGNOSIS — J3089 Other allergic rhinitis: Secondary | ICD-10-CM | POA: Diagnosis not present

## 2023-12-28 DIAGNOSIS — J3089 Other allergic rhinitis: Secondary | ICD-10-CM | POA: Diagnosis not present

## 2023-12-28 DIAGNOSIS — J301 Allergic rhinitis due to pollen: Secondary | ICD-10-CM | POA: Diagnosis not present

## 2023-12-28 DIAGNOSIS — J3081 Allergic rhinitis due to animal (cat) (dog) hair and dander: Secondary | ICD-10-CM | POA: Diagnosis not present

## 2024-01-03 DIAGNOSIS — H5052 Exophoria: Secondary | ICD-10-CM | POA: Diagnosis not present

## 2024-01-03 DIAGNOSIS — Z961 Presence of intraocular lens: Secondary | ICD-10-CM | POA: Diagnosis not present

## 2024-01-03 DIAGNOSIS — H35373 Puckering of macula, bilateral: Secondary | ICD-10-CM | POA: Diagnosis not present

## 2024-01-03 DIAGNOSIS — H26493 Other secondary cataract, bilateral: Secondary | ICD-10-CM | POA: Diagnosis not present

## 2024-01-03 DIAGNOSIS — H02834 Dermatochalasis of left upper eyelid: Secondary | ICD-10-CM | POA: Diagnosis not present

## 2024-01-03 DIAGNOSIS — H40013 Open angle with borderline findings, low risk, bilateral: Secondary | ICD-10-CM | POA: Diagnosis not present

## 2024-01-03 DIAGNOSIS — H04123 Dry eye syndrome of bilateral lacrimal glands: Secondary | ICD-10-CM | POA: Diagnosis not present

## 2024-01-03 DIAGNOSIS — H16223 Keratoconjunctivitis sicca, not specified as Sjogren's, bilateral: Secondary | ICD-10-CM | POA: Diagnosis not present

## 2024-01-03 DIAGNOSIS — H02831 Dermatochalasis of right upper eyelid: Secondary | ICD-10-CM | POA: Diagnosis not present

## 2024-01-05 DIAGNOSIS — J3089 Other allergic rhinitis: Secondary | ICD-10-CM | POA: Diagnosis not present

## 2024-01-05 DIAGNOSIS — J301 Allergic rhinitis due to pollen: Secondary | ICD-10-CM | POA: Diagnosis not present

## 2024-01-05 DIAGNOSIS — J3081 Allergic rhinitis due to animal (cat) (dog) hair and dander: Secondary | ICD-10-CM | POA: Diagnosis not present

## 2024-01-08 DIAGNOSIS — G4733 Obstructive sleep apnea (adult) (pediatric): Secondary | ICD-10-CM | POA: Diagnosis not present

## 2024-01-12 ENCOUNTER — Encounter: Payer: Self-pay | Admitting: Hematology and Oncology

## 2024-01-19 DIAGNOSIS — J301 Allergic rhinitis due to pollen: Secondary | ICD-10-CM | POA: Diagnosis not present

## 2024-01-19 DIAGNOSIS — J3081 Allergic rhinitis due to animal (cat) (dog) hair and dander: Secondary | ICD-10-CM | POA: Diagnosis not present

## 2024-01-19 DIAGNOSIS — J3089 Other allergic rhinitis: Secondary | ICD-10-CM | POA: Diagnosis not present

## 2024-01-22 ENCOUNTER — Other Ambulatory Visit: Payer: Self-pay | Admitting: Family Medicine

## 2024-01-22 DIAGNOSIS — R519 Headache, unspecified: Secondary | ICD-10-CM

## 2024-01-22 DIAGNOSIS — G43109 Migraine with aura, not intractable, without status migrainosus: Secondary | ICD-10-CM

## 2024-01-25 ENCOUNTER — Other Ambulatory Visit: Payer: Self-pay | Admitting: Family Medicine

## 2024-02-02 DIAGNOSIS — J3081 Allergic rhinitis due to animal (cat) (dog) hair and dander: Secondary | ICD-10-CM | POA: Diagnosis not present

## 2024-02-02 DIAGNOSIS — J301 Allergic rhinitis due to pollen: Secondary | ICD-10-CM | POA: Diagnosis not present

## 2024-02-02 DIAGNOSIS — J3089 Other allergic rhinitis: Secondary | ICD-10-CM | POA: Diagnosis not present

## 2024-02-07 ENCOUNTER — Ambulatory Visit: Admitting: Adult Health

## 2024-02-07 ENCOUNTER — Encounter: Payer: Self-pay | Admitting: Adult Health

## 2024-02-07 VITALS — BP 118/75 | HR 69 | Ht 64.0 in | Wt 184.4 lb

## 2024-02-07 DIAGNOSIS — G4733 Obstructive sleep apnea (adult) (pediatric): Secondary | ICD-10-CM | POA: Diagnosis not present

## 2024-02-07 NOTE — Progress Notes (Signed)
 @Patient  ID: Lisa Morrison, female    DOB: [birth year], 73 y.o.   MRN: [id number]  Chief Complaint  Patient presents with   Follow-up    CPap    Referring provider: Jobe Mulder*  HPI: 73 year old female seen for sleep consult December 2024 for snoring and daytime sleepiness found to have mild obstructive sleep apnea  TEST/EVENTS :  home sleep study that was done July 25, 2023. This showed mild sleep apnea with AHI at 7.4/hour and SpO2 low at 79%.   02/07/2024 Follow up : OSA  Patient presents for a 41-month follow-up.  Patient was found to have mild obstructive sleep apnea on home sleep study in November 2024.  Last visit patient decided that she wanted to start on CPAP.  She had significant symptom burden with snoring and daytime sleepiness.  Patient says she is doing better since starting on CPAP.  She feels more rested and less sleepy.  Feels that CPAP is really helping her.  CPAP download shows excellent compliance with daily average usage at 6.5 hours.  She is on auto CPAP 5 to 11 cm H2O.  Daily average pressure at 9.7 cm H2O.  AHI 0.3/hour.  Currently using nasal pillows.  Says that occasionally her CPAP mask will move/leak at nighttime but overall he is doing well.  Her husband also has a CPAP machine.   Allergies  Allergen Reactions   Augmentin [Amoxicillin-Pot Clavulanate] Rash    Did it involve swelling of the face/tongue/throat, SOB, or low BP? Yes Did it involve sudden or severe rash/hives, skin peeling, or any reaction on the inside of your mouth or nose? Yes Did you need to seek medical attention at a hospital or doctor's office? Yes When did it last happen? Over 10 years ago If all above answers are "NO", may proceed with cephalosporin use.   Demerol  [Meperidine ] Other (See Comments)    Lowers blood pressure    Amoxicillin     Other reaction(s): Loss of consciousness   Clavulanic Acid     Other reaction(s): lowers blood pressure    Immunization History   Administered Date(s) Administered   Influenza, High Dose Seasonal PF 07/06/2018, 06/21/2019, 05/08/2020   Influenza,inj,Quad PF,6+ Mos 06/30/2017   Influenza-Unspecified 06/24/2016, 05/08/2021   Janssen (J&J) SARS-COV-2 Vaccination 05/02/2020, 07/03/2020   PNEUMOCOCCAL CONJUGATE-20 11/23/2020   Pneumococcal Conjugate-13 06/30/2017   Zoster Recombinant(Shingrix) 08/16/2018, 11/23/2020   Zoster, Live 09/08/2011    Past Medical History:  Diagnosis Date   Anxiety    Arthritis    Breast cancer (HCC)    CFS (chronic fatigue syndrome)    Depression    denies now   Dyspnea    Edema    due to taking lyrica    Family history of adverse reaction to anesthesia    son had n/v   Family history of pancreatic cancer 05/03/2022   Fibromyalgia    IBS (irritable bowel syndrome)    Leaky heart valve    Migraine    Orthostatic hypotension    from a  previous surgery    PONV (postoperative nausea and vomiting)    and heart rate goes down " I fainted after surgery when standing in the restroom."   Seasonal allergies    Spondylolisthesis of lumbar region    Wears glasses     Tobacco History: Social History   Tobacco Use  Smoking Status Never   Passive exposure: Past  Smokeless Tobacco Never   Counseling given: Not Answered   Outpatient  Medications Prior to Visit  Medication Sig Dispense Refill   acetaminophen  (TYLENOL ) 650 MG CR tablet Take 1,300 mg by mouth 2 (two) times daily as needed.     Budeson-Glycopyrrol-Formoterol (BREZTRI  AEROSPHERE) 160-9-4.8 MCG/ACT AERO Inhale 2 puffs into the lungs in the morning and at bedtime. 10.7 g 5   buPROPion  (WELLBUTRIN  SR) 150 MG 12 hr tablet TAKE 1 TABLET BY MOUTH TWICE A DAY 180 tablet 1   EPIPEN  2-PAK 0.3 MG/0.3ML SOAJ injection      levocetirizine (XYZAL) 5 MG tablet      lubiprostone  (AMITIZA ) 24 MCG capsule TAKE 1 CAPSULE (24 MCG TOTAL) BY MOUTH DAILY WITH BREAKFAST. 90 capsule 2   meloxicam (MOBIC) 7.5 MG tablet      montelukast  (SINGULAIR) 10 MG tablet TAKE 1 TABLET BY MOUTH EVERY DAY IN THE EVENING 90 tablet 2   nortriptyline  (PAMELOR ) 10 MG capsule TAKE 1-2 CAPSULES (10-20 MG TOTAL) BY MOUTH AT BEDTIME. 180 capsule 2   potassium chloride  (MICRO-K ) 10 MEQ CR capsule TAKE 2 CAPSULES BY MOUTH EVERY DAY 180 capsule 1   pregabalin  (LYRICA ) 150 MG capsule TAKE 1 CAPSULE BY MOUTH EVERY DAY AT NIGHT 90 capsule 1   topiramate  (TOPAMAX ) 50 MG tablet TAKE 1 TABLET BY MOUTH EVERY DAY AT NIGHT 90 tablet 1   triamterene -hydrochlorothiazide  (DYAZIDE ) 37.5-25 MG capsule TAKE 1 EACH (1 CAPSULE TOTAL) BY MOUTH DAILY. 90 capsule 1   tamoxifen  (NOLVADEX ) 20 MG tablet TAKE 1 TABLET BY MOUTH EVERY DAY (Patient not taking: Reported on 02/07/2024) 90 tablet 3   No facility-administered medications prior to visit.     Review of Systems:   Constitutional:   No  weight loss, night sweats,  Fevers, chills, fatigue, or  lassitude.  HEENT:   No headaches,  Difficulty swallowing,  Tooth/dental problems, or  Sore throat,                No sneezing, itching, ear ache, nasal congestion, post nasal drip,   CV:  No chest pain,  Orthopnea, PND, swelling in lower extremities, anasarca, dizziness, palpitations, syncope.   GI  No heartburn, indigestion, abdominal pain, nausea, vomiting, diarrhea, change in bowel habits, loss of appetite, bloody stools.   Resp: No shortness of breath with exertion or at rest.  No excess mucus, no productive cough,  No non-productive cough,  No coughing up of blood.  No change in color of mucus.  No wheezing.  No chest wall deformity  Skin: no rash or lesions.  GU: no dysuria, change in color of urine, no urgency or frequency.  No flank pain, no hematuria   MS:  No joint pain or swelling.  No decreased range of motion.  No back pain.    Physical Exam  BP 118/75 (BP Location: Left Arm, Patient Position: Sitting, Cuff Size: Normal)   Pulse 69   Ht 5\' 4"  (1.626 m)   Wt 184 lb 6.4 oz (83.6 kg)   SpO2 99%   BMI  31.65 kg/m   GEN: A/Ox3; pleasant , NAD, well nourished    HEENT:  [place]/AT,  NOSE-clear, THROAT-clear, no lesions, no postnasal drip or exudate noted.   NECK:  Supple w/ fair ROM; no JVD; normal carotid impulses w/o bruits; no thyromegaly or nodules palpated; no lymphadenopathy.    RESP  Clear  P & A; w/o, wheezes/ rales/ or rhonchi. no accessory muscle use, no dullness to percussion  CARD:  RRR, no m/r/g, no peripheral edema, pulses intact, no cyanosis or  clubbing.  GI:   Soft & nt; nml bowel sounds; no organomegaly or masses detected.   Musco: Warm bil, no deformities or joint swelling noted.   Neuro: alert, no focal deficits noted.    Skin: Warm, no lesions or rashes    Lab Results:  CBC    BNP No results found for: "BNP"  ProBNP No results found for: "PROBNP"  Imaging: No results found.  Administration History     None           No data to display          No results found for: "NITRICOXIDE"      Assessment & Plan:   OSA (obstructive sleep apnea) Excellent control compliance on nocturnal CPAP.  Has perceived benefit.  Continue all current settings  Plan  Patient Instructions  Continue on CPAP At bedtime   Keep up good work.  Work on healthy weight loss Do not drive if sleepy  Saline nasal gel At bedtime   Follow up in 6 months and As needed         Marathon [employer], NP 02/07/2024

## 2024-02-07 NOTE — Assessment & Plan Note (Signed)
 Excellent control compliance on nocturnal CPAP.  Has perceived benefit.  Continue all current settings  Plan  Patient Instructions  Continue on CPAP At bedtime   Keep up good work.  Work on healthy weight loss Do not drive if sleepy  Saline nasal gel At bedtime   Follow up in 6 months and As needed

## 2024-02-07 NOTE — Patient Instructions (Addendum)
 Continue on CPAP At bedtime   Keep up good work.  Work on healthy weight loss Do not drive if sleepy  Saline nasal gel At bedtime   Follow up in 6 months and As needed

## 2024-02-08 ENCOUNTER — Encounter: Admitting: Adult Health

## 2024-02-10 DIAGNOSIS — J4540 Moderate persistent asthma, uncomplicated: Secondary | ICD-10-CM | POA: Diagnosis not present

## 2024-02-10 DIAGNOSIS — J301 Allergic rhinitis due to pollen: Secondary | ICD-10-CM | POA: Diagnosis not present

## 2024-02-10 DIAGNOSIS — R0609 Other forms of dyspnea: Secondary | ICD-10-CM | POA: Diagnosis not present

## 2024-02-10 DIAGNOSIS — J3081 Allergic rhinitis due to animal (cat) (dog) hair and dander: Secondary | ICD-10-CM | POA: Diagnosis not present

## 2024-02-10 DIAGNOSIS — J3089 Other allergic rhinitis: Secondary | ICD-10-CM | POA: Diagnosis not present

## 2024-02-10 DIAGNOSIS — W57XXXA Bitten or stung by nonvenomous insect and other nonvenomous arthropods, initial encounter: Secondary | ICD-10-CM | POA: Diagnosis not present

## 2024-02-15 ENCOUNTER — Encounter: Payer: Self-pay | Admitting: Family Medicine

## 2024-03-08 DIAGNOSIS — J3081 Allergic rhinitis due to animal (cat) (dog) hair and dander: Secondary | ICD-10-CM | POA: Diagnosis not present

## 2024-03-08 DIAGNOSIS — J301 Allergic rhinitis due to pollen: Secondary | ICD-10-CM | POA: Diagnosis not present

## 2024-03-08 DIAGNOSIS — J3089 Other allergic rhinitis: Secondary | ICD-10-CM | POA: Diagnosis not present

## 2024-03-09 DIAGNOSIS — G4733 Obstructive sleep apnea (adult) (pediatric): Secondary | ICD-10-CM | POA: Diagnosis not present

## 2024-03-11 ENCOUNTER — Other Ambulatory Visit: Payer: Self-pay | Admitting: Family Medicine

## 2024-03-13 DIAGNOSIS — G4733 Obstructive sleep apnea (adult) (pediatric): Secondary | ICD-10-CM | POA: Diagnosis not present

## 2024-03-13 DIAGNOSIS — F341 Dysthymic disorder: Secondary | ICD-10-CM | POA: Diagnosis not present

## 2024-03-22 DIAGNOSIS — J3089 Other allergic rhinitis: Secondary | ICD-10-CM | POA: Diagnosis not present

## 2024-03-22 DIAGNOSIS — J301 Allergic rhinitis due to pollen: Secondary | ICD-10-CM | POA: Diagnosis not present

## 2024-03-22 DIAGNOSIS — J3081 Allergic rhinitis due to animal (cat) (dog) hair and dander: Secondary | ICD-10-CM | POA: Diagnosis not present

## 2024-03-24 ENCOUNTER — Other Ambulatory Visit: Payer: Self-pay | Admitting: Family Medicine

## 2024-03-24 ENCOUNTER — Encounter: Payer: Self-pay | Admitting: Family Medicine

## 2024-03-27 MED ORDER — TRIAMTERENE-HCTZ 37.5-25 MG PO CAPS
1.0000 | ORAL_CAPSULE | Freq: Every day | ORAL | 1 refills | Status: DC
Start: 2024-03-27 — End: 2024-04-21

## 2024-03-30 ENCOUNTER — Encounter: Payer: Self-pay | Admitting: Obstetrics and Gynecology

## 2024-03-30 DIAGNOSIS — N3000 Acute cystitis without hematuria: Secondary | ICD-10-CM

## 2024-03-31 MED ORDER — NITROFURANTOIN MONOHYD MACRO 100 MG PO CAPS: 100.0000 mg | ORAL_CAPSULE | Freq: Two times a day (BID) | ORAL | 0 refills | Status: AC

## 2024-04-05 DIAGNOSIS — R079 Chest pain, unspecified: Secondary | ICD-10-CM | POA: Diagnosis not present

## 2024-04-05 DIAGNOSIS — R110 Nausea: Secondary | ICD-10-CM | POA: Diagnosis not present

## 2024-04-05 DIAGNOSIS — I2510 Atherosclerotic heart disease of native coronary artery without angina pectoris: Secondary | ICD-10-CM | POA: Diagnosis not present

## 2024-04-05 DIAGNOSIS — R600 Localized edema: Secondary | ICD-10-CM | POA: Diagnosis not present

## 2024-04-05 DIAGNOSIS — E876 Hypokalemia: Secondary | ICD-10-CM | POA: Diagnosis not present

## 2024-04-05 DIAGNOSIS — J3089 Other allergic rhinitis: Secondary | ICD-10-CM | POA: Diagnosis not present

## 2024-04-05 DIAGNOSIS — R0789 Other chest pain: Secondary | ICD-10-CM | POA: Diagnosis not present

## 2024-04-05 DIAGNOSIS — F419 Anxiety disorder, unspecified: Secondary | ICD-10-CM | POA: Diagnosis not present

## 2024-04-05 DIAGNOSIS — K581 Irritable bowel syndrome with constipation: Secondary | ICD-10-CM | POA: Diagnosis not present

## 2024-04-05 DIAGNOSIS — I213 ST elevation (STEMI) myocardial infarction of unspecified site: Secondary | ICD-10-CM | POA: Diagnosis not present

## 2024-04-05 DIAGNOSIS — R609 Edema, unspecified: Secondary | ICD-10-CM | POA: Diagnosis not present

## 2024-04-05 DIAGNOSIS — R112 Nausea with vomiting, unspecified: Secondary | ICD-10-CM | POA: Diagnosis not present

## 2024-04-05 DIAGNOSIS — J301 Allergic rhinitis due to pollen: Secondary | ICD-10-CM | POA: Diagnosis not present

## 2024-04-05 DIAGNOSIS — M797 Fibromyalgia: Secondary | ICD-10-CM | POA: Diagnosis not present

## 2024-04-05 DIAGNOSIS — R531 Weakness: Secondary | ICD-10-CM | POA: Diagnosis not present

## 2024-04-05 DIAGNOSIS — G8929 Other chronic pain: Secondary | ICD-10-CM | POA: Diagnosis not present

## 2024-04-05 DIAGNOSIS — R9431 Abnormal electrocardiogram [ECG] [EKG]: Secondary | ICD-10-CM | POA: Diagnosis not present

## 2024-04-05 DIAGNOSIS — K5909 Other constipation: Secondary | ICD-10-CM | POA: Diagnosis not present

## 2024-04-05 DIAGNOSIS — R55 Syncope and collapse: Secondary | ICD-10-CM | POA: Diagnosis not present

## 2024-04-05 DIAGNOSIS — I358 Other nonrheumatic aortic valve disorders: Secondary | ICD-10-CM | POA: Diagnosis not present

## 2024-04-05 DIAGNOSIS — E860 Dehydration: Secondary | ICD-10-CM | POA: Diagnosis not present

## 2024-04-05 DIAGNOSIS — G43909 Migraine, unspecified, not intractable, without status migrainosus: Secondary | ICD-10-CM | POA: Diagnosis not present

## 2024-04-05 DIAGNOSIS — J3081 Allergic rhinitis due to animal (cat) (dog) hair and dander: Secondary | ICD-10-CM | POA: Diagnosis not present

## 2024-04-09 DIAGNOSIS — G4733 Obstructive sleep apnea (adult) (pediatric): Secondary | ICD-10-CM | POA: Diagnosis not present

## 2024-04-10 ENCOUNTER — Telehealth: Payer: Self-pay

## 2024-04-10 NOTE — Transitions of Care (Post Inpatient/ED Visit) (Signed)
 04/10/2024  Name: ANAISE STERBENZ MRN: [id number] DOB: 18-Oct-1950  Today's TOC FU Call Status: Today's TOC FU Call Status:: Successful TOC FU Call Completed TOC FU Call Complete Date: 04/07/24 Patient's Name and Date of Birth confirmed.  Transition Care Management Follow-up Telephone Call Date of Discharge: 04/07/24 Discharge Facility: Other ([facility]) Name of Other ([facility]) [facility]charge Facility: [facility] Type of Discharge: Inpatient Admission Primary Inpatient Discharge Diagnosis:: STEMI How have you been since you were released from the hospital?: Better Any questions or concerns?: No  Items Reviewed: Did you receive and understand the discharge instructions provided?: Yes Medications obtained,verified, and reconciled?: Yes (Medications Reviewed) Any new allergies since your discharge?: No Dietary orders reviewed?: Yes Type of Diet Ordered:: Low Sodium Heart Healthy Do you have support at home?: Yes People in Home [RPT]: spouse Name of Support/Comfort Primary Source: Layani Foronda  Medications Reviewed Today: Medications Reviewed Today     Reviewed by Moises Reusing, RN (Case Manager) on 04/10/24 at 1050  Med List Status: <None>   Medication Order Taking? Sig Documenting Provider Last Dose Status Informant  acetaminophen  (TYLENOL ) 650 MG CR tablet [id number] Yes Take 1,300 mg by mouth 2 (two) times daily as needed. [provider]  Active Self  Budeson-Glycopyrrol-Formoterol (BREZTRI  AEROSPHERE) 160-9-4.8 MCG/ACT AERO [id number] Yes Inhale 2 puffs into the lungs in the morning and at bedtime. Frann Mabel Mt, DO  Active   buPROPion  (WELLBUTRIN  SR) 150 MG 12 hr tablet [id number] Yes TAKE 1 TABLET BY MOUTH TWICE A DAY Frann Mabel Mt, DO  Active   EPIPEN  2-PAK 0.3 MG/0.3ML SOAJ injection [id number] Yes  [provider]  Active            Med Note (CANTER, KAYLYN D   Fri Sep 25, 2022 10:47 AM) PRN  levocetirizine (XYZAL) 5 MG tablet [id number]  Yes  [provider]  Active   lubiprostone  (AMITIZA ) 24 MCG capsule [id number] Yes TAKE 1 CAPSULE (24 MCG TOTAL) BY MOUTH DAILY WITH BREAKFAST. Frann Mabel Mt, DO  Active   meloxicam (MOBIC) 7.5 MG tablet [id number] Yes  [provider]  Active   montelukast (SINGULAIR) 10 MG tablet [id number] Yes TAKE 1 TABLET BY MOUTH EVERY DAY IN THE EVENING Wendling, Mabel Mt, DO  Active   nortriptyline  (PAMELOR ) 10 MG capsule [id number] Yes TAKE 1-2 CAPSULES (10-20 MG TOTAL) BY MOUTH AT BEDTIME. Frann Mabel Mt, DO  Active   potassium chloride  (MICRO-K ) 10 MEQ CR capsule [id number] Yes TAKE 2 CAPSULES BY MOUTH EVERY DAY Frann Mabel Mt, DO  Active   pregabalin  (LYRICA ) 150 MG capsule [id number] Yes TAKE 1 CAPSULE BY MOUTH EVERY NIGHT Frann Mabel Mt, DO  Active   topiramate  (TOPAMAX ) 50 MG tablet [id number] Yes TAKE 1 TABLET BY MOUTH EVERY DAY AT NIGHT Wendling, Mabel Mt, DO  Active   triamterene -hydrochlorothiazide  (DYAZIDE ) 37.5-25 MG capsule [id number] Yes Take 1 each (1 capsule total) by mouth daily. Frann Mabel Mt, DO  Active             Home Care and Equipment/Supplies: Were Home Health Services Ordered?: NA Any new equipment or medical supplies ordered?: NA  Functional Questionnaire: Do you need assistance with bathing/showering or dressing?: No Do you need assistance with meal preparation?: No Do you need assistance with eating?: No Do you have difficulty maintaining continence: No Do you need assistance with getting out of bed/getting out of a chair/moving?: No Do you have difficulty managing or taking your medications?: No  Follow up appointments reviewed:  PCP Follow-up appointment confirmed?: Yes Date of PCP follow-up appointment?: 04/21/24 Follow-up Provider: Mabel Frann Driscilla Lionel Follow-up appointment confirmed?: No Reason Specialist Follow-Up Not Confirmed: Patient has Specialist Provider Number and will  Call for Appointment Do you need transportation to your follow-up appointment?: No Do you understand care options if your condition(s) worsen?: Yes-patient verbalized understanding  SDOH Interventions Today    Flowsheet Row Most Recent Value  SDOH Interventions   Food Insecurity Interventions Intervention Not Indicated  Housing Interventions Intervention Not Indicated  Transportation Interventions Intervention Not Indicated  Utilities Interventions Intervention Not Indicated    Medford Balboa, BSN, RN   [facility] - [facility] RN Care Manager [phone]

## 2024-04-13 ENCOUNTER — Ambulatory Visit: Payer: Medicare PPO | Admitting: Adult Health

## 2024-04-13 ENCOUNTER — Telehealth: Payer: Self-pay | Admitting: Family Medicine

## 2024-04-13 NOTE — Telephone Encounter (Signed)
 Copied from CRM [id number]. Topic: Appointments - Scheduling Inquiry for Clinic >> Apr 13, 2024 12:34 PM Jasmin G wrote: Reason for CRM: Pt called due to having 2 upcoming appts, one on the 12th at 11:15 a.m for a hospital follow up, and one on the 15th at 8:30 a.m for a Physical, she wanted to see if it would be possible to get those done on the same appt on the 15th. She also wanted to know if it will be necessary to fast for hospital follow up appt since she got a lot of blood work done at the hospital. Please message her through MyChart to let her know.

## 2024-04-13 NOTE — Telephone Encounter (Signed)
 Okay to have HFU and CPE the same day ?

## 2024-04-13 NOTE — Telephone Encounter (Signed)
 Can probably just combine. Be fasting as I'd likely get her physical labs that day. Thx.

## 2024-04-14 NOTE — Telephone Encounter (Signed)
 Called pt was advised and 8/1 appt canceled. We will do both together.

## 2024-04-18 ENCOUNTER — Inpatient Hospital Stay: Admitting: Family Medicine

## 2024-04-19 DIAGNOSIS — J3081 Allergic rhinitis due to animal (cat) (dog) hair and dander: Secondary | ICD-10-CM | POA: Diagnosis not present

## 2024-04-19 DIAGNOSIS — J3089 Other allergic rhinitis: Secondary | ICD-10-CM | POA: Diagnosis not present

## 2024-04-19 DIAGNOSIS — J301 Allergic rhinitis due to pollen: Secondary | ICD-10-CM | POA: Diagnosis not present

## 2024-04-21 ENCOUNTER — Other Ambulatory Visit (HOSPITAL_COMMUNITY): Payer: Self-pay

## 2024-04-21 ENCOUNTER — Telehealth: Payer: Self-pay

## 2024-04-21 ENCOUNTER — Ambulatory Visit: Payer: Self-pay | Admitting: Family Medicine

## 2024-04-21 ENCOUNTER — Ambulatory Visit (INDEPENDENT_AMBULATORY_CARE_PROVIDER_SITE_OTHER): Payer: Medicare PPO | Admitting: Family Medicine

## 2024-04-21 ENCOUNTER — Encounter: Payer: Self-pay | Admitting: Family Medicine

## 2024-04-21 VITALS — BP 110/70 | HR 88 | Temp 98.0°F | Resp 18 | Ht 64.0 in | Wt 182.4 lb

## 2024-04-21 DIAGNOSIS — E2839 Other primary ovarian failure: Secondary | ICD-10-CM

## 2024-04-21 DIAGNOSIS — Z0000 Encounter for general adult medical examination without abnormal findings: Secondary | ICD-10-CM

## 2024-04-21 DIAGNOSIS — K5909 Other constipation: Secondary | ICD-10-CM | POA: Diagnosis not present

## 2024-04-21 DIAGNOSIS — I10 Essential (primary) hypertension: Secondary | ICD-10-CM | POA: Diagnosis not present

## 2024-04-21 LAB — COMPREHENSIVE METABOLIC PANEL WITH GFR
ALT: 15 U/L (ref 0–35)
AST: 18 U/L (ref 0–37)
Albumin: 4.3 g/dL (ref 3.5–5.2)
Alkaline Phosphatase: 114 U/L (ref 39–117)
BUN: 17 mg/dL (ref 6–23)
CO2: 32 meq/L (ref 19–32)
Calcium: 9.6 mg/dL (ref 8.4–10.5)
Chloride: 104 meq/L (ref 96–112)
Creatinine, Ser: 0.85 mg/dL (ref 0.40–1.20)
GFR: 68.76 mL/min (ref >60.00)
Glucose, Bld: 98 mg/dL (ref 70–99)
Potassium: 4.4 meq/L (ref 3.5–5.1)
Sodium: 144 meq/L (ref 135–145)
Total Bilirubin: 0.4 mg/dL (ref 0.2–1.2)
Total Protein: 7.2 g/dL (ref 6.0–8.3)

## 2024-04-21 LAB — CBC
HCT: 43.8 % (ref 36.0–46.0)
Hemoglobin: 14.5 g/dL (ref 12.0–15.0)
MCHC: 33 g/dL (ref 30.0–36.0)
MCV: 94.4 fl (ref 78.0–100.0)
Platelets: 269 K/uL (ref 150.0–400.0)
RBC: 4.64 Mil/uL (ref 3.87–5.11)
RDW: 13.5 % (ref 11.5–15.5)
WBC: 5.1 K/uL (ref 4.0–10.5)

## 2024-04-21 LAB — LIPID PANEL
Cholesterol: 238 mg/dL — ABNORMAL HIGH (ref 0–200)
HDL: 79.6 mg/dL (ref >39.00)
LDL Cholesterol: 135 mg/dL — ABNORMAL HIGH (ref 0–99)
NonHDL: 157.9
Total CHOL/HDL Ratio: 3
Triglycerides: 115 mg/dL (ref 0.0–149.0)
VLDL: 23 mg/dL (ref 0.0–40.0)

## 2024-04-21 MED ORDER — TRULANCE 3 MG PO TABS: 1.0000 | ORAL_TABLET | Freq: Every day | ORAL | 2 refills | Status: DC

## 2024-04-21 NOTE — Telephone Encounter (Signed)
 Pharmacy Patient Advocate Encounter   Received notification from CoverMyMeds that prior authorization for Trulance  3MG  tablets is required/requested.   Insurance verification completed.   The patient is insured through [place] .   Per test claim: PA required; PA submitted to above mentioned insurance via Latent Key/confirmation #/EOC [id number] Status is pending

## 2024-04-21 NOTE — Patient Instructions (Addendum)
 Give us  2-3 business days to get the results of your labs back.   Keep the diet clean and stay active.  Stop your blood pressure medicine (Dyazide ) and monitor your blood pressure at home.  Bring up the vagus nerve situation with Dr. Joshua.   Please get me a copy of your advanced directive form at your convenience.   Let me know if there are cost issues with the new medicine for bowel movements.   Let us  know if you need anything.

## 2024-04-21 NOTE — Progress Notes (Signed)
 Chief Complaint  Patient presents with   Annual Exam     Well Woman Lisa Morrison is here for a complete physical.   Her last physical was >1 year ago.  Current diet: in general, a healthy diet. Current exercise: walking. Weight is stable and she denies new daytime fatigue. Seatbelt? Yes Advanced directive? No  Health Maintenance Colonoscopy- Yes Shingrix- Yes DEXA- Due Mammogram- Yes Tetanus- Due Pneumonia- Yes Hep C screen- Yes  Blood pressure Does not monitor BP at home. Diet/exercise as above. Taking Dyazide  37.5-25 mg/d. No new CP or SOB. Getting lightheadedness and tingling in feet intermittently. This has been going on for the past 8-9 mo. she actually got at the hospital at [facility] a few weeks ago for syncopal episode.  She just finished up a long-term heart monitor and is awaiting the results of that.  Catheterization did not reveal any stenosis/blockage.  Patient has a history of chronic constipation.  She is currently taking Amitiza  24 mcg daily.  When she was on twice daily dosing, she had loose bowel movements so it was reduced.  The current single day dosing is not potent enough.  She failed Linzess.  She is wondering if there is anything else she can take.  Past Medical History:  Diagnosis Date   Anxiety    Arthritis    Breast cancer (HCC)    CFS (chronic fatigue syndrome)    Depression    denies now   Dyspnea    Edema    due to taking lyrica    Family history of adverse reaction to anesthesia    son had n/v   Family history of pancreatic cancer 05/03/2022   Fibromyalgia    IBS (irritable bowel syndrome)    Leaky heart valve    Migraine    Orthostatic hypotension    from a  previous surgery    PONV (postoperative nausea and vomiting)    and heart rate goes down  I fainted after surgery when standing in the restroom.   Seasonal allergies    Spondylolisthesis of lumbar region    Wears glasses      Past Surgical History:  Procedure Laterality  Date   ABDOMINAL HYSTERECTOMY     back ablasion     03/2018 and 05/2018   back stimulator  2022   BACK SURGERY  2015   BREAST LUMPECTOMY WITH RADIOACTIVE SEED AND SENTINEL LYMPH NODE BIOPSY Left 05/22/2022   Procedure: LEFT BREAST LUMPECTOMY WITH RADIOACTIVE SEED AND SENTINEL LYMPH NODE BIOPSY;  Surgeon: Curvin Deward MOULD, MD;  Location: [facility];  Service: General;  Laterality: Left;   CHOLECYSTECTOMY     HERNIA REPAIR     x2   OOPHORECTOMY     VARICOSE VEIN SURGERY      Medications  Current Outpatient Medications on File Prior to Visit  Medication Sig Dispense Refill   acetaminophen  (TYLENOL ) 650 MG CR tablet Take 1,300 mg by mouth 2 (two) times daily as needed.     Budeson-Glycopyrrol-Formoterol (BREZTRI  AEROSPHERE) 160-9-4.8 MCG/ACT AERO Inhale 2 puffs into the lungs in the morning and at bedtime. 10.7 g 5   buPROPion  (WELLBUTRIN  SR) 150 MG 12 hr tablet TAKE 1 TABLET BY MOUTH TWICE A DAY 180 tablet 1   EPIPEN  2-PAK 0.3 MG/0.3ML SOAJ injection      levocetirizine (XYZAL) 5 MG tablet      meloxicam (MOBIC) 7.5 MG tablet      montelukast (SINGULAIR) 10 MG tablet TAKE 1  TABLET BY MOUTH EVERY DAY IN THE EVENING 90 tablet 2   nortriptyline  (PAMELOR ) 10 MG capsule TAKE 1-2 CAPSULES (10-20 MG TOTAL) BY MOUTH AT BEDTIME. 180 capsule 2   potassium chloride  (MICRO-K ) 10 MEQ CR capsule TAKE 2 CAPSULES BY MOUTH EVERY DAY 180 capsule 1   pregabalin  (LYRICA ) 150 MG capsule TAKE 1 CAPSULE BY MOUTH EVERY NIGHT 90 capsule 1   topiramate  (TOPAMAX ) 50 MG tablet TAKE 1 TABLET BY MOUTH EVERY DAY AT NIGHT 90 tablet 1    Allergies Allergies  Allergen Reactions   Augmentin [Amoxicillin-Pot Clavulanate] Rash    Did it involve swelling of the face/tongue/throat, SOB, or low BP? Yes Did it involve sudden or severe rash/hives, skin peeling, or any reaction on the inside of your mouth or nose? Yes Did you need to seek medical attention at a hospital or doctor's office? Yes When did it last  happen? Over 10 years ago If all above answers are NO, may proceed with cephalosporin use.   Demerol  [Meperidine ] Other (See Comments)    Lowers blood pressure    Amoxicillin     Other reaction(s): Loss of consciousness   Clavulanic Acid     Other reaction(s): lowers blood pressure    Review of Systems: Constitutional:  no fevers Eye:  no recent significant change in vision Ears:  No changes in hearing Nose/Mouth/Throat:  no complaints of nasal congestion, no sore throat Cardiovascular: no chest pain Respiratory:  No shortness of breath Gastrointestinal:  No diarrhea GU:  Female: negative for dysuria Integumentary:  no abnormal skin lesions reported Neurologic:  no headaches Endocrine:  denies unexplained weight changes  Exam BP 110/70   Pulse 88   Temp 98 F (36.7 C)   Resp 18   Ht 5' 4 (1.626 m)   Wt 182 lb 6.4 oz (82.7 kg)   SpO2 95%   BMI 31.31 kg/m  General:  well developed, well nourished, in no apparent distress Skin:  no significant moles, warts, or growths Head:  no masses, lesions, or tenderness Eyes:  pupils equal and round, sclera anicteric without injection Ears:  canals without lesions, TMs shiny without retraction, no obvious effusion, no erythema Nose:  nares patent, mucosa normal, and no drainage Throat/Pharynx:  lips and gingiva without lesion; tongue and uvula midline; non-inflamed pharynx; no exudates or postnasal drainage, MMD Neck: neck supple without adenopathy, thyromegaly, or masses Lungs:  clear to auscultation, breath sounds equal bilaterally, no respiratory distress Cardio:  regular rate and rhythm, no bruits or LE edema Abdomen:  abdomen soft, nontender; bowel sounds normal; no masses or organomegaly Genital: Deferred Neuro:  gait normal; deep tendon reflexes normal and symmetric Psych: well oriented with normal range of affect and appropriate judgment/insight  Assessment and Plan  Well adult exam  Essential hypertension - Plan:  CBC, Comprehensive metabolic panel with GFR, Lipid panel  Chronic constipation - Plan: Plecanatide  (TRULANCE ) 3 MG TABS  Estrogen deficiency - Plan: DG Bone Density   Well 73 y.o. female. Counseled on diet and exercise. Vagus nerve dysfunction- she will reach out to Dr. Joshua and we will see if [facility] can help with this potential dx.  Chronic constipation: Chronic, not controlled.  Change Amitiza  to Trulance  3 mg daily.  She will let me know if there are cost issues.  She has failed Linzess.  She needs to stay hydrated though. Hypertension: I wonder if her blood pressure is dropping during the day.  She will stop her blood pressure medicine for  now.  I wonder to monitor her blood pressure at home, particularly when she gets the lightheaded sensation. She is due for bone density scanning again.  We will order this and hopefully it can be done at the breast center when she has her mammogram. [facility]  We will check [facility] and update her chart. Other orders as above. Follow up in 6 mo. The patient voiced understanding and agreement to the plan.  Mabel Mt Edison, DO 04/21/24 12:01 PM

## 2024-04-24 NOTE — Telephone Encounter (Signed)
 Message sent to pt.

## 2024-04-24 NOTE — Telephone Encounter (Signed)
 Pharmacy Patient Advocate Encounter  Received notification from [employer] that Prior Authorization for Trulance  3MG  tablets has been APPROVED from 09/08/23 to 09/06/24   PA #/Case ID/Reference #: [id number]

## 2024-04-25 ENCOUNTER — Inpatient Hospital Stay: Payer: Medicare PPO | Attending: Hematology and Oncology | Admitting: Hematology and Oncology

## 2024-04-25 VITALS — BP 144/71 | HR 77 | Temp 98.1°F | Resp 18 | Ht 64.0 in | Wt 188.6 lb

## 2024-04-25 DIAGNOSIS — C50212 Malignant neoplasm of upper-inner quadrant of left female breast: Secondary | ICD-10-CM | POA: Insufficient documentation

## 2024-04-25 DIAGNOSIS — R55 Syncope and collapse: Secondary | ICD-10-CM | POA: Insufficient documentation

## 2024-04-25 DIAGNOSIS — Z17411 Hormone receptor positive with human epidermal growth factor receptor 2 negative status: Secondary | ICD-10-CM | POA: Insufficient documentation

## 2024-04-25 DIAGNOSIS — Z170 Estrogen receptor positive status [ER+]: Secondary | ICD-10-CM | POA: Diagnosis not present

## 2024-04-25 NOTE — Progress Notes (Signed)
 BRIEF ONCOLOGIC HISTORY:  Oncology History  Malignant neoplasm of upper-inner quadrant of left breast in female, estrogen receptor positive (HCC)  04/08/2022 Mammogram   Screening mammogram shows distortion in the left breast warranting further evaluation.  No findings suspicious for malignancy in the right breast.  Left breast diagnostic mammogram confirmed a 6 mm mass in the 11 o'clock position of the left breast with imaging features highly suspicious for malignancy.  Ultrasound of the left axilla demonstrated normal-appearing left axillary lymph nodes    Pathology Results   Pathology from the left breast biopsy showed low-grade invasive well-differentiated ductal adenocarcinoma along with low-grade DCIS showing ER 100% positive strong staining PR 100% positive strong staining, Ki-67 of less than 1% and HER2 negative 1+ by IHC   05/22/2022 Surgery   She had left breast lumpectomy which showed 0.7 cm grade 1 invasive ductal carcinoma, low-grade DCIS, all margins negative for invasive carcinoma, all lymph nodes negative for metastatic carcinoma.     05/2022 -  Anti-estrogen oral therapy   Tamoxifen    06/24/2022 - 07/21/2022 Radiation Therapy   Site Technique Total Dose (Gy) Dose per Fx (Gy) Completed Fx Beam Energies  Breast, Left: Breast_L 3D 40.05/40.05 2.67 15/15 6XFFF, 10XFFF  Breast, Left: Breast_L_Bst specialPort 10/10 2 5/5 12E       INTERVAL HISTORY:   Discussed the use of AI scribe software for clinical note transcription with the patient, who gave verbal consent to proceed.  History of Present Illness Lisa Morrison is a 73 year old female with chronic fatigue syndrome and early-stage estrogen-positive breast cancer who presents with concerns about medication side effects and recent syncope.  Two weeks ago, she experienced a syncopal episode while at [employer] with her family. Prior to passing out, she felt significant chest pressure. She was hospitalized for three days,  during which a heart catheterization was performed, revealing no heart disease. A heart monitor was used, and no signs of atrial fibrillation were found.  Since discontinuing her fluid pill (HCTC with potassium), her weight increased from 182 to 188 pounds, and she feels she has retained fluid, particularly in her legs. She has been on HCTC for many years due to fluid retention caused by Lyrica , which she takes for chronic fatigue syndrome. She feels better off the water pill but notices fluid retention in her legs and rings.  She has a history of early-stage estrogen-positive breast cancer and was on tamoxifen  for two and a half years. She discontinued it in April or May due to significant swelling in her legs, which resolved after stopping the medication. She is concerned about the risk of cancer recurrence but is hesitant to restart tamoxifen  due to the previous side effects.  She continues to take Lyrica  for chronic fatigue syndrome and experiences significant joint pain when not on the medication. She also takes nortriptyline , which was mentioned as a potential contributing factor to her syncope along with outdoor work and heat exposure.  She is active, often working out in the yard, and has a history of significant weight fluctuations. Notes swelling in her legs and a history of syncope.    REVIEW OF SYSTEMS:  Review of Systems  Constitutional:  Negative for appetite change, chills, fatigue, fever and unexpected weight change.  HENT:   Negative for hearing loss, lump/mass and trouble swallowing.   Eyes:  Negative for eye problems and icterus.  Respiratory:  Negative for chest tightness, cough and shortness of breath.   Cardiovascular:  Negative for chest  pain, leg swelling and palpitations.  Gastrointestinal:  Negative for abdominal distention, abdominal pain, constipation, diarrhea, nausea and vomiting.  Endocrine: Negative for hot flashes.  Genitourinary:  Negative for difficulty  urinating.   Musculoskeletal:  Negative for arthralgias.  Skin:  Negative for itching and rash.  Neurological:  Negative for dizziness, extremity weakness, headaches and numbness.  Hematological:  Negative for adenopathy. Does not bruise/bleed easily.  Psychiatric/Behavioral:  Negative for depression. The patient is not nervous/anxious.   Breast: Denies any new nodularity, masses, tenderness, nipple changes, or nipple discharge.   PAST MEDICAL/SURGICAL HISTORY:  Past Medical History:  Diagnosis Date   Anxiety    Arthritis    Breast cancer (HCC)    CFS (chronic fatigue syndrome)    Depression    denies now   Dyspnea    Edema    due to taking lyrica    Family history of adverse reaction to anesthesia    son had n/v   Family history of pancreatic cancer 05/03/2022   Fibromyalgia    IBS (irritable bowel syndrome)    Leaky heart valve    Migraine    Orthostatic hypotension    from a  previous surgery    PONV (postoperative nausea and vomiting)    and heart rate goes down  I fainted after surgery when standing in the restroom.   Seasonal allergies    Spondylolisthesis of lumbar region    Wears glasses    Past Surgical History:  Procedure Laterality Date   ABDOMINAL HYSTERECTOMY     back ablasion     03/2018 and 05/2018   back stimulator  2022   BACK SURGERY  2015   BREAST LUMPECTOMY WITH RADIOACTIVE SEED AND SENTINEL LYMPH NODE BIOPSY Left 05/22/2022   Procedure: LEFT BREAST LUMPECTOMY WITH RADIOACTIVE SEED AND SENTINEL LYMPH NODE BIOPSY;  Surgeon: Curvin Deward MOULD, MD;  Location: [facility];  Service: General;  Laterality: Left;   CHOLECYSTECTOMY     HERNIA REPAIR     x2   OOPHORECTOMY     VARICOSE VEIN SURGERY       ALLERGIES:  Allergies  Allergen Reactions   Augmentin [Amoxicillin-Pot Clavulanate] Rash    Did it involve swelling of the face/tongue/throat, SOB, or low BP? Yes Did it involve sudden or severe rash/hives, skin peeling, or any reaction on  the inside of your mouth or nose? Yes Did you need to seek medical attention at a hospital or doctor's office? Yes When did it last happen? Over 10 years ago If all above answers are NO, may proceed with cephalosporin use.   Demerol  [Meperidine ] Other (See Comments)    Lowers blood pressure    Amoxicillin     Other reaction(s): Loss of consciousness   Clavulanic Acid     Other reaction(s): lowers blood pressure     CURRENT MEDICATIONS:  Outpatient Encounter Medications as of 04/25/2024  Medication Sig Note   acetaminophen  (TYLENOL ) 650 MG CR tablet Take 1,300 mg by mouth 2 (two) times daily as needed.    albuterol  (VENTOLIN  HFA) 108 (90 Base) MCG/ACT inhaler Inhale 1 puff into the lungs every 4 (four) hours as needed.    Azelastine HCl 137 MCG/SPRAY SOLN Place 1 spray into the nose at bedtime.    Budeson-Glycopyrrol-Formoterol (BREZTRI  AEROSPHERE) 160-9-4.8 MCG/ACT AERO Inhale 2 puffs into the lungs in the morning and at bedtime.    buPROPion  (WELLBUTRIN  SR) 150 MG 12 hr tablet TAKE 1 TABLET BY MOUTH TWICE A DAY  levocetirizine (XYZAL) 5 MG tablet     meloxicam (MOBIC) 7.5 MG tablet     montelukast (SINGULAIR) 10 MG tablet TAKE 1 TABLET BY MOUTH EVERY DAY IN THE EVENING    nortriptyline  (PAMELOR ) 10 MG capsule TAKE 1-2 CAPSULES (10-20 MG TOTAL) BY MOUTH AT BEDTIME.    potassium chloride  (MICRO-K ) 10 MEQ CR capsule TAKE 2 CAPSULES BY MOUTH EVERY DAY    pregabalin  (LYRICA ) 150 MG capsule TAKE 1 CAPSULE BY MOUTH EVERY NIGHT    topiramate  (TOPAMAX ) 50 MG tablet TAKE 1 TABLET BY MOUTH EVERY DAY AT NIGHT    Biotin (BIOTIN MAXIMUM STRENGTH) 10 MG TABS Take 30 mg by mouth every morning.    EPIPEN  2-PAK 0.3 MG/0.3ML SOAJ injection  (Patient not taking: Reported on 04/25/2024) 09/25/2022: PRN   Plecanatide  (TRULANCE ) 3 MG TABS Take 1 tablet (3 mg total) by mouth daily. (Patient not taking: Reported on 04/25/2024)    No facility-administered encounter medications on file as of 04/25/2024.      ONCOLOGIC FAMILY HISTORY:  Family History  Problem Relation Age of Onset   Mitral valve prolapse Mother    Coronary aneurysm Father    Diabetes Father    Pancreatic cancer Maternal Uncle 71   Healthy Son       SOCIAL HISTORY:  Social History   Socioeconomic History   Marital status: Married    Spouse name: Not on file   Number of children: Not on file   Years of education: Not on file   Highest education level: Associate degree: academic program  Occupational History   Not on file  Tobacco Use   Smoking status: Never    Passive exposure: Past   Smokeless tobacco: Never  Vaping Use   Vaping status: Never Used  Substance and Sexual Activity   Alcohol use: Never   Drug use: Never   Sexual activity: Not Currently  Other Topics Concern   Not on file  Social History Narrative   Not on file   Social Drivers of Health   Financial Resource Strain: Low Risk  (04/20/2024)   Overall Financial Resource Strain (CARDIA)    Difficulty of Paying Living Expenses: Not hard at all  Food Insecurity: No Food Insecurity (04/20/2024)   Hunger Vital Sign    Worried About Running Out of Food in the Last Year: Never true    Ran Out of Food in the Last Year: Never true  Transportation Needs: No Transportation Needs (04/20/2024)   PRAPARE - Administrator, Civil Service (Medical): No    Lack of Transportation (Non-Medical): No  Physical Activity: Inactive (04/20/2024)   Exercise Vital Sign    Days of Exercise per Week: 0 days    Minutes of Exercise per Session: Not on file  Stress: No Stress Concern Present (04/20/2024)   [employer] of Occupational Health - Occupational Stress Questionnaire    Feeling of Stress: Only a little  Social Connections: Socially Integrated (04/20/2024)   Social Connection and Isolation Panel    Frequency of Communication with Friends and Family: More than three times a week    Frequency of Social Gatherings with Friends and Family: More  than three times a week    Attends Religious Services: More than 4 times per year    Active Member of [employer] or Organizations: Yes    Attends Banker Meetings: More than 4 times per year    Marital Status: Married  Catering manager Violence: Not At Risk (04/10/2024)  Humiliation, Afraid, Rape, and Kick questionnaire    Fear of Current or Ex-Partner: No    Emotionally Abused: No    Physically Abused: No    Sexually Abused: No     OBSERVATIONS/OBJECTIVE:  BP (!) 144/71 (BP Location: Right Wrist, Patient Position: Sitting)   Pulse 77   Temp 98.1 F (36.7 C) (Temporal)   Resp 18   Ht 5' 4 (1.626 m)   Wt 188 lb 9.6 oz (85.5 kg)   SpO2 99%   BMI 32.37 kg/m  GENERAL: Patient is a well appearing female in no acute distress BREAST EXAM: Left breast status postlumpectomy and radiation, no sign of local recurrence right breast is benign. LUNGS:  Clear to auscultation bilaterally.  No wheezes or rhonchi. HEART:  Regular rate and rhythm. No murmur appreciated. ABDOMEN:  Soft, nontender.   EXTREMITIES:  Mild LE edema SKIN:  Clear with no obvious rashes or skin changes. No nail dyscrasia. NEURO:  Nonfocal. Well oriented.  Appropriate affect.  LABORATORY DATA:  None for this visit.  DIAGNOSTIC IMAGING:  None for this visit.      ASSESSMENT AND PLAN:  Ms.. Trickey is a pleasant 73 y.o. female with Stage 1A left breast invasive ductal carcinoma, ER+/PR+/HER2-, diagnosed in August 2023, treated with lumpectomy, adjuvant radiation therapy, and anti-estrogen therapy with tamoxifen  beginning in 05/2022.    Assessment and Plan Assessment & Plan Estrogen receptor positive early stage breast cancer, adjuvant endocrine therapy management Discontinued tamoxifen  due to bilateral leg swelling, resolved after cessation. Discussed letrozole as an alternative, noting its side effects and lack of thromboembolic or endometrial risks. Emphasized importance of continuing endocrine therapy for at  least five years to reduce recurrence risk,  - Provide information on letrozole for review. - Discuss option to restart tamoxifen  or switch to letrozole. - Follow up in a couple of weeks to reassess decision on endocrine therapy.  Peripheral edema and medication-induced fluid retention Experienced weight gain and fluid retention after discontinuing hydrochlorothiazide   initially used to counteract Lyrica -induced fluid retention. Feels better off the diuretic but notices fluid retention and weight gain. - Discuss with primary care physician the option of using HCTZ as needed for fluid retention. - Monitor weight daily and use HCTC if weight increases by more than 2-3 pounds.  Syncope, recent hospitalization and evaluation Experienced syncope leading to hospitalization; cardiac evaluation showed no abnormalities. Syncope possibly related to dehydration, exertion, and medication effects. - Ensure adequate hydration to prevent syncope recurrence.  Chronic fatigue syndrome and chronic pain Continues Lyrica  for chronic pain management, essential for joint pain relief despite contributing to fluid retention. - Continue Lyrica  for chronic pain management.   Total time spent: 30 min  *Total Encounter Time as defined by the Centers for Medicare and Medicaid Services includes, in addition to the face-to-face time of a patient visit (documented in the note above) non-face-to-face time: obtaining and reviewing outside history, ordering and reviewing medications, tests or procedures, care coordination (communications with other health care professionals or caregivers) and documentation in the medical record.

## 2024-04-26 DIAGNOSIS — R55 Syncope and collapse: Secondary | ICD-10-CM | POA: Diagnosis not present

## 2024-04-29 DIAGNOSIS — I493 Ventricular premature depolarization: Secondary | ICD-10-CM | POA: Diagnosis not present

## 2024-04-29 DIAGNOSIS — I491 Atrial premature depolarization: Secondary | ICD-10-CM | POA: Diagnosis not present

## 2024-05-01 ENCOUNTER — Other Ambulatory Visit: Payer: Self-pay

## 2024-05-01 ENCOUNTER — Telehealth: Payer: Self-pay | Admitting: Hematology and Oncology

## 2024-05-01 DIAGNOSIS — R739 Hyperglycemia, unspecified: Secondary | ICD-10-CM

## 2024-05-03 ENCOUNTER — Ambulatory Visit
Admission: RE | Admit: 2024-05-03 | Discharge: 2024-05-03 | Disposition: A | Discharge status: Home / Self Care | Payer: Medicare PPO | Source: Ambulatory Visit | Attending: Hematology and Oncology | Admitting: Hematology and Oncology

## 2024-05-03 ENCOUNTER — Telehealth: Payer: Self-pay

## 2024-05-03 DIAGNOSIS — Z170 Estrogen receptor positive status [ER+]: Secondary | ICD-10-CM

## 2024-05-03 DIAGNOSIS — R928 Other abnormal and inconclusive findings on diagnostic imaging of breast: Secondary | ICD-10-CM | POA: Diagnosis not present

## 2024-05-03 DIAGNOSIS — J301 Allergic rhinitis due to pollen: Secondary | ICD-10-CM | POA: Diagnosis not present

## 2024-05-03 DIAGNOSIS — J3089 Other allergic rhinitis: Secondary | ICD-10-CM | POA: Diagnosis not present

## 2024-05-03 DIAGNOSIS — J3081 Allergic rhinitis due to animal (cat) (dog) hair and dander: Secondary | ICD-10-CM | POA: Diagnosis not present

## 2024-05-03 DIAGNOSIS — Z853 Personal history of malignant neoplasm of breast: Secondary | ICD-10-CM | POA: Diagnosis not present

## 2024-05-03 NOTE — Progress Notes (Signed)
 Complex Care Management Note  Care Guide Note 05/03/2024 Name: Lisa Morrison MRN: [id number] DOB: [birth year]  Lisa Morrison is a 73 y.o. year old female who sees Frann, Mabel Mt, DO for primary care. I reached out to Doyal GORMAN Mulders by phone today to offer complex care management services.  [name] was given information about Complex Care Management services today including:   The Complex Care Management services include support from the care team which includes your Nurse Care Manager, Clinical Social Worker, or Pharmacist.  The Complex Care Management team is here to help remove barriers to the health concerns and goals most important to you. Complex Care Management services are voluntary, and the patient may decline or stop services at any time by request to their care team member.   Complex Care Management Consent Status: Patient agreed to services and verbal consent obtained.   Follow up plan:  Telephone appointment with complex care management team member scheduled for:  05/10/24 at 9:15 a.m.   Encounter Outcome:  Patient Scheduled  Dreama [facility]  [facility], [facility] VBCI Assistant Direct Dial: [phone]  Fax: [phone]

## 2024-05-05 ENCOUNTER — Encounter: Payer: Self-pay | Admitting: Pharmacist

## 2024-05-05 NOTE — Progress Notes (Signed)
 It looks like prior authorization was approved 04/21/2024. Called [facility] to request them to do a test claim but they said they were not able to run a claim because they did not have Truliance in stock. I tried to call [employer] to get more information but was transferred four different times and provided with 5 different numbers / departments [phone] / [phone] / [phone] / [phone] / [phone] Was not able to wait longer due to call already taking over 30 minutes. Will try again later.     Called [employer] again and was not able to get any more information other than patient has met her $150 deductible for 2025. I will wait to see cost on Tuesday 9/2 when [facility] has Trulance  in stock. If is $300 then will contact [employer] for a tier exception.   She has tried Linzess in past but was not effective.  She is currently taking lubiprostone  / Amitiza  24mcg daily but is not always effective.   Patient notified of above.   Madelin Ray, PharmD Clinical Pharmacist [facility]  Population Health [phone]

## 2024-05-09 ENCOUNTER — Telehealth: Admitting: Hematology and Oncology

## 2024-05-10 ENCOUNTER — Other Ambulatory Visit: Admitting: Pharmacist

## 2024-05-10 DIAGNOSIS — G4733 Obstructive sleep apnea (adult) (pediatric): Secondary | ICD-10-CM | POA: Diagnosis not present

## 2024-05-10 NOTE — Progress Notes (Unsigned)
 05/10/2024 Name: EIKO MCGOWEN MRN: [id number] DOB: 1950/10/04  Chief Complaint  Patient presents with   Medication Management    Lisa Morrison is a 73 y.o. year old female who presented for a telephone visit.   They were referred to the pharmacist by their PCP for assistance in managing medication access and complex medication management.    Subjective:  Care Team: Primary Care Provider: Frann Mabel Mt, DO ; Next Scheduled Visit: 10/24/2024 Pulmonologist: Madelin Calin, NP; Next Scheduled Visit: 08/14/2024  Medication Access/Adherence  Current Pharmacy:  [id number] [address], [place] - [address] AT [facility] [address] [place] [address] Phone: [phone] Fax: [phone]   Patient reports affordability concerns with their medications: Yes  Patient reports access/transportation concerns to their pharmacy: No  Patient reports adherence concerns with their medications:  Yes  - she is not taking tamoxifen  due to hot flashes. She reports that oncology office is aware. She has filled and is considering retrying. There is also some concern that bupropion  which inhibits CYP2D6 could affect tamoxifen  metabolism to endoxifen. She has been taking bupropion  for anxiety and weight control.    She had an episode of passing out and difficulty breathing. Workup for cardio cause was negative. Today patient thinks that this episode was related to bing out in the sun, being dehydrated and not eating. Patient reports she has had difficulty breathing since she moved to [place] about 7 years ago. No one has been able to really tell her what is the cause of her shortness of breath. She sees allergist and uses inhalers but has not seen much improvement.   She was instructed to hold hydrochlorothiazide  (she is actually taking triamterene -hydrochlorothiazide ) when she was at [facility] but she restarted because she experienced a lot of  edema and weight gain when she was off of diuretic.  She also was told to hold nortriptyline  10mg  due to risk of dizziness. Patient stopped for 1 or 2 weeks but restarted due to anxiety. She takes nortriptyline  10mg  at 5am and 10mg  at 1pm every day.  IBS - Constipation:  Truliace 3mg  prescribed but cost is too high. Prior authorization approved but cost is $300 She is still taking lubiprostone  24mcg once a day - she has tried twice daily in past but had diarrhea. She feels that lubiprostone  once daily is not effective.  Has also taken Linzess in past - not effective.   Anxiety:  Current medications - nortriptyline  10mg  twice a day and bupropion  ER 150mg  twice a day  Past medications taken for anxiety -  Cymbalta (she was not sure why she stopped),  Celexa - unsure but patient thinks it might not have been effective Abilify - ineffective.  Clonazepam  - stopped due to potential effects on memory. Buspirone  - took in 2022 - did not tolerate  Fibromyalgia / Pain:  Patient is taking Pregabalin  for nerve pain / fibromyalgia - she does not feel like she would be able to not take pregabalin  even though she knows that pregabalin  can increase edema.     Objective:  Lab Results  Component Value Date   HGBA1C 5.5 03/25/2021    Lab Results  Component Value Date   CREATININE 0.85 04/21/2024   BUN 17 04/21/2024   NA 144 04/21/2024   K 4.4 04/21/2024   CL 104 04/21/2024   CO2 32 04/21/2024    Lab Results  Component Value Date  CHOL 238 (H) 04/21/2024   HDL 79.60 04/21/2024   LDLCALC 135 (H) 04/21/2024   TRIG 115.0 04/21/2024   CHOLHDL 3 04/21/2024    Medications Reviewed Today     Reviewed by Carla Milling, RPH-CPP (Pharmacist) on 05/10/24 at 1040  Med List Status: <None>   Medication Order Taking? Sig Documenting Provider Last Dose Status Informant  acetaminophen  (TYLENOL ) 650 MG CR tablet [id number] Yes Take 1,300 mg by mouth 2 (two) times daily as needed. [provider]  Active Self  albuterol  (VENTOLIN  HFA) 108 (90 Base) MCG/ACT inhaler [id number] Yes Inhale 1 puff into the lungs every 4 (four) hours as needed. [provider]  Active   Azelastine HCl 137 MCG/SPRAY SOLN [id number] Yes Place 1 spray into the nose at bedtime. [provider]  Active   Biotin (BIOTIN MAXIMUM STRENGTH) 10 MG TABS [id number] Yes Take 30 mg by mouth every morning. [provider]  Active   Budeson-Glycopyrrol-Formoterol (BREZTRI  AEROSPHERE) 160-9-4.8 MCG/ACT AERO [id number] Yes Inhale 2 puffs into the lungs in the morning and at bedtime. Frann Mabel Mt, DO  Active   buPROPion  (WELLBUTRIN  SR) 150 MG 12 hr tablet [id number] Yes TAKE 1 TABLET BY MOUTH TWICE A DAY Frann Mabel Mt, DO  Active      Patient not taking:   Discontinued 05/10/24 1030 (No longer needed (for PRN medications))            Med Note (CANTER, KAYLYN D   Fri Sep 25, 2022 10:47 AM) PRN   Discontinued 05/10/24 0952 (Entry Error)   levocetirizine (XYZAL) 5 MG tablet [id number] Yes   Patient taking differently: Take 5 mg by mouth every evening.   [provider]  Active   Magnesium 400 MG TABS [id number] Yes Take 400 mg by mouth daily. [provider]  Active   meloxicam (MOBIC) 7.5 MG tablet [id number] Yes   Patient taking differently: Take 7.5 mg by mouth daily as needed for pain.   [provider]  Active   montelukast (SINGULAIR) 10 MG tablet [id number] Yes TAKE 1 TABLET BY MOUTH EVERY DAY IN THE EVENING Wendling, Mabel Mt, DO  Active   nortriptyline  (PAMELOR ) 10 MG capsule [id number] Yes TAKE 1-2 CAPSULES (10-20 MG TOTAL) BY MOUTH AT BEDTIME.  Patient taking differently: Take 10 mg by mouth 2 (two) times daily. (Takes at 5am and 1pm)   Frann, Mabel Mt, DO  Active   Plecanatide  (TRULANCE ) 3 MG TABS [id number]  Take 1 tablet (3 mg total) by mouth daily.  Patient not taking: Reported on 05/10/2024   Frann Mabel Mt, DO   Active   potassium chloride  (MICRO-K ) 10 MEQ CR capsule [id number] Yes TAKE 2 CAPSULES BY MOUTH EVERY DAY Frann Mabel Mt, DO  Active   pregabalin  (LYRICA ) 150 MG capsule [id number] Yes TAKE 1 CAPSULE BY MOUTH EVERY NIGHT Frann Mabel Mt, DO  Active   tamoxifen  (NOLVADEX ) 20 MG tablet [id number]  Take 20 mg by mouth daily.  Patient not taking: Reported on 05/10/2024   [provider]  Active   topiramate  (TOPAMAX ) 50 MG tablet [id number] Yes TAKE 1 TABLET BY MOUTH EVERY DAY AT NIGHT Wendling, Mabel Mt, DO  Active   triamterene -hydrochlorothiazide  (DYAZIDE ) 37.5-25 MG capsule [id number] Yes Take 1 capsule by mouth daily. [provider]  Active               Assessment/Plan:   Anxiety: - Patient would like to try to taper off nortriptyline . If she tapers would  suggest lowering dose to just 1 capsule a day over the next 2 weeks.  - There is some concern that bupropion  affects metabolism of tamoxifen  but clinical significance of this interaction has not been established.  Unfortunately Cymbalta that was mentioned also has similar CYP2C6 metabolic effects. Recommended that she discuss with her oncology team about possible alternatives to tamoxifen  since she stopped due to hot flashes.  - Sertraline or escitalopram could be an options to try.  Medication Management: - Called [employer] to request tier exception for Trulance  - provided clinical information (ID number for request is #[id number]) - Sent patient application for Trulance  patient assistance program.  - Until we hear about Trulance  tier exception / patient assistance program, patient could continue Lubiproston 24mcg daily and add an 8mcg dose to see if this is effective since she had diarrhea with 24mcg twice a day.     Follow Up Plan: 2 to 3 weeks   Madelin Ray, PharmD Clinical Pharmacist [facility]

## 2024-05-11 ENCOUNTER — Other Ambulatory Visit: Payer: Self-pay | Admitting: Family Medicine

## 2024-05-11 MED ORDER — LUBIPROSTONE 8 MCG PO CAPS
ORAL_CAPSULE | ORAL | 1 refills | Status: DC
Start: 2024-05-11 — End: 2024-05-22

## 2024-05-12 ENCOUNTER — Telehealth: Payer: Self-pay

## 2024-05-12 NOTE — Telephone Encounter (Signed)
 Copied from CRM [id number]. Topic: Referral - Question >> May 12, 2024  1:03 PM Paige D wrote: Reason for CRM: Pt states she is suppose to get a bone density test she was scheduled they called and cancelled because they no longer do those test . They give her [phone] [facility].  Unless bone density can be down stairs she will do It there. Please reach out pt as she has questions in regards to this.

## 2024-05-12 NOTE — Addendum Note (Signed)
 Addended by: Quetzalli Clos D on: 05/12/2024 01:58 PM   Modules accepted: Orders

## 2024-05-12 NOTE — Telephone Encounter (Signed)
 Order changed. Mychart message sent.

## 2024-05-15 DIAGNOSIS — R002 Palpitations: Secondary | ICD-10-CM | POA: Diagnosis not present

## 2024-05-15 DIAGNOSIS — I2510 Atherosclerotic heart disease of native coronary artery without angina pectoris: Secondary | ICD-10-CM | POA: Diagnosis not present

## 2024-05-15 DIAGNOSIS — R0602 Shortness of breath: Secondary | ICD-10-CM | POA: Diagnosis not present

## 2024-05-15 DIAGNOSIS — R079 Chest pain, unspecified: Secondary | ICD-10-CM | POA: Diagnosis not present

## 2024-05-17 DIAGNOSIS — J3089 Other allergic rhinitis: Secondary | ICD-10-CM | POA: Diagnosis not present

## 2024-05-17 DIAGNOSIS — J301 Allergic rhinitis due to pollen: Secondary | ICD-10-CM | POA: Diagnosis not present

## 2024-05-17 DIAGNOSIS — J3081 Allergic rhinitis due to animal (cat) (dog) hair and dander: Secondary | ICD-10-CM | POA: Diagnosis not present

## 2024-05-22 ENCOUNTER — Other Ambulatory Visit (INDEPENDENT_AMBULATORY_CARE_PROVIDER_SITE_OTHER)

## 2024-05-22 DIAGNOSIS — K5909 Other constipation: Secondary | ICD-10-CM

## 2024-05-22 DIAGNOSIS — M797 Fibromyalgia: Secondary | ICD-10-CM

## 2024-05-22 DIAGNOSIS — F411 Generalized anxiety disorder: Secondary | ICD-10-CM

## 2024-05-22 NOTE — Progress Notes (Signed)
 05/10/2024 Name: Lisa Morrison MRN: [id number] DOB: 27-Dec-1950  Chief Complaint  Patient presents with   Medication Management    Lisa Morrison is a 73 y.o. year old female who presented for a telephone visit.   They were referred to the pharmacist by their PCP for assistance in managing medication access and complex medication management.    Subjective:  Care Team: Primary Care Provider: Frann Mabel Mt, DO ; Next Scheduled Visit: 10/24/2024 Pulmonologist: Madelin Calin, NP; Next Scheduled Visit: 08/14/2024  Medication Access/Adherence  Current Pharmacy:  [id number] [address], [place] - [address] [facility] [address] [place] [address] Phone: [phone] Fax: [phone]   Patient reports affordability concerns with their medications: Yes  Patient reports access/transportation concerns to their pharmacy: No  Patient reports adherence concerns with their medications:  Yes  - she is not taking tamoxifen  due to hot flashes. She reports that oncology office is aware. She has filled and is considering retrying. There is also some concern that bupropion  which inhibits CYP2D6 could affect tamoxifen  metabolism to endoxifen. She has been taking bupropion  for anxiety and weight control.    She had an episode of passing out and difficulty breathing. Workup for cardio cause was negative. She did meet with cardiology team at [facility] last week. They recommended starting statin but patient is not sure she wants to start statin. She has not picked up Rx yet.  They also recommended she consider trial of furosemide in place of Triamterene -hydrochlorothiazide  to see if might help with shortness of breath. She was noted in past to have tried furosemide but it was stopped due to drop in potassium She is currently taking potassium supplement daily.   Patient reports she has had difficulty breathing since she moved to [place] about 7 years  ago. No one has been able to really tell her what is the cause of her shortness of breath. She sees allergist and uses inhalers but has not seen much improvement.     She also was told to hold nortriptyline  10mg  due to risk of dizziness when she left the hospital in. Patient stopped for 1 or 2 weeks but restarted due to anxiety. She takes nortriptyline  10mg  at 5am and 10mg  at 1pm every day. She does feel that the morning dose dose make her a little drowsy.   IBS - Constipation:  Trulace 3mg  prescribed but cost is too high. Prior authorization approved but cost is $300. Tier exception was approved and now cost is $10 / month. She reports that constipation has improved with Trulance .  She has tried lubiprostone  24mcg + and Linzess in past - not effective.   Anxiety:  Current medications - nortriptyline  10mg  twice a day and bupropion  ER 150mg  twice a day  Past medications taken for anxiety -  Cymbalta (she was not sure why she stopped),  Celexa - unsure but patient thinks it might not have been effective Abilify - ineffective.  Clonazepam  - stopped due to potential effects on memory. Buspirone  - took in 2022 - did not tolerate  Fibromyalgia / Pain:  Patient is taking Pregabalin  for nerve pain / fibromyalgia - she does not feel like she would be able to not take pregabalin  even though she knows that pregabalin  can increase edema.     Objective:  Lab Results  Component Value Date   HGBA1C 5.5 03/25/2021    Lab Results  Component Value  Date   CREATININE 0.85 04/21/2024   BUN 17 04/21/2024   NA 144 04/21/2024   K 4.4 04/21/2024   CL 104 04/21/2024   CO2 32 04/21/2024    Lab Results  Component Value Date   CHOL 238 (H) 04/21/2024   HDL 79.60 04/21/2024   LDLCALC 135 (H) 04/21/2024   TRIG 115.0 04/21/2024   CHOLHDL 3 04/21/2024    Medications Reviewed Today     Reviewed by Carla Milling, RPH-CPP (Pharmacist) on 05/22/24 at 1356  Med List Status: <None>   Medication  Order Taking? Sig Documenting Provider Last Dose Status Informant  acetaminophen  (TYLENOL ) 650 MG CR tablet [id number] Yes Take 1,300 mg by mouth 2 (two) times daily as needed. [provider]  Active Self  albuterol  (VENTOLIN  HFA) 108 (90 Base) MCG/ACT inhaler [id number] Yes Inhale 1 puff into the lungs every 4 (four) hours as needed. [provider]  Active   Azelastine HCl 137 MCG/SPRAY SOLN [id number] Yes Place 1 spray into the nose at bedtime. [provider]  Active   Biotin (BIOTIN MAXIMUM STRENGTH) 10 MG TABS [id number] Yes Take 30 mg by mouth every morning. [provider]  Active   Budeson-Glycopyrrol-Formoterol (BREZTRI  AEROSPHERE) 160-9-4.8 MCG/ACT AERO [id number] Yes Inhale 2 puffs into the lungs in the morning and at bedtime. Frann Mabel Mt, DO  Active   buPROPion  (WELLBUTRIN  SR) 150 MG 12 hr tablet [id number] Yes TAKE 1 TABLET BY MOUTH TWICE A DAY Wendling, Mabel Mt, DO  Active   levocetirizine (XYZAL) 5 MG tablet [id number]    Patient taking differently: Take 5 mg by mouth every evening.   [provider]  Active     Discontinued 05/22/24 1307     Discontinued 05/22/24 1307   Magnesium 400 MG TABS [id number] Yes Take 400 mg by mouth daily. [provider]  Active   meloxicam (MOBIC) 7.5 MG tablet [id number] Yes  [provider]  Active   montelukast (SINGULAIR) 10 MG tablet [id number] Yes TAKE 1 TABLET BY MOUTH EVERY DAY IN THE EVENING Wendling, Mabel Mt, DO  Active   nortriptyline  (PAMELOR ) 10 MG capsule [id number] Yes TAKE 1-2 CAPSULES (10-20 MG TOTAL) BY MOUTH AT BEDTIME.  Patient taking differently: Take 10 mg by mouth 2 (two) times daily. (Takes at 5am and 1pm)   Frann, Mabel Mt, DO  Active   Plecanatide  (TRULANCE ) 3 MG TABS [id number] Yes Take 1 tablet (3 mg total) by mouth daily. Frann Mabel Mt, DO  Active   potassium chloride  (MICRO-K ) 10 MEQ CR capsule [id number] Yes TAKE 2 CAPSULES BY  MOUTH EVERY DAY Frann Mabel Mt, DO  Active   pregabalin  (LYRICA ) 150 MG capsule [id number] Yes TAKE 1 CAPSULE BY MOUTH EVERY NIGHT Wendling, Mabel Mt, DO  Active   tamoxifen  (NOLVADEX ) 20 MG tablet [id number]  Take 20 mg by mouth daily.  Patient not taking: Reported on 05/22/2024   [provider]  Active   topiramate  (TOPAMAX ) 50 MG tablet [id number] Yes TAKE 1 TABLET BY MOUTH EVERY DAY AT NIGHT Wendling, Mabel Mt, DO  Active   triamterene -hydrochlorothiazide  (DYAZIDE ) 37.5-25 MG capsule [id number] Yes Take 1 capsule by mouth daily. [provider]  Active               Assessment/Plan:   Anxiety: - Patient would like to try to taper off nortriptyline . If she tapers would suggest lowering dose to just 1 capsule a day over the next 2 weeks.  - There is some concern  that bupropion  affects metabolism of tamoxifen  but clinical significance of this interaction has not been established.  Unfortunately Cymbalta that was mentioned also has similar CYP2C6 metabolic effects. Recommended that she discuss with her oncology team about possible alternatives to tamoxifen  since she stopped due to hot flashes.  - Sertraline or escitalopram could be an options to try will discuss with Dr Frann  Medication Management: - Reviewed lipid results. Discussed benefit of statin therapy. Patient is considering starting rosuvastatin 5mg  daily recommended by cardiologist's office.   - Patient mentioned sty she has had off and on for 1 month. Assisted with getting appt with PCP for acute visit.    Follow Up Plan: January 2026 to make sure Trulance  tier exception does not need to be renewed.   Madelin Ray, PharmD Clinical Pharmacist [facility] SW [facility]

## 2024-05-22 NOTE — Telephone Encounter (Signed)
 [name]

## 2024-05-23 ENCOUNTER — Encounter: Payer: Self-pay | Admitting: Hematology and Oncology

## 2024-05-23 ENCOUNTER — Encounter: Payer: Self-pay | Admitting: Family Medicine

## 2024-05-23 ENCOUNTER — Telehealth (INDEPENDENT_AMBULATORY_CARE_PROVIDER_SITE_OTHER): Admitting: Family Medicine

## 2024-05-23 DIAGNOSIS — H029 Unspecified disorder of eyelid: Secondary | ICD-10-CM

## 2024-05-23 DIAGNOSIS — H5789 Other specified disorders of eye and adnexa: Secondary | ICD-10-CM | POA: Diagnosis not present

## 2024-05-23 MED ORDER — ERYTHROMYCIN 5 MG/GM OP OINT: 1.0000 | TOPICAL_OINTMENT | Freq: Every day | OPHTHALMIC | 0 refills | Status: DC

## 2024-05-23 NOTE — Progress Notes (Signed)
 Chief Complaint  Patient presents with   Stye    Stye    Lisa Morrison is a 73 y.o. female here for a skin complaint. We are interacting via web portal for an electronic face-to-face visit. I verified patient's ID using 2 identifiers. Patient agreed to proceed with visit via this method. Patient is at home, I am at office. Patient and I are present for visit.   Duration: 1 month Location: L uipper lid Pruritic? No Painful? Yes Drainage? Yes- straw colored fluid New soaps/lotions/topicals/detergents? No Trauma? No Other associated symptoms: no fevers, vision changes, spreading redness Therapies tried thus far: Warm compresses  Past Medical History:  Diagnosis Date   Anxiety    Arthritis    Breast cancer (HCC)    CFS (chronic fatigue syndrome)    Depression    denies now   Dyspnea    Edema    due to taking lyrica    Family history of adverse reaction to anesthesia    son had n/v   Family history of pancreatic cancer 05/03/2022   Fibromyalgia    IBS (irritable bowel syndrome)    Leaky heart valve    Migraine    Orthostatic hypotension    from a  previous surgery    PONV (postoperative nausea and vomiting)    and heart rate goes down  I fainted after surgery when standing in the restroom.   Seasonal allergies    Spondylolisthesis of lumbar region    Wears glasses    No conversational dyspnea Age appropriate judgment and insight Nml affect and mood  Eyelid lesion  Eye irritation - Plan: erythromycin  ophthalmic ointment  She will schedule an appointment with her eye doctor.  Erythromycin  ointment as above.  Artificial tears, warm compresses with massage towards the eyelashes. F/u prn. The patient voiced understanding and agreement to the plan.  Lisa Mt [place], DO 05/23/24 4:36 PM

## 2024-05-24 ENCOUNTER — Telehealth: Payer: Self-pay

## 2024-05-24 NOTE — Telephone Encounter (Signed)
 S/w pt per MD reply. Pt states she has been taking wellbutrin  for 41 years, as she started it when she had a hysterectomy at 31. She is reluctant to start back on Tamoxifen  d/t hot flashes and hair thinning. We discussed this further and she was educated on paced breathing. She had several questions about the medication. She had to cancer her scheduled phone visit with Dr Loretha and was offered to r/s so they can further discuss these questions. She is agreeable and accepted appt for 9/18 at 0830 for a phone visit.

## 2024-05-25 ENCOUNTER — Inpatient Hospital Stay: Attending: Hematology and Oncology | Admitting: Hematology and Oncology

## 2024-05-25 DIAGNOSIS — Z170 Estrogen receptor positive status [ER+]: Secondary | ICD-10-CM | POA: Diagnosis not present

## 2024-05-25 DIAGNOSIS — C50212 Malignant neoplasm of upper-inner quadrant of left female breast: Secondary | ICD-10-CM

## 2024-05-25 NOTE — Progress Notes (Signed)
 BRIEF ONCOLOGIC HISTORY:  Oncology History  Malignant neoplasm of upper-inner quadrant of left breast in female, estrogen receptor positive (HCC)  04/08/2022 Mammogram   Screening mammogram shows distortion in the left breast warranting further evaluation.  No findings suspicious for malignancy in the right breast.  Left breast diagnostic mammogram confirmed a 6 mm mass in the 11 o'clock position of the left breast with imaging features highly suspicious for malignancy.  Ultrasound of the left axilla demonstrated normal-appearing left axillary lymph nodes    Pathology Results   Pathology from the left breast biopsy showed low-grade invasive well-differentiated ductal adenocarcinoma along with low-grade DCIS showing ER 100% positive strong staining PR 100% positive strong staining, Ki-67 of less than 1% and HER2 negative 1+ by IHC   05/22/2022 Surgery   She had left breast lumpectomy which showed 0.7 cm grade 1 invasive ductal carcinoma, low-grade DCIS, all margins negative for invasive carcinoma, all lymph nodes negative for metastatic carcinoma.     05/2022 -  Anti-estrogen oral therapy   Tamoxifen    06/24/2022 - 07/21/2022 Radiation Therapy   Site Technique Total Dose (Gy) Dose per Fx (Gy) Completed Fx Beam Energies  Breast, Left: Breast_L 3D 40.05/40.05 2.67 15/15 6XFFF, 10XFFF  Breast, Left: Breast_L_Bst specialPort 10/10 2 5/5 12E       INTERVAL HISTORY:   Discussed the use of AI scribe software for clinical note transcription with the patient, who gave verbal consent to proceed.  History of Present Illness Lisa Morrison is a 73 year old female with chronic fatigue syndrome and early-stage estrogen-positive breast cancer who is here for telephone follow up. Since her last visit, she apparently had a conversation with pharmacist who suggested some interactions with tamoxifen , hence she wanted to talk to me. She says she is very worried about hair thinning, hot flashes and leg swelling  again. Rest of the pertinent 10 point ROS reviewed and neg  PAST MEDICAL/SURGICAL HISTORY:  Past Medical History:  Diagnosis Date   Anxiety    Arthritis    Breast cancer (HCC)    CFS (chronic fatigue syndrome)    Depression    denies now   Dyspnea    Edema    due to taking lyrica    Family history of adverse reaction to anesthesia    son had n/v   Family history of pancreatic cancer 05/03/2022   Fibromyalgia    IBS (irritable bowel syndrome)    Leaky heart valve    Migraine    Orthostatic hypotension    from a  previous surgery    PONV (postoperative nausea and vomiting)    and heart rate goes down  I fainted after surgery when standing in the restroom.   Seasonal allergies    Spondylolisthesis of lumbar region    Wears glasses    Past Surgical History:  Procedure Laterality Date   ABDOMINAL HYSTERECTOMY     back ablasion     03/2018 and 05/2018   back stimulator  2022   BACK SURGERY  2015   BREAST LUMPECTOMY WITH RADIOACTIVE SEED AND SENTINEL LYMPH NODE BIOPSY Left 05/22/2022   Procedure: LEFT BREAST LUMPECTOMY WITH RADIOACTIVE SEED AND SENTINEL LYMPH NODE BIOPSY;  Surgeon: Curvin Deward MOULD, MD;  Location: [facility];  Service: General;  Laterality: Left;   CHOLECYSTECTOMY     HERNIA REPAIR     x2   OOPHORECTOMY     VARICOSE VEIN SURGERY       ALLERGIES:  Allergies  Allergen Reactions   Augmentin [Amoxicillin-Pot Clavulanate] Rash    Did it involve swelling of the face/tongue/throat, SOB, or low BP? Yes Did it involve sudden or severe rash/hives, skin peeling, or any reaction on the inside of your mouth or nose? Yes Did you need to seek medical attention at a hospital or doctor's office? Yes When did it last happen? Over 10 years ago If all above answers are NO, may proceed with cephalosporin use.   Demerol  [Meperidine ] Other (See Comments)    Lowers blood pressure    Amoxicillin     Other reaction(s): Loss of consciousness   Clavulanic Acid      Other reaction(s): lowers blood pressure     CURRENT MEDICATIONS:  Outpatient Encounter Medications as of 05/25/2024  Medication Sig   acetaminophen  (TYLENOL ) 650 MG CR tablet Take 1,300 mg by mouth 2 (two) times daily as needed.   albuterol  (VENTOLIN  HFA) 108 (90 Base) MCG/ACT inhaler Inhale 1 puff into the lungs every 4 (four) hours as needed.   Azelastine HCl 137 MCG/SPRAY SOLN Place 1 spray into the nose at bedtime.   Biotin (BIOTIN MAXIMUM STRENGTH) 10 MG TABS Take 30 mg by mouth every morning.   Budeson-Glycopyrrol-Formoterol (BREZTRI  AEROSPHERE) 160-9-4.8 MCG/ACT AERO Inhale 2 puffs into the lungs in the morning and at bedtime.   buPROPion  (WELLBUTRIN  SR) 150 MG 12 hr tablet TAKE 1 TABLET BY MOUTH TWICE A DAY   erythromycin  ophthalmic ointment Place 1 Application into the left eye at bedtime.   levocetirizine (XYZAL) 5 MG tablet  (Patient taking differently: Take 5 mg by mouth every evening.)   Magnesium 400 MG TABS Take 400 mg by mouth daily.   meloxicam (MOBIC) 7.5 MG tablet    montelukast (SINGULAIR) 10 MG tablet TAKE 1 TABLET BY MOUTH EVERY DAY IN THE EVENING   nortriptyline  (PAMELOR ) 10 MG capsule TAKE 1-2 CAPSULES (10-20 MG TOTAL) BY MOUTH AT BEDTIME. (Patient taking differently: Take 10 mg by mouth 2 (two) times daily. (Takes at 5am and 1pm))   Plecanatide  (TRULANCE ) 3 MG TABS Take 1 tablet (3 mg total) by mouth daily.   potassium chloride  (MICRO-K ) 10 MEQ CR capsule TAKE 2 CAPSULES BY MOUTH EVERY DAY   pregabalin  (LYRICA ) 150 MG capsule TAKE 1 CAPSULE BY MOUTH EVERY NIGHT   rosuvastatin (CRESTOR) 5 MG tablet Take 5 mg by mouth daily. (Patient not taking: Reported on 05/22/2024)   tamoxifen  (NOLVADEX ) 20 MG tablet Take 20 mg by mouth daily. (Patient not taking: Reported on 05/22/2024)   topiramate  (TOPAMAX ) 50 MG tablet TAKE 1 TABLET BY MOUTH EVERY DAY AT NIGHT   triamterene -hydrochlorothiazide  (DYAZIDE ) 37.5-25 MG capsule Take 1 capsule by mouth daily.   No  facility-administered encounter medications on file as of 05/25/2024.     ONCOLOGIC FAMILY HISTORY:  Family History  Problem Relation Age of Onset   Mitral valve prolapse Mother    Coronary aneurysm Father    Diabetes Father    Pancreatic cancer Maternal Uncle 66   Healthy Son       SOCIAL HISTORY:  Social History   Socioeconomic History   Marital status: Married    Spouse name: Not on file   Number of children: Not on file   Years of education: Not on file   Highest education level: Associate degree: academic program  Occupational History   Not on file  Tobacco Use   Smoking status: Never    Passive exposure: Past   Smokeless tobacco: Never  Vaping Use  Vaping status: Never Used  Substance and Sexual Activity   Alcohol use: Never   Drug use: Never   Sexual activity: Not Currently  Other Topics Concern   Not on file  Social History Narrative   Not on file   Social Drivers of Health   Financial Resource Strain: Low Risk  (04/20/2024)   Overall Financial Resource Strain (CARDIA)    Difficulty of Paying Living Expenses: Not hard at all  Food Insecurity: Low Risk  (05/15/2024)   Received from [facility]   Hunger Vital Sign    Within the past 12 months, you worried that your food would run out before you got money to buy more: Never true    Within the past 12 months, the food you bought just didn't last and you didn't have money to get more. : Never true  Transportation Needs: No Transportation Needs (05/15/2024)   Received from [facility]    In the past 12 months, has lack of reliable transportation kept you from medical appointments, meetings, work or from getting things needed for daily living? : No  Physical Activity: Inactive (04/20/2024)   Exercise Vital Sign    Days of Exercise per Week: 0 days    Minutes of Exercise per Session: Not on file  Stress: No Stress Concern Present (04/20/2024)   [employer] of Occupational Health -  Occupational Stress Questionnaire    Feeling of Stress: Only a little  Social Connections: Socially Integrated (04/20/2024)   Social Connection and Isolation Panel    Frequency of Communication with Friends and Family: More than three times a week    Frequency of Social Gatherings with Friends and Family: More than three times a week    Attends Religious Services: More than 4 times per year    Active Member of [employer] or Organizations: Yes    Attends Engineer, structural: More than 4 times per year    Marital Status: Married  Catering manager Violence: Not At Risk (04/10/2024)   Humiliation, Afraid, Rape, and Kick questionnaire    Fear of Current or Ex-Partner: No    Emotionally Abused: No    Physically Abused: No    Sexually Abused: No     OBSERVATIONS/OBJECTIVE:  There were no vitals taken for this visit.  Telephone visit.  LABORATORY DATA:  None for this visit.  DIAGNOSTIC IMAGING:  None for this visit.      ASSESSMENT AND PLAN:  Ms.. Baria is a pleasant 73 y.o. female with Stage 1A left breast invasive ductal carcinoma, ER+/PR+/HER2-, diagnosed in August 2023, treated with lumpectomy, adjuvant radiation therapy, and anti-estrogen therapy with tamoxifen  beginning in 05/2022.    Assessment and Plan Assessment & Plan Estrogen receptor positive early stage breast cancer, adjuvant endocrine therapy management Discontinued tamoxifen  due to bilateral leg swelling, resolved after cessation. Discussed letrozole as an alternative, noting its side effects and lack of thromboembolic or endometrial risks. Emphasized importance of continuing endocrine therapy for at least five years to reduce recurrence risk,  She is here for a telephone visit. We discussed that wellbutrin  can reduce the effectiveness of tamoxifen . But this is a long term medication, so I am ok with her re starting the tamoxifen . She is very reluctant to try the letrozole.  We have discussed the symptoms of DVT  again. I strongly encouraged her to give me a call about any other issues once she starts tamoxifen . She will continue biotin supplements or try nutrafol for hair  growth.   Total time spent: 10 min  I connected with  Doyal GORMAN Mulders on 05/25/24 by a telephone application and verified that I am speaking with the correct person using two identifiers.   I discussed the limitations of evaluation and management by telemedicine. The patient expressed understanding and agreed to proceed.  Location of pt: Home Location of provider: office.  *Total Encounter Time as defined by the Centers for Medicare and Medicaid Services includes, in addition to the face-to-face time of a patient visit (documented in the note above) non-face-to-face time: obtaining and reviewing outside history, ordering and reviewing medications, tests or procedures, care coordination (communications with other health care professionals or caregivers) and documentation in the medical record.

## 2024-06-06 ENCOUNTER — Other Ambulatory Visit: Payer: Self-pay | Admitting: Family Medicine

## 2024-06-09 DIAGNOSIS — G4733 Obstructive sleep apnea (adult) (pediatric): Secondary | ICD-10-CM | POA: Diagnosis not present

## 2024-06-13 ENCOUNTER — Ambulatory Visit (INDEPENDENT_AMBULATORY_CARE_PROVIDER_SITE_OTHER): Payer: Medicare PPO

## 2024-06-13 VITALS — Ht 64.0 in | Wt 188.0 lb

## 2024-06-13 DIAGNOSIS — Z0000 Encounter for general adult medical examination without abnormal findings: Secondary | ICD-10-CM

## 2024-06-13 NOTE — Patient Instructions (Addendum)
 Ms. Dack,  Thank you for taking the time for your Medicare Wellness Visit. I appreciate your continued commitment to your health goals. Please review the care plan we discussed, and feel free to reach out if I can assist you further.  Medicare recommends these wellness visits once per year to help you and your care team stay ahead of potential health issues. These visits are designed to focus on prevention, allowing your provider to concentrate on managing your acute and chronic conditions during your regular appointments.  Please note that Annual Wellness Visits do not include a physical exam. Some assessments may be limited, especially if the visit was conducted virtually. If needed, we may recommend a separate in-person follow-up with your provider.  Ongoing Care Seeing your primary care provider every 3 to 6 months helps us  monitor your health and provide consistent, personalized care.   Referrals If a referral was made during today's visit and you haven't received any updates within two weeks, please contact the referred provider directly to check on the status.  Recommended Screenings:  Health Maintenance  Topic Date Due   DEXA scan (bone density measurement)  05/02/2023   Breast Cancer Screening  05/03/2025   Medicare Annual Wellness Visit  06/13/2025   Colon Cancer Screening  12/02/2027   DTaP/Tdap/Td vaccine (3 - Td or Tdap) 07/27/2033   Pneumococcal Vaccine for age over 71  Completed   Hepatitis C Screening  Completed   Zoster (Shingles) Vaccine  Completed   Meningitis B Vaccine  Aged Out   Flu Shot  Discontinued   COVID-19 Vaccine  Discontinued       06/13/2024   10:37 AM  Advanced Directives  Does Patient Have a Medical Advance Directive? Yes  Type of Estate agent of [place];Living will  Copy of Healthcare Power of Attorney in Chart? No - copy requested   Advance Care Planning is important because it: Ensures you receive medical care that  aligns with your values, goals, and preferences. Provides guidance to your family and loved ones, reducing the emotional burden of decision-making during critical moments.  Vision: Annual vision screenings are recommended for early detection of glaucoma, cataracts, and diabetic retinopathy. These exams can also reveal signs of chronic conditions such as diabetes and high blood pressure.  Dental: Annual dental screenings help detect early signs of oral cancer, gum disease, and other conditions linked to overall health, including heart disease and diabetes.  Please see the attached documents for additional preventive care recommendations.

## 2024-06-13 NOTE — Progress Notes (Signed)
 Subjective:   Lisa Morrison is a 73 y.o. who presents for a Medicare Wellness preventive visit.  As a reminder, Annual Wellness Visits don't include a physical exam, and some assessments may be limited, especially if this visit is performed virtually. We may recommend an in-person follow-up visit with your provider if needed.  Visit Complete: Virtual I connected with  Lisa Morrison on 06/13/24 by a audio enabled telemedicine application and verified that I am speaking with the correct person using two identifiers.  Patient Location: Home  Provider Location: Home Office  I discussed the limitations of evaluation and management by telemedicine. The patient expressed understanding and agreed to proceed.  Vital Signs: Because this visit was a virtual/telehealth visit, some criteria may be missing or patient reported. Any vitals not documented were not able to be obtained and vitals that have been documented are patient reported.    Persons Participating in Visit: Patient.  AWV Questionnaire: Yes: Patient Medicare AWV questionnaire was completed by the patient on 06/13/24; I have confirmed that all information answered by patient is correct and no changes since this date.  Cardiac Risk Factors include: advanced age (>84men, >33 women);hypertension     Objective:    Today's Vitals   06/13/24 1022  Weight: 188 lb (85.3 kg)  Height: 5' 4 (1.626 m)   Body mass index is 32.27 kg/m.     06/13/2024   10:37 AM 04/25/2024    9:09 AM 06/10/2023   10:05 AM 11/10/2022    9:17 AM 08/20/2022   10:06 AM 06/16/2022    7:47 AM 06/05/2022    9:49 AM  Advanced Directives  Does Patient Have a Medical Advance Directive? Yes Yes No Yes No Yes Yes  Type of Estate agent of [place];Living will Healthcare Power of [place];Living will  Healthcare Power of [place];Living will Living will Healthcare Power of [place];Living will Healthcare Power of [place];Living will  Does patient  want to make changes to medical advance directive?  No - Patient declined  No - Patient declined  No - Patient declined No - Patient declined  Copy of Healthcare Power of Attorney in Chart? No - copy requested No - copy requested  No - copy requested  No - copy requested No - copy requested  Would patient like information on creating a medical advance directive?   No - Patient declined        Current Medications (verified) Outpatient Encounter Medications as of 06/13/2024  Medication Sig   acetaminophen  (TYLENOL ) 650 MG CR tablet Take 1,300 mg by mouth 2 (two) times daily as needed.   albuterol  (VENTOLIN  HFA) 108 (90 Base) MCG/ACT inhaler Inhale 1 puff into the lungs every 4 (four) hours as needed.   Azelastine HCl 137 MCG/SPRAY SOLN Place 1 spray into the nose at bedtime.   Biotin (BIOTIN MAXIMUM STRENGTH) 10 MG TABS Take 30 mg by mouth every morning.   Budeson-Glycopyrrol-Formoterol (BREZTRI  AEROSPHERE) 160-9-4.8 MCG/ACT AERO Inhale 2 puffs into the lungs in the morning and at bedtime.   buPROPion  (WELLBUTRIN  SR) 150 MG 12 hr tablet TAKE 1 TABLET BY MOUTH TWICE A DAY   erythromycin  ophthalmic ointment Place 1 Application into the left eye at bedtime.   levocetirizine (XYZAL) 5 MG tablet  (Patient taking differently: Take 5 mg by mouth every evening.)   Magnesium 400 MG TABS Take 400 mg by mouth daily.   meloxicam (MOBIC) 7.5 MG tablet    montelukast (SINGULAIR) 10 MG tablet TAKE 1  TABLET BY MOUTH EVERY DAY IN THE EVENING   nortriptyline  (PAMELOR ) 10 MG capsule TAKE 1-2 CAPSULES (10-20 MG TOTAL) BY MOUTH AT BEDTIME. (Patient taking differently: Take 10 mg by mouth 2 (two) times daily. (Takes at 5am and 1pm))   Plecanatide  (TRULANCE ) 3 MG TABS Take 1 tablet (3 mg total) by mouth daily.   potassium chloride  (MICRO-K ) 10 MEQ CR capsule TAKE 2 CAPSULES BY MOUTH EVERY DAY   pregabalin  (LYRICA ) 150 MG capsule TAKE 1 CAPSULE BY MOUTH EVERY NIGHT   rosuvastatin (CRESTOR) 5 MG tablet Take 5 mg by mouth  daily. (Patient not taking: Reported on 05/22/2024)   tamoxifen  (NOLVADEX ) 20 MG tablet Take 20 mg by mouth daily. (Patient not taking: Reported on 05/22/2024)   topiramate  (TOPAMAX ) 50 MG tablet TAKE 1 TABLET BY MOUTH EVERY DAY AT NIGHT   triamterene -hydrochlorothiazide  (DYAZIDE ) 37.5-25 MG capsule Take 1 capsule by mouth daily.   No facility-administered encounter medications on file as of 06/13/2024.    Allergies (verified) Augmentin [amoxicillin-pot clavulanate], Demerol  [meperidine ], Amoxicillin, and Clavulanic acid   History: Past Medical History:  Diagnosis Date   Anxiety    Arthritis    Breast cancer (HCC)    CFS (chronic fatigue syndrome)    Depression    denies now   Dyspnea    Edema    due to taking lyrica    Family history of adverse reaction to anesthesia    son had n/v   Family history of pancreatic cancer 05/03/2022   Fibromyalgia    IBS (irritable bowel syndrome)    Leaky heart valve    Migraine    Orthostatic hypotension    from a  previous surgery    PONV (postoperative nausea and vomiting)    and heart rate goes down  I fainted after surgery when standing in the restroom.   Seasonal allergies    Spondylolisthesis of lumbar region    Wears glasses    Past Surgical History:  Procedure Laterality Date   ABDOMINAL HYSTERECTOMY     back ablasion     03/2018 and 05/2018   back stimulator  2022   BACK SURGERY  2015   BREAST LUMPECTOMY WITH RADIOACTIVE SEED AND SENTINEL LYMPH NODE BIOPSY Left 05/22/2022   Procedure: LEFT BREAST LUMPECTOMY WITH RADIOACTIVE SEED AND SENTINEL LYMPH NODE BIOPSY;  Surgeon: Curvin Deward MOULD, MD;  Location: [facility];  Service: General;  Laterality: Left;   CHOLECYSTECTOMY     HERNIA REPAIR     x2   OOPHORECTOMY     VARICOSE VEIN SURGERY     Family History  Problem Relation Age of Onset   Mitral valve prolapse Mother    Coronary aneurysm Father    Diabetes Father    Pancreatic cancer Maternal Uncle 6   Healthy  Son    Social History   Socioeconomic History   Marital status: Married    Spouse name: Not on file   Number of children: Not on file   Years of education: Not on file   Highest education level: Associate degree: academic program  Occupational History   Not on file  Tobacco Use   Smoking status: Never    Passive exposure: Past   Smokeless tobacco: Never  Vaping Use   Vaping status: Never Used  Substance and Sexual Activity   Alcohol use: Never   Drug use: Never   Sexual activity: Not Currently  Other Topics Concern   Not on file  Social History Narrative  Not on file   Social Drivers of Health   Financial Resource Strain: Low Risk  (06/13/2024)   Overall Financial Resource Strain (CARDIA)    Difficulty of Paying Living Expenses: Not hard at all  Food Insecurity: No Food Insecurity (06/13/2024)   Hunger Vital Sign    Worried About Running Out of Food in the Last Year: Never true    Ran Out of Food in the Last Year: Never true  Transportation Needs: No Transportation Needs (06/13/2024)   PRAPARE - Administrator, Civil Service (Medical): No    Lack of Transportation (Non-Medical): No  Physical Activity: Inactive (06/13/2024)   Exercise Vital Sign    Days of Exercise per Week: 0 days    Minutes of Exercise per Session: 0 min  Stress: No Stress Concern Present (06/13/2024)   [employer] of Occupational Health - Occupational Stress Questionnaire    Feeling of Stress: Not at all  Social Connections: Socially Integrated (06/13/2024)   Social Connection and Isolation Panel    Frequency of Communication with Friends and Family: More than three times a week    Frequency of Social Gatherings with Friends and Family: More than three times a week    Attends Religious Services: More than 4 times per year    Active Member of [employer] or Organizations: Yes    Attends Engineer, structural: More than 4 times per year    Marital Status: Married    Tobacco  Counseling Counseling given: Not Answered    Clinical Intake:  Pre-visit preparation completed: Yes  Pain : No/denies pain     BMI - recorded: 32.27 Nutritional Status: BMI > 30  Obese Nutritional Risks: None Diabetes: No  Lab Results  Component Value Date   HGBA1C 5.5 03/25/2021   HGBA1C 5.8 09/18/2020     How often do you need to have someone help you when you read instructions, pamphlets, or other written materials from your doctor or pharmacy?: 1 - Never  Interpreter Needed?: No  Information entered by :: Rojelio Blush LPN   Activities of Daily Living     06/13/2024   10:35 AM 06/13/2024    6:53 AM  In your present state of health, do you have any difficulty performing the following activities:  Hearing? 0 0  Vision? 0 0  Difficulty concentrating or making decisions? 0 0  Walking or climbing stairs? 0 0  Dressing or bathing? 0 0  Doing errands, shopping? 0 0  Preparing Food and eating ? N N  Using the Toilet? N N  In the past six months, have you accidently leaked urine? N N  Do you have problems with loss of bowel control? N N  Managing your Medications? N N  Managing your Finances? N N  Housekeeping or managing your Housekeeping? N N    Patient Care Team: Frann Mabel Mt, DO as PCP - General (Family Medicine) Curvin Mt MOULD, MD as Consulting Physician (General Surgery) Iruku, Praveena, MD as Consulting Physician (Hematology and Oncology) Izell Domino, MD as Attending Physician (Radiation Oncology)  I have updated your Care Teams any recent Medical Services you may have received from other providers in the past year.     Assessment:   This is a routine wellness examination for Lisa Morrison.  Hearing/Vision screen Hearing Screening - Comments:: Denies hearing difficulties   Vision Screening - Comments:: Wears rx glasses - up to date with routine eye exams with  [facility]   Goals  Addressed               This Visit's Progress     Remain  active (pt-stated)         Depression Screen     06/13/2024   10:31 AM 04/21/2024    8:16 AM 10/20/2023    8:04 AM 06/10/2023   10:03 AM 04/20/2023    8:51 AM 01/20/2023   10:32 AM 09/25/2022   11:16 AM  PHQ 2/9 Scores  PHQ - 2 Score 0 0 0 0 0 0 0  PHQ- 9 Score    0 0 0 0    Fall Risk     06/13/2024   10:35 AM 06/13/2024    6:53 AM 04/21/2024    8:16 AM 04/10/2024   11:45 AM 02/07/2024    8:52 AM  Fall Risk   Falls in the past year? 0 0 0 0 0  Number falls in past yr: 0 0 0 0   Injury with Fall? 0 0  0   Risk for fall due to : No Fall Risks No Fall Risks     Follow up Falls evaluation completed Falls evaluation completed       MEDICARE RISK AT HOME:  Medicare Risk at Home Any stairs in or around the home?: Yes If so, are there any without handrails?: No Home free of loose throw rugs in walkways, pet beds, electrical cords, etc?: Yes Adequate lighting in your home to reduce risk of falls?: Yes Life alert?: No Use of a cane, walker or w/c?: No Grab bars in the bathroom?: No Shower chair or bench in shower?: No Elevated toilet seat or a handicapped toilet?: Yes  TIMED UP AND GO:  Was the test performed?  No  Cognitive Function: 6CIT completed        06/13/2024   10:39 AM 06/10/2023   10:07 AM 06/05/2022   10:10 AM  6CIT Screen  What Year? 0 points 0 points 0 points  What month? 0 points 0 points 0 points  What time? 0 points 0 points 0 points  Count back from 20 0 points 0 points 0 points  Months in reverse 0 points 0 points 0 points  Repeat phrase 0 points 0 points 0 points  Total Score 0 points 0 points 0 points    Immunizations Immunization History  Administered Date(s) Administered   INFLUENZA, HIGH DOSE SEASONAL PF 07/06/2018, 06/21/2019, 05/08/2020   Influenza,inj,Quad PF,6+ Mos 06/30/2017   Influenza-Unspecified 06/24/2016, 05/08/2021   Janssen (J&J) SARS-COV-2 Vaccination 05/02/2020, 07/03/2020   PNEUMOCOCCAL CONJUGATE-20 11/23/2020   Pneumococcal  Conjugate-13 06/30/2017   Td 07/28/2023   Tdap 07/28/2023   Zoster Recombinant(Shingrix) 08/16/2018, 11/23/2020   Zoster, Live 09/08/2011    Screening Tests Health Maintenance  Topic Date Due   DEXA SCAN  05/02/2023   Mammogram  05/03/2025   Medicare Annual Wellness (AWV)  06/13/2025   Colonoscopy  12/02/2027   DTaP/Tdap/Td (3 - Td or Tdap) 07/27/2033   Pneumococcal Vaccine: 50+ Years  Completed   Hepatitis C Screening  Completed   Zoster Vaccines- Shingrix  Completed   Meningococcal B Vaccine  Aged Out   Influenza Vaccine  Discontinued   COVID-19 Vaccine  Discontinued    Health Maintenance Items Addressed:   Additional Screening:  Vision Screening: Recommended annual ophthalmology exams for early detection of glaucoma and other disorders of the eye. Is the patient up to date with their annual eye exam?  Yes  Who is the provider  or what is the name of the office in which the patient attends annual eye exams? [facility]  Dental Screening: Recommended annual dental exams for proper oral hygiene  Community Resource Referral / Chronic Care Management: CRR required this visit?  No   CCM required this visit?  No   Plan:    I have personally reviewed and noted the following in the patient's chart:   Medical and social history Use of alcohol, tobacco or illicit drugs  Current medications and supplements including opioid prescriptions. Patient is not currently taking opioid prescriptions. Functional ability and status Nutritional status Physical activity Advanced directives List of other physicians Hospitalizations, surgeries, and ER visits in previous 12 months Vitals Screenings to include cognitive, depression, and falls Referrals and appointments  In addition, I have reviewed and discussed with patient certain preventive protocols, quality metrics, and best practice recommendations. A written personalized care plan for preventive services as well as general  preventive health recommendations were provided to patient.   Rojelio LELON Blush, LPN   [id number]/10/7972   After Visit Summary: (MyChart) Due to this being a telephonic visit, the after visit summary with patients personalized plan was offered to patient via MyChart   Notes: Nothing significant to report at this time.

## 2024-06-14 ENCOUNTER — Ambulatory Visit: Payer: Self-pay

## 2024-06-14 DIAGNOSIS — J3089 Other allergic rhinitis: Secondary | ICD-10-CM | POA: Diagnosis not present

## 2024-06-14 DIAGNOSIS — J3081 Allergic rhinitis due to animal (cat) (dog) hair and dander: Secondary | ICD-10-CM | POA: Diagnosis not present

## 2024-06-14 DIAGNOSIS — J301 Allergic rhinitis due to pollen: Secondary | ICD-10-CM | POA: Diagnosis not present

## 2024-06-14 NOTE — Telephone Encounter (Signed)
 FYI Only or Action Required?: FYI only for provider.  Patient was last seen in primary care on 05/23/2024 by Lisa Mabel Mt, DO.  Called Nurse Triage reporting Wound Infection.  Symptoms began 2 days ago.  Symptoms are: gradually improving.  Triage Disposition: See Physician Within 24 Hours  Patient/caregiver understands and will follow disposition?: Yes    Copied from CRM #[id number]. Topic: Clinical - Red Word Triage >> Jun 14, 2024  3:03 PM Taleah C wrote: Red Word that prompted transfer to Nurse Triage: swelling in left leg, scrapped leg and is infected       Reason for Disposition  [1] Looks infected (e.g., spreading redness, pus) AND [2] no fever  Answer Assessment - Initial Assessment Questions 1. LOCATION: Where is the wound located?      Right leg  2. WOUND APPEARANCE: What does the wound look like?      Pus around wound yesterday, surrounding redness and swelling  3. SIZE: If redness is present, ask: What is the size of the red area? (Inches, centimeters, or compare to size of a coin)      4 inches around it on both sides  4. SPREAD: What's changed in the last day?  Do you see any red streaks coming from the wound?     Wound looks improved from yesterday  5. ONSET: When did it start to look infected?      2 days ago  6. MECHANISM: How did the wound start, what was the cause?     Scraped her leg  7. PAIN: Do you have any pain?  If Yes, ask: How bad is the pain?  (e.g., Scale 1-10; mild, moderate, or severe)     No pain present  8. FEVER: Do you have a fever? If Yes, ask: What is your temperature, how was it measured, and when did it start?     Has not checked  9. OTHER SYMPTOMS: Do you have any other symptoms? (e.g., shaking chills, weakness, rash elsewhere on body)     Chills  Protocols used: Wound Infection Suspected-A-AH

## 2024-06-15 ENCOUNTER — Encounter: Payer: Self-pay | Admitting: Family Medicine

## 2024-06-15 ENCOUNTER — Ambulatory Visit: Admitting: Family Medicine

## 2024-06-15 VITALS — BP 119/65 | HR 80 | Ht 64.0 in | Wt 187.0 lb

## 2024-06-15 DIAGNOSIS — L03115 Cellulitis of right lower limb: Secondary | ICD-10-CM

## 2024-06-15 MED ORDER — CEPHALEXIN 500 MG PO CAPS: 500.0000 mg | ORAL_CAPSULE | Freq: Four times a day (QID) | ORAL | 0 refills | Status: AC

## 2024-06-15 NOTE — Progress Notes (Signed)
 Acute Office Visit  Subjective:     Patient ID: Lisa Morrison, female    DOB: 11/16/1950, 73 y.o.   MRN: [id number]  Chief Complaint  Patient presents with   Wound Check     Patient is in today for leg wound.    Discussed the use of AI scribe software for clinical note transcription with the patient, who gave verbal consent to proceed.  History of Present Illness Lisa Morrison is a 73 year old female with breast cancer on tamoxifen  who presents with swelling and infection of the foot.  She developed swelling and infection in her right leg after sustaining scratches from her rose garden last week. The patient reported swelling around the ankle, as well as redness and warmth. By last night, the patient noted that the area was inflamed and wet.  She experienced fatigue and chills yesterday, which was unusual for her as she typically does not nap during the day. She felt too tired to engage in planned activities and had chills despite being under a blanket. Although she did not take her temperature, she suspected a fever.  She has a history of breast cancer and is currently on tamoxifen , which is monitored closely due to concerns about blood clots. She stopped taking tamoxifen  on Monday due to concerns about the swelling. She has experienced similar swelling episodes in the past, which resolved when she temporarily discontinued tamoxifen .  She attempted to manage the scratches using Band-Aids with a hydrocolloid component, which she believes worsened the infection by drawing out pus and causing skin breakdown. Upon removing the Band-Aids, she noted a significant amount of infection and a foul odor. She then applied triple antibiotic ointment, but it did not adhere well due to the open wounds.  Her medication allergies include amoxicillin and Augmentin, but she has not had reactions to other antibiotics. She is not currently taking any antibiotics but has been using triple antibiotic ointment  on the affected area.         ROS All review of systems negative except what is listed in the HPI      Objective:    BP 119/65   Pulse 80   Ht 5' 4 (1.626 m)   Wt 187 lb (84.8 kg)   SpO2 98%   BMI 32.10 kg/m    Physical Exam Vitals reviewed.  Constitutional:      Appearance: Normal appearance.  Musculoskeletal:     Right lower leg: Edema present.  Skin:    Findings: Erythema present.     Comments: See picture of right lower leg abrasions with surrounding erythema, edema, warmth, tenderness; no purulent drainage  Neurological:     Mental Status: She is alert and oriented to person, place, and time.  Psychiatric:        Mood and Affect: Mood normal.        Behavior: Behavior normal.          No results found for any visits on 06/15/24.      Assessment & Plan:   Problem List Items Addressed This Visit   None Visit Diagnoses       Cellulitis of right lower extremity    -  Primary - Prescribed Keflex  for 5 days. - Continue triple antibiotic ointment. - Use gauze and gauze wrap instead of Band-Aids. - Clean area with warm soapy water twice daily, avoid scrubbing. - Elevate limb to reduce swelling. - Return if no significant improvement by Monday.  Relevant Medications   cephALEXin  (KEFLEX ) 500 MG capsule       Patient aware of signs/symptoms requiring further/urgent evaluation.      Meds ordered this encounter  Medications   cephALEXin  (KEFLEX ) 500 MG capsule    Sig: Take 1 capsule (500 mg total) by mouth 4 (four) times daily for 5 days.    Dispense:  20 capsule    Refill:  0    Supervising Provider:   DOMENICA BLACKBIRD A [[id number]]    Return if symptoms worsen or fail to improve.  Waddell KATHEE Mon, NP

## 2024-06-20 ENCOUNTER — Ambulatory Visit (HOSPITAL_BASED_OUTPATIENT_CLINIC_OR_DEPARTMENT_OTHER)
Admission: RE | Admit: 2024-06-20 | Discharge: 2024-06-20 | Disposition: A | Discharge status: Home / Self Care | Source: Ambulatory Visit | Attending: Family Medicine | Admitting: Family Medicine

## 2024-06-20 DIAGNOSIS — E2839 Other primary ovarian failure: Secondary | ICD-10-CM | POA: Diagnosis not present

## 2024-06-20 DIAGNOSIS — M8589 Other specified disorders of bone density and structure, multiple sites: Secondary | ICD-10-CM | POA: Diagnosis not present

## 2024-06-20 DIAGNOSIS — Z780 Asymptomatic menopausal state: Secondary | ICD-10-CM | POA: Diagnosis not present

## 2024-06-21 ENCOUNTER — Encounter: Payer: Self-pay | Admitting: Family Medicine

## 2024-06-28 DIAGNOSIS — J3089 Other allergic rhinitis: Secondary | ICD-10-CM | POA: Diagnosis not present

## 2024-06-28 DIAGNOSIS — J301 Allergic rhinitis due to pollen: Secondary | ICD-10-CM | POA: Diagnosis not present

## 2024-06-28 DIAGNOSIS — J3081 Allergic rhinitis due to animal (cat) (dog) hair and dander: Secondary | ICD-10-CM | POA: Diagnosis not present

## 2024-06-29 DIAGNOSIS — F341 Dysthymic disorder: Secondary | ICD-10-CM | POA: Diagnosis not present

## 2024-07-04 DIAGNOSIS — J3089 Other allergic rhinitis: Secondary | ICD-10-CM | POA: Diagnosis not present

## 2024-07-04 DIAGNOSIS — J3081 Allergic rhinitis due to animal (cat) (dog) hair and dander: Secondary | ICD-10-CM | POA: Diagnosis not present

## 2024-07-04 DIAGNOSIS — J301 Allergic rhinitis due to pollen: Secondary | ICD-10-CM | POA: Diagnosis not present

## 2024-07-12 DIAGNOSIS — J301 Allergic rhinitis due to pollen: Secondary | ICD-10-CM | POA: Diagnosis not present

## 2024-07-12 DIAGNOSIS — J3089 Other allergic rhinitis: Secondary | ICD-10-CM | POA: Diagnosis not present

## 2024-07-12 DIAGNOSIS — J3081 Allergic rhinitis due to animal (cat) (dog) hair and dander: Secondary | ICD-10-CM | POA: Diagnosis not present

## 2024-07-18 ENCOUNTER — Ambulatory Visit: Admitting: Physician Assistant

## 2024-07-18 ENCOUNTER — Ambulatory Visit: Payer: Self-pay

## 2024-07-18 ENCOUNTER — Encounter: Payer: Self-pay | Admitting: Physician Assistant

## 2024-07-18 VITALS — BP 113/63 | HR 60 | Ht 64.0 in | Wt 187.0 lb

## 2024-07-18 DIAGNOSIS — T63481A Toxic effect of venom of other arthropod, accidental (unintentional), initial encounter: Secondary | ICD-10-CM | POA: Insufficient documentation

## 2024-07-18 MED ORDER — METHYLPREDNISOLONE SODIUM SUCC 125 MG IJ SOLR
125.0000 mg | Freq: Once | INTRAMUSCULAR | Status: AC
  Administered 2024-07-18: 125 mg via INTRAMUSCULAR

## 2024-07-18 NOTE — Telephone Encounter (Signed)
 FYI Only or Action Required?: FYI only for provider: appointment scheduled on 07/18/2024.  Patient was last seen in primary care on 06/15/2024 by Almarie Waddell NOVAK, NP.  Called Nurse Triage reporting Insect Bite.  Symptoms began Saturday.  Interventions attempted: OTC medications: benadryl.  Symptoms are: gradually worsening.  Triage Disposition: See HCP Within 4 Hours (Or PCP Triage)  Patient/caregiver understands and will follow disposition?: Yes    Copied from CRM #[id number]. Topic: Clinical - Red Word Triage >> Jul 18, 2024  9:02 AM Ahlexyia S wrote: Red Word that prompted transfer to Nurse Triage: Pt called in stating she has a bee sting from a yellow jack. Pt stated it is swelling and it has blisters around it. Pt also stated that it is pretty itchy and is taking benadryl. Warm transferred to nurse triage. Reason for Disposition  More than 50 stings  Answer Assessment - Initial Assessment Questions 1. TYPE: What type of sting was it? (e.g., bee, yellow jacket, unknown)      Yellow jacket 2. ONSET: When did it occur?      Saturday 3. LOCATION: Where is the sting located?  How many stings?     Left leg behind knee cap 4. SWELLING SIZE: How big is the swelling? (e.g., inches or cm)     moderate 5. REDNESS: Is the area red or pink? If Yes, ask: What size is area of redness? (e.g., inches or cm). When did the redness start?     yes 6. PAIN: Is there any pain? If Yes, ask: How bad is it?  (Scale 0-10; or none, mild, moderate, severe)     Discomfort/tight 7. ITCHING: Is there any itching? If Yes, ask: How bad is it?      yes 8. RESPIRATORY DISTRESS: Describe your breathing.     Yes but norm 9. PRIOR REACTIONS: Have you had any severe allergic reactions to stings in the past? If Yes, ask: What happened?     yes 10. OTHER SYMPTOMS: Do you have any other symptoms? (e.g., abdomen pain, face or tongue swelling, new rash elsewhere, vomiting)        blister 11. PREGNANCY: Is there any chance you are pregnant? When was your last menstrual period?       na  Protocols used: Bee or Yellow Jacket Sting-A-AH

## 2024-07-18 NOTE — Addendum Note (Signed)
 Addended by: Verlon Carcione A on: 07/18/2024 10:59 AM   Modules accepted: Orders

## 2024-07-18 NOTE — Telephone Encounter (Signed)
 Appt scheduled

## 2024-07-18 NOTE — Progress Notes (Signed)
   Acute Office Visit  Subjective:     Patient ID: OANH DEVIVO, female    DOB: Apr 16, 1951, 73 y.o.   MRN: [id number]  Chief Complaint  Patient presents with   Insect Bite    HPI .SABRADiscussed the use of AI scribe software for clinical note transcription with the patient, who gave verbal consent to proceed.  History of Present Illness Lisa Morrison is a 73 year old female with a history of allergic reactions to insect bites who presents with a reaction to a yellow jacket sting.  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     Objective:    BP 113/63   Pulse 60   Ht 5' 4 (1.626 m)   Wt 187 lb (84.8 kg)   SpO2 99%   BMI 32.10 kg/m  BP Readings from Last 3 Encounters:  07/18/24 113/63  06/15/24 119/65  04/25/24 (!) 144/71   Wt Readings from Last 3 Encounters:  07/18/24 187 lb (84.8 kg)  06/15/24 187 lb (84.8 kg)  06/13/24 188 lb (85.3 kg)      Physical Exam   Central blister  1cm by 1.5cm with surrounding erythema and warmth approximately 5.5cm.     Assessment & Plan:  SABRASABRAAshaki was seen today for insect bite.  Diagnoses and all orders for this visit:  Allergic reaction to insect sting, accidental or unintentional, initial encounter   Assessment & Plan Allergic reaction to insect bite (yellow jacket) Allergic reaction with significant swelling and tightness around the kneecap. Current reaction shows more inflammation than infection. Previous similar reactions with relief from corticosteroid injection and cool compresses. Hydrocortisone spray increased itching, possibly due to sensitivity to something else in the spray.  - Administered corticosteroid injection(Solumedrol 135mcg) for swelling and inflammation. - Recommended cool compresses to reduce swelling. - Advised use of over-the-counter VASHE hydropochloric acid solution for antibacterial effect and to prevent skin infection. - Instructed to keep blisters covered to prevent infection. - Suggested calamine lotion for soothing effect. - Advised to monitor for signs of infection and call if redness persists or worsens.   Keyonda Bickle, PA-C

## 2024-07-18 NOTE — Patient Instructions (Signed)
 VASHE solution to use once a day to keep wounds and bites clean!  Insect Bite, Adult An insect bite can make your skin red, itchy, and swollen. Some insects can spread disease to people with a bite. However, most insect bites do not lead to disease, and most are not serious. What are the causes? Insects may bite for many reasons, including: Hunger. To defend themselves. Insects that bite include: Spiders. Mosquitoes. Flies. Ticks and fleas. Ants. Kissing bugs. Chiggers. What are the signs or symptoms? Symptoms often last for 2-4 days. However, itching can last up to 10 days. Symptoms include: Itching or pain in the bite area. Redness and swelling in the bite area. An open wound. In rare cases, a person may have a very bad allergic reaction (anaphylactic reaction) to a bite. Symptoms of an anaphylactic reaction may include: Feeling warm in the face (flushed). Your face may turn red. Itchy, red, swollen areas of skin (hives). Swelling of the eyes, lips, face, mouth, tongue, or throat. Trouble with breathing, talking, or swallowing. High-pitched whistling sounds, most often when breathing out (wheezing). Feeling dizzy or light-headed. Fainting. Pain or cramps in your belly (abdomen). Vomiting. Watery poop (diarrhea). How is this treated? Most insect bites are not serious. Symptoms often go away on their own. When treatment is advised, it may include: Putting ice on the bite area. Putting a cream or lotion, like calamine lotion, on the bite area. This helps with itching. Using medicines called antihistamines. You may also need: A tetanus shot if you are not up to date. An antibiotic cream or medicine. This treatment is needed if the bite area gets infected. Follow these instructions at home: Bite area care  Do not scratch the bite area. It may help to cover the bite area with a bandage or close-fitting clothing. Keep the bite area clean and dry. Check the bite area every day  for signs of infection. Check for: More redness, swelling, or pain. Fluid or blood. Warmth. Pus or a bad smell. Wash your hands often. Managing pain, itching, and swelling  You may put any of these on the bite area as told by your doctor: A paste made of baking soda and water. Cortisone cream. Calamine lotion. If told, put ice on the bite area. To do this: Put ice in a plastic bag. Place a towel between your skin and the bag. Leave the ice on for 20 minutes, 2-3 times a day. If your skin turns bright red, take off the ice right away to prevent skin damage. The risk of skin damage is higher if you cannot feel pain, heat, or cold. General instructions Apply or take over-the-counter and prescription medicines only as told by your doctor. If you were prescribed antibiotics, take or apply them as told by your doctor. Do not stop using them even if you start to feel better. How is this prevented? To help you have a lower risk of insect bites: When you are outside, wear clothes that cover your arms and legs. Use insect repellent. The best insect repellents contain one of these: DEET. Picaridin. Oil of lemon eucalyptus (OLE). IR3535. Consider spraying your clothing with a pesticide called permethrin. Permethrin helps prevent insect bites. It works for several weeks and for up to 5-6 clothing washes. Do not apply permethrin directly to the skin. If your home windows do not have screens, think about putting some in. If you will be sleeping in an area where there are mosquitoes, consider covering your sleeping area  with a mosquito net. Contact a doctor if: You have redness, swelling, or pain in the bite area. You have fluid or blood coming from the bite area. The bite area feels warm to the touch. You have pus or a bad smell coming from the bite area. You have a fever. Get help right away if: You have joint pain. You have a rash. You feel weak or more tired than you normally do. You  have neck pain or a headache. You have signs of an anaphylactic reaction. Signs may include: Swelling of your eyes, lips, face, mouth, tongue, or throat. Feeling warm in the face. Itchy, red, swollen areas of skin. Trouble with breathing, talking, or swallowing. Wheezing. Feeling dizzy or light-headed. Fainting. Pain or cramps in your belly. Vomiting or watery poop. These symptoms may be an emergency. Get help right away. Call 911. Do not wait to see if symptoms will go away. Do not drive yourself to the hospital. Summary An insect bite can make your skin red, itchy, and swollen. Treatment is usually not needed. Symptoms often go away on their own. Do not scratch the bite area. Keep it clean and dry. Use insect repellent to help prevent insect bites. Contact a doctor if you have signs of infection. This information is not intended to replace advice given to you by your health care provider. Make sure you discuss any questions you have with your health care provider. Document Revised: 11/18/2021 Document Reviewed: 11/18/2021 Elsevier Patient Education  2024 [employer].

## 2024-07-19 ENCOUNTER — Encounter: Payer: Self-pay | Admitting: Family Medicine

## 2024-07-19 DIAGNOSIS — M797 Fibromyalgia: Secondary | ICD-10-CM

## 2024-07-19 DIAGNOSIS — F411 Generalized anxiety disorder: Secondary | ICD-10-CM

## 2024-07-19 MED ORDER — NORTRIPTYLINE HCL 10 MG PO CAPS: 10.0000 mg | ORAL_CAPSULE | Freq: Every day | ORAL | 0 refills | Status: AC

## 2024-07-20 ENCOUNTER — Other Ambulatory Visit: Payer: Self-pay | Admitting: Family Medicine

## 2024-07-26 DIAGNOSIS — J301 Allergic rhinitis due to pollen: Secondary | ICD-10-CM | POA: Diagnosis not present

## 2024-07-26 DIAGNOSIS — J3089 Other allergic rhinitis: Secondary | ICD-10-CM | POA: Diagnosis not present

## 2024-07-26 DIAGNOSIS — J3081 Allergic rhinitis due to animal (cat) (dog) hair and dander: Secondary | ICD-10-CM | POA: Diagnosis not present

## 2024-08-03 ENCOUNTER — Other Ambulatory Visit: Payer: Self-pay | Admitting: Hematology and Oncology

## 2024-08-09 DIAGNOSIS — J301 Allergic rhinitis due to pollen: Secondary | ICD-10-CM | POA: Diagnosis not present

## 2024-08-09 DIAGNOSIS — J3081 Allergic rhinitis due to animal (cat) (dog) hair and dander: Secondary | ICD-10-CM | POA: Diagnosis not present

## 2024-08-09 DIAGNOSIS — J3089 Other allergic rhinitis: Secondary | ICD-10-CM | POA: Diagnosis not present

## 2024-08-11 ENCOUNTER — Other Ambulatory Visit: Payer: Self-pay | Admitting: Family Medicine

## 2024-08-11 DIAGNOSIS — G43109 Migraine with aura, not intractable, without status migrainosus: Secondary | ICD-10-CM

## 2024-08-11 DIAGNOSIS — R519 Headache, unspecified: Secondary | ICD-10-CM

## 2024-08-11 MED ORDER — TOPIRAMATE 50 MG PO TABS: 50.0000 mg | ORAL_TABLET | Freq: Two times a day (BID) | ORAL | 1 refills | Status: AC

## 2024-08-14 ENCOUNTER — Encounter: Payer: Self-pay | Admitting: Adult Health

## 2024-08-14 ENCOUNTER — Ambulatory Visit: Admitting: Adult Health

## 2024-08-14 VITALS — BP 105/70 | HR 78 | Temp 97.7°F | Ht 64.0 in | Wt 191.4 lb

## 2024-08-14 DIAGNOSIS — G4733 Obstructive sleep apnea (adult) (pediatric): Secondary | ICD-10-CM

## 2024-08-14 DIAGNOSIS — Z6832 Body mass index (BMI) 32.0-32.9, adult: Secondary | ICD-10-CM | POA: Diagnosis not present

## 2024-08-14 DIAGNOSIS — J310 Chronic rhinitis: Secondary | ICD-10-CM | POA: Diagnosis not present

## 2024-08-14 DIAGNOSIS — R42 Dizziness and giddiness: Secondary | ICD-10-CM

## 2024-08-14 NOTE — Patient Instructions (Addendum)
 Continue on CPAP At bedtime   Keep up good work.  Work on healthy weight loss Do not drive if sleepy  Meclizine 1/2 tab Twice daily  As needed  for dizziness.  Fluids and rest  Saline nasal spray Twice daily   Saline nasal gel At bedtime   Follow up with PCP for vertigo.  Follow up in 6 months and As needed

## 2024-08-14 NOTE — Progress Notes (Signed)
 @Patient  ID: Lisa Morrison, female    DOB: [birth year], 73 y.o.   MRN: [id number]  Chief Complaint  Patient presents with   Obstructive Sleep Apnea    F/u    Referring provider: Frann Mabel Mt*  HPI: 73 year old female seen for sleep consult December 2024 for snoring and daytime sleepiness found to have mild obstructive sleep apnea    TEST/EVENTS : Reviewed 08/14/2024  home sleep study that was done July 25, 2023. This showed mild sleep apnea with AHI at 7.4/hour and SpO2 low at 79%.    Discussed the use of AI scribe software for clinical note transcription with the patient, who gave verbal consent to proceed.  History of Present Illness Lisa Morrison is a 73 year old female who presents for follow for sleep apnea.   Patient has mild obstructive sleep apnea.  Says she is doing well on CPAP.  Wears her CPAP most every night.  Has had some significant nasal dryness and some nasal bleeding over the last few days and was not able to wear her CPAP.  She states she feels she benefits from CPAP with decreased daytime sleepiness.  CPAP download shows excellent compliance at 83%.  Daily average usage at 6 hours.  She is on auto CPAP 5 to 11 cm H2O.  Daily average pressure at 9.7 cm H2O.  AHI 0.3/hour.  She is using a nasal mask.  [facility] is her DME.  Patient says she has a history of vertigo and inner ear issues this seemed to get worse after she had COVID-19 infection a few years ago.  She is on Xyzal and Singulair daily.  She says over the last few weeks she has been having intermittent episodes of dizziness and lightheadedness.  She has taken meclizine which has helped some.  She denies any speech changes extremity weakness or strokelike symptoms.  No gait like issues.  Has had some episodes where she felt extremely weak   She acknowledges not drinking enough fluids, which may contribute to her symptoms.     Allergies  Allergen Reactions   Augmentin [Amoxicillin-Pot  Clavulanate] Rash    Did it involve swelling of the face/tongue/throat, SOB, or low BP? Yes Did it involve sudden or severe rash/hives, skin peeling, or any reaction on the inside of your mouth or nose? Yes Did you need to seek medical attention at a hospital or doctor's office? Yes When did it last happen? Over 10 years ago If all above answers are NO, may proceed with cephalosporin use.   Demerol  [Meperidine ] Other (See Comments)    Lowers blood pressure    Amoxicillin     Other reaction(s): Loss of consciousness   Clavulanic Acid     Other reaction(s): lowers blood pressure    Immunization History  Administered Date(s) Administered   INFLUENZA, HIGH DOSE SEASONAL PF 07/06/2018, 06/21/2019, 05/08/2020   Influenza,inj,Quad PF,6+ Mos 06/30/2017   Influenza-Unspecified 06/24/2016, 05/08/2021   Janssen (J&J) SARS-COV-2 Vaccination 05/02/2020, 07/03/2020   PNEUMOCOCCAL CONJUGATE-20 11/23/2020   Pneumococcal Conjugate-13 06/30/2017   Td 07/28/2023   Tdap 07/28/2023   Zoster Recombinant(Shingrix) 08/16/2018, 11/23/2020   Zoster, Live 09/08/2011    Past Medical History:  Diagnosis Date   Anxiety    Arthritis    Breast cancer (HCC)    CFS (chronic fatigue syndrome)    Depression    denies now   Family history of adverse reaction to anesthesia    son had n/v   Family history of pancreatic  cancer 05/03/2022   Fibromyalgia    IBS (irritable bowel syndrome)    Leaky heart valve    Migraine    Orthostatic hypotension    from a  previous surgery    PONV (postoperative nausea and vomiting)    and heart rate goes down  I fainted after surgery when standing in the restroom.   Seasonal allergies    Spondylolisthesis of lumbar region     Tobacco History: Social History   Tobacco Use  Smoking Status Never   Passive exposure: Past  Smokeless Tobacco Never   Counseling given: Not Answered   Outpatient Medications Prior to Visit  Medication Sig Dispense Refill    acetaminophen  (TYLENOL ) 650 MG CR tablet Take 1,300 mg by mouth 2 (two) times daily as needed.     albuterol  (VENTOLIN  HFA) 108 (90 Base) MCG/ACT inhaler Inhale 1 puff into the lungs every 4 (four) hours as needed.     Azelastine HCl 137 MCG/SPRAY SOLN Place 1 spray into the nose at bedtime.     Biotin (BIOTIN MAXIMUM STRENGTH) 10 MG TABS Take 30 mg by mouth every morning.     Budeson-Glycopyrrol-Formoterol (BREZTRI  AEROSPHERE) 160-9-4.8 MCG/ACT AERO Inhale 2 puffs into the lungs in the morning and at bedtime. 10.7 g 5   buPROPion  (WELLBUTRIN  SR) 150 MG 12 hr tablet TAKE 1 TABLET BY MOUTH TWICE A DAY 180 tablet 1   levocetirizine (XYZAL) 5 MG tablet  (Patient taking differently: Take 5 mg by mouth every evening.)     lubiprostone  (AMITIZA ) 24 MCG capsule Take 24 mcg by mouth daily.     Magnesium 400 MG TABS Take 400 mg by mouth daily.     meloxicam (MOBIC) 7.5 MG tablet      montelukast (SINGULAIR) 10 MG tablet TAKE 1 TABLET BY MOUTH EVERY DAY IN THE EVENING 90 tablet 2   nortriptyline  (PAMELOR ) 10 MG capsule Take 1-2 capsules (10-20 mg total) by mouth at bedtime. (Patient taking differently: Take 10 mg by mouth 2 (two) times daily.) 180 capsule 0   potassium chloride  (MICRO-K ) 10 MEQ CR capsule TAKE 2 CAPSULES BY MOUTH EVERY DAY 180 capsule 1   pregabalin  (LYRICA ) 150 MG capsule TAKE 1 CAPSULE BY MOUTH EVERY NIGHT 90 capsule 1   topiramate  (TOPAMAX ) 50 MG tablet Take 1 tablet (50 mg total) by mouth 2 (two) times daily. TAKE 1 TABLET BY MOUTH EVERY DAY AT NIGHT 180 tablet 1   triamterene -hydrochlorothiazide  (DYAZIDE ) 37.5-25 MG capsule Take 1 capsule by mouth daily.     tamoxifen  (NOLVADEX ) 20 MG tablet TAKE 1 TABLET BY MOUTH EVERY DAY (Patient not taking: Reported on 08/14/2024) 90 tablet 3   No facility-administered medications prior to visit.     Review of Systems:   Constitutional:   No  weight loss, night sweats,  Fevers, chills, fatigue, or  lassitude.  HEENT:   No headaches,   Difficulty swallowing,  Tooth/dental problems, or  Sore throat,                No sneezing, itching, ear ache, +nasal congestion, post nasal drip, ear fullness   CV:  No chest pain,  Orthopnea, PND, swelling in lower extremities, anasarca, dizziness, palpitations, syncope.   GI  No heartburn, indigestion, abdominal pain, nausea, vomiting, diarrhea, change in bowel habits, loss of appetite, bloody stools.   Resp: No shortness of breath with exertion or at rest.  No excess mucus, no productive cough,  No non-productive cough,  No coughing up of  blood.  No change in color of mucus.  No wheezing.  No chest wall deformity  Skin: no rash or lesions.  GU: no dysuria, change in color of urine, no urgency or frequency.  No flank pain, no hematuria   MS:  No joint pain or swelling.  No decreased range of motion.  No back pain.    Physical Exam  BP 105/70   Pulse 78   Temp 97.7 F (36.5 C)   Ht 5' 4 (1.626 m) Comment: Per pt  Wt 191 lb 6.4 oz (86.8 kg)   SpO2 96% Comment: RA  BMI 32.85 kg/m   GEN: A/Ox3; pleasant , NAD, well nourished    HEENT:  [place]/AT,  EACs-clear, TMs-wnl, NOSE-clear, THROAT-clear, no lesions, no postnasal drip or exudate noted.   NECK:  Supple w/ fair ROM; no JVD; normal carotid impulses w/o bruits; no thyromegaly or nodules palpated; no lymphadenopathy.    RESP  Clear  P & A; w/o, wheezes/ rales/ or rhonchi. no accessory muscle use, no dullness to percussion  CARD:  RRR, no m/r/g, no peripheral edema, pulses intact, no cyanosis or clubbing.  GI:   Soft & nt; nml bowel sounds; no organomegaly or masses detected.   Musco: Warm bil, no deformities or joint swelling noted.   Neuro: alert, no focal deficits noted.  Patient features are symmetrical.  No extremity weakness, normal hand grips, normal gait  Skin: Warm, no lesions or rashes    Lab Results:Reviewed 08/14/2024   CBC    Component Value Date/Time   WBC 5.1 04/21/2024 0902   RBC 4.64 04/21/2024 0902    HGB 14.5 04/21/2024 0902   HGB 15.5 (H) 04/29/2022 1222   HCT 43.8 04/21/2024 0902   PLT 269.0 04/21/2024 0902   PLT 269 04/29/2022 1222   MCV 94.4 04/21/2024 0902   MCH 31.2 04/29/2022 1222   MCHC 33.0 04/21/2024 0902   RDW 13.5 04/21/2024 0902   LYMPHSABS 1.5 04/29/2022 1222   MONOABS 0.4 04/29/2022 1222   EOSABS 0.1 04/29/2022 1222   BASOSABS 0.1 04/29/2022 1222    BMET    Component Value Date/Time   NA 144 04/21/2024 0902   K 4.4 04/21/2024 0902   CL 104 04/21/2024 0902   CO2 32 04/21/2024 0902   GLUCOSE 98 04/21/2024 0902   BUN 17 04/21/2024 0902   CREATININE 0.85 04/21/2024 0902   CREATININE 0.75 04/29/2022 1222   CREATININE 0.85 01/07/2022 1049   CALCIUM 9.6 04/21/2024 0902   GFRNONAA >60 04/29/2022 1222   GFRAA >60 03/16/2019 1137    BNP No results found for: BNP  ProBNP No results found for: PROBNP  Imaging: No results found.  methylPREDNISolone  sodium succinate (SOLU-MEDROL ) 125 mg/2 mL injection 125 mg     Date Action Dose Route User   07/18/2024 1050 Given 125 mg Intramuscular (Left Upper Outer Quadrant) Zackary Session A, CMA           No data to display          No results found for: NITRICOXIDE      No data to display              Assessment & Plan:   Assessment and Plan Assessment & Plan Obstructive sleep apnea  -excellent control compliance on nocturnal CPAP.  She has perceived benefit. Her obstructive sleep apnea is well-controlled with CPAP therapy,  Continue CPAP therapy with current settings. Use saline gel and nasal spray to manage nasal dryness.  CPAP  care discussed in detail  Vertigo  -mild intermittent vertigo. She reports dizziness and lightheadedness for several weeks, with a history of inner ear trouble. Symptoms include a sensation of potential fainting. No active ear infection noted on exam.  Neuroexam unrevealing.  Meclizine has been effective in the past. Take meclizine 12.5 mg twice daily.  Increase fluid intake, including electrolyte fluids like Body Armor. Change positions slowly to prevent dizziness. Follow up with primary care physician,for further evaluation.Please contact office for sooner follow up if symptoms do not improve or worsen or seek emergency care    Nasal dryness and epistaxis   She experiences nasal dryness and occasional epistaxis, likely due to dry nasal membranes exacerbated by CPAP use and environmental factors. No active infection is noted. Use saline nasal gel at night and saline nasal spray twice daily. Ensure the CPAP water chamber is cleaned and use distilled water. Increase fluid intake to maintain hydration.  Chronic Allergic rhinitis   Her allergic rhinitis is currently managed with Claritin and Singulair. No additional nasal sprays are recommended to avoid irritation and potential epistaxis. Continue current allergy medications (Claritin and Singulair). Use saline nasal spray instead of additional nasal sprays.        Madelin Stank, NP 08/14/2024

## 2024-08-24 ENCOUNTER — Other Ambulatory Visit: Payer: Self-pay | Admitting: Family Medicine

## 2024-08-24 ENCOUNTER — Encounter: Payer: Self-pay | Admitting: Family Medicine

## 2024-08-25 ENCOUNTER — Other Ambulatory Visit: Payer: Self-pay | Admitting: Family Medicine

## 2024-08-25 MED ORDER — PREGABALIN 150 MG PO CAPS: ORAL_CAPSULE | ORAL | 1 refills | Status: AC

## 2024-08-25 NOTE — Addendum Note (Signed)
 Addended by: FRANN MABEL SQUIBB on: 08/25/2024 01:52 PM   Modules accepted: Orders

## 2024-09-05 ENCOUNTER — Other Ambulatory Visit (HOSPITAL_COMMUNITY): Payer: Self-pay

## 2024-09-13 ENCOUNTER — Telehealth

## 2024-09-19 ENCOUNTER — Telehealth: Payer: Self-pay

## 2024-09-19 ENCOUNTER — Other Ambulatory Visit (HOSPITAL_COMMUNITY): Payer: Self-pay

## 2024-09-19 NOTE — Telephone Encounter (Signed)
 Pharmacy Patient Advocate Encounter   Received notification from [facility] that prior authorization for [facility]  is required/requested.   Insurance verification completed.   The patient is insured through [employer].   Per test claim: The current 30 day co-pay is, $$354.11.  No PA needed at this time. This test claim was processed through [facility]- copay amounts may vary at other pharmacies due to pharmacy/plan contracts, or as the patient moves through the different stages of their insurance plan.

## 2024-10-24 ENCOUNTER — Ambulatory Visit: Admitting: Family Medicine

## 2025-06-21 ENCOUNTER — Ambulatory Visit
